# Patient Record
Sex: Female | Born: 1950 | Race: White | Hispanic: No | Marital: Married | Smoking: Former smoker
Health system: Southern US, Community
[De-identification: ages and names within clinical notes are randomized; demographics above are authoritative.]

## PROBLEM LIST (undated history)

## (undated) DIAGNOSIS — I10 Essential (primary) hypertension: Secondary | ICD-10-CM

## (undated) DIAGNOSIS — M5430 Sciatica, unspecified side: Secondary | ICD-10-CM

## (undated) DIAGNOSIS — K219 Gastro-esophageal reflux disease without esophagitis: Secondary | ICD-10-CM

## (undated) DIAGNOSIS — M5136 Other intervertebral disc degeneration, lumbar region: Secondary | ICD-10-CM

## (undated) DIAGNOSIS — Z8489 Family history of other specified conditions: Secondary | ICD-10-CM

## (undated) DIAGNOSIS — M51369 Other intervertebral disc degeneration, lumbar region without mention of lumbar back pain or lower extremity pain: Secondary | ICD-10-CM

## (undated) DIAGNOSIS — N814 Uterovaginal prolapse, unspecified: Secondary | ICD-10-CM

## (undated) HISTORY — PX: ABDOMINAL HYSTERECTOMY: SHX81

## (undated) HISTORY — DX: Sciatica, unspecified side: M54.30

## (undated) HISTORY — PX: COLONOSCOPY: SHX174

## (undated) HISTORY — DX: Other intervertebral disc degeneration, lumbar region without mention of lumbar back pain or lower extremity pain: M51.369

## (undated) HISTORY — PX: OOPHORECTOMY: SHX86

## (undated) HISTORY — DX: Essential (primary) hypertension: I10

## (undated) HISTORY — PX: OVARIAN CYST REMOVAL: SHX89

## (undated) HISTORY — PX: TOTAL ABDOMINAL HYSTERECTOMY W/ BILATERAL SALPINGOOPHORECTOMY: SHX83

## (undated) HISTORY — PX: TUBAL LIGATION: SHX77

## (undated) HISTORY — DX: Other intervertebral disc degeneration, lumbar region: M51.36

---

## 2008-06-17 ENCOUNTER — Ambulatory Visit: Payer: Self-pay | Admitting: Internal Medicine

## 2008-12-22 ENCOUNTER — Ambulatory Visit: Payer: Self-pay | Admitting: Family Medicine

## 2009-01-25 ENCOUNTER — Ambulatory Visit: Payer: Self-pay | Admitting: Family Medicine

## 2011-02-02 ENCOUNTER — Ambulatory Visit: Payer: Self-pay | Admitting: Family Medicine

## 2011-05-16 HISTORY — PX: CYSTOSCOPY: SUR368

## 2012-04-25 ENCOUNTER — Ambulatory Visit: Payer: Self-pay | Admitting: Family Medicine

## 2012-10-10 ENCOUNTER — Ambulatory Visit: Payer: Self-pay

## 2012-11-04 ENCOUNTER — Ambulatory Visit: Payer: Self-pay | Admitting: Emergency Medicine

## 2013-07-09 ENCOUNTER — Ambulatory Visit: Payer: Self-pay | Admitting: Anesthesiology

## 2013-08-20 ENCOUNTER — Ambulatory Visit: Payer: Self-pay | Admitting: Family Medicine

## 2014-01-05 LAB — HM PAP SMEAR: HM PAP: NORMAL

## 2014-01-15 ENCOUNTER — Ambulatory Visit: Payer: Self-pay | Admitting: Family Medicine

## 2014-01-21 ENCOUNTER — Ambulatory Visit: Payer: Self-pay | Admitting: Family Medicine

## 2014-03-03 LAB — HM COLONOSCOPY

## 2014-09-05 NOTE — H&P (Signed)
PATIENT NAME:  Makayla [name], Makayla [name] MR#:  [id number] DATE OF BIRTH:  1950/09/24  DATE OF ADMISSION:  07/09/2013  CHIEF COMPLAINT: Cervical neck pain and bilateral shoulder pain.   PROCEDURES: None.   HISTORY OF PRESENT ILLNESS: [name] is a pleasant 64 year old white female with long-standing history of cervical neck pain for the past 15 years that has gradually gotten worse. She has been referred for evaluation and management and complains of pain that has gradually gotten worse in intensity to the point she sought medical attention. It does not appear to be influenced by time of day and is aggravated by bending, lifting, sitting, and working. Alleviating factors include lying down and medication management, for which she takes ibuprofen occasionally 800 mg t.i.d. p.r.n. She has associated spasms in the bilateral cervical region and trapezius region, and occasionally paroxysmal neck jerking sensations when she is in a prolonged seated position. The pain is described as an aching, annoying, burning, constant, pain that is pressure-like in sensation, but does not appear to radiate into the lower extremities other than some left fifth finger weakness. The pain is tiring and uncomfortable, and she has had a previous MRI that shows multilevel degenerative disk disease with some spur formation noted at multiple levels, most notably at C4-C5 with a disk osteophyte, C5-C6 with a right paracentral disk osteophyte, and C6-C7 with a left lateral disk osteophyte.   PAST MEDICAL HISTORY: Significant for daily aspirin and high blood pressure. Negative for pulmonary, neurologic, psychologic GI. GU with a history of blood in the urine, without significant problems now.   SOCIAL HISTORY: She is married with 4 children. Quit smoking 5 years ago. She works Investment banker, corporate houses.   PAST SURGICAL HISTORY: Includes a tubal ligation and ovarian cystectomy.   CURRENT MEDICATIONS: Include lisinopril, aspirin,  ibuprofen.   PHYSICAL EXAMINATION:  VITAL SIGNS: Temperature 98, blood pressure 121/85, pulse 74, regular respirations of 14, O2 sat 96%.  HEART: Regular rate and rhythm.  LUNGS: Clear to auscultation.  MUSCULOSKELETAL: Inspection of the neck reveals some mild tenderness with atlantooccipital flexion and extension, but worse with lateral adduction to the left and right. She has some bilateral paraspinous muscle tenderness in the cervical region as well as into the trapezius, but no noted trigger points. Strength appears to be well preserved throughout the upper extremities to flexion and extension of the biceps, triceps, wrists, and lumbricals of the hands. Her hand grasp strength appears to be appropriate. Sensation appears to be grossly intact.   ASSESSMENT: 1.  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  2.  Hypertension, on aspirin.   PLAN:  1.  I had a long discussion with Ms. Mankins, and I think she would be a candidate for cervical epidural steroid injections. We have gone over the risks and benefits of the procedure with her in full detail. I have talked to her about the benefits of the injection, that she is likely to receive possibly 50% to 75% with injection, though  some people do not receive any relief at all. She understands this. We will give her some literature on it as well, and I have answered all her questions today. We will have her do this at the next available date. I am going to have her discontinue her aspirin in the meantime. We will also write her a prescription for Celebrex 200 mg to be taken 1 tablet once a day for the next month to see if this can  give her some relief with her pain. She is to discontinue the ibuprofen or Naprosyn in the meantime as well.  2.  We have talked about options regarding physical therapy, and we may consider this as well.    ____________________________ Alvina Filbert. Andree Elk, MD jga:jcm D: 07/09/2013  15:59:35 ET T: 07/09/2013 16:45:05 ET JOB#: [id number]  cc: Alvina Filbert. Andree Elk, MD, <Dictator> [place], MD Alvina Filbert ADAMS MD ELECTRONICALLY SIGNED 07/11/2013 13:12

## 2015-04-13 ENCOUNTER — Other Ambulatory Visit: Payer: Self-pay | Admitting: Family Medicine

## 2015-04-13 DIAGNOSIS — Z1231 Encounter for screening mammogram for malignant neoplasm of breast: Secondary | ICD-10-CM

## 2015-04-21 ENCOUNTER — Ambulatory Visit
Admission: RE | Admit: 2015-04-21 | Discharge: 2015-04-21 | Disposition: A | Discharge status: Home / Self Care | Payer: BC Managed Care – PPO | Source: Ambulatory Visit | Attending: Family Medicine | Admitting: Family Medicine

## 2015-04-21 DIAGNOSIS — Z1231 Encounter for screening mammogram for malignant neoplasm of breast: Secondary | ICD-10-CM | POA: Insufficient documentation

## 2015-06-04 ENCOUNTER — Other Ambulatory Visit: Payer: Self-pay | Admitting: Physical Medicine and Rehabilitation

## 2015-06-04 DIAGNOSIS — M5416 Radiculopathy, lumbar region: Secondary | ICD-10-CM

## 2015-06-22 ENCOUNTER — Ambulatory Visit
Admission: RE | Admit: 2015-06-22 | Discharge: 2015-06-22 | Disposition: A | Discharge status: Home / Self Care | Payer: BC Managed Care – PPO | Source: Ambulatory Visit | Attending: Physical Medicine and Rehabilitation | Admitting: Physical Medicine and Rehabilitation

## 2015-06-22 DIAGNOSIS — M5416 Radiculopathy, lumbar region: Secondary | ICD-10-CM | POA: Diagnosis present

## 2015-06-22 DIAGNOSIS — M5136 Other intervertebral disc degeneration, lumbar region: Secondary | ICD-10-CM | POA: Diagnosis not present

## 2015-06-22 DIAGNOSIS — M4726 Other spondylosis with radiculopathy, lumbar region: Secondary | ICD-10-CM | POA: Diagnosis not present

## 2016-02-28 ENCOUNTER — Other Ambulatory Visit: Payer: Self-pay | Admitting: Family Medicine

## 2016-02-28 DIAGNOSIS — Z1231 Encounter for screening mammogram for malignant neoplasm of breast: Secondary | ICD-10-CM

## 2016-04-24 ENCOUNTER — Ambulatory Visit
Admission: RE | Admit: 2016-04-24 | Discharge: 2016-04-24 | Disposition: A | Discharge status: Home / Self Care | Payer: BC Managed Care – PPO | Source: Ambulatory Visit | Attending: Family Medicine | Admitting: Family Medicine

## 2016-04-24 DIAGNOSIS — Z1231 Encounter for screening mammogram for malignant neoplasm of breast: Secondary | ICD-10-CM | POA: Insufficient documentation

## 2017-03-23 ENCOUNTER — Ambulatory Visit: Payer: BC Managed Care – PPO | Admitting: Family Medicine

## 2017-03-23 ENCOUNTER — Encounter: Payer: Self-pay | Admitting: Family Medicine

## 2017-03-23 VITALS — BP 129/90 | HR 71 | Temp 98.0°F | Ht 66.0 in | Wt 162.2 lb

## 2017-03-23 DIAGNOSIS — M545 Low back pain: Secondary | ICD-10-CM

## 2017-03-23 DIAGNOSIS — Z1231 Encounter for screening mammogram for malignant neoplasm of breast: Secondary | ICD-10-CM | POA: Diagnosis not present

## 2017-03-23 DIAGNOSIS — I10 Essential (primary) hypertension: Secondary | ICD-10-CM

## 2017-03-23 DIAGNOSIS — M5441 Lumbago with sciatica, right side: Secondary | ICD-10-CM

## 2017-03-23 DIAGNOSIS — G8929 Other chronic pain: Secondary | ICD-10-CM

## 2017-03-23 DIAGNOSIS — Z1239 Encounter for other screening for malignant neoplasm of breast: Secondary | ICD-10-CM

## 2017-03-23 DIAGNOSIS — I129 Hypertensive chronic kidney disease with stage 1 through stage 4 chronic kidney disease, or unspecified chronic kidney disease: Secondary | ICD-10-CM | POA: Insufficient documentation

## 2017-03-23 NOTE — Assessment & Plan Note (Signed)
Discussed cymbalta and lidocaine patches. Stretches given. Continue to follow with Dr. Sharlet Salina. Call with any concerns.

## 2017-03-23 NOTE — Patient Instructions (Addendum)
Trochanteric Bursitis Rehab Ask your health care provider which exercises are safe for you. Do exercises exactly as told by your health care provider and adjust them as directed. It is normal to feel mild stretching, pulling, tightness, or discomfort as you do these exercises, but you should stop right away if you feel sudden pain or your pain gets worse.Do not begin these exercises until told by your health care provider. Stretching exercises These exercises warm up your muscles and joints and improve the movement and flexibility of your hip. These exercises also help to relieve pain and stiffness. Exercise A: Iliotibial band stretch  1. Lie on your side with your left / right leg in the top position. 2. Bend your left / right knee and grab your ankle. 3. Slowly bring your knee back so your thigh is behind your body. 4. Slowly lower your knee toward the floor until you feel a gentle stretch on the outside of your left / right thigh. If you do not feel a stretch and your knee will not fall farther, place the heel of your other foot on top of your outer knee and pull your thigh down farther. 5. Hold this position for __________ seconds. 6. Slowly return to the starting position. Repeat __________ times. Complete this exercise __________ times a day. Strengthening exercises These exercises build strength and endurance in your hip and pelvis. Endurance is the ability to use your muscles for a long time, even after they get tired. Exercise B: Bridge ( hip extensors) 1. Lie on your back on a firm surface with your knees bent and your feet flat on the floor. 2. Tighten your buttocks muscles and lift your buttocks off the floor until your trunk is level with your thighs. You should feel the muscles working in your buttocks and the back of your thighs. If this exercise is too easy, try doing it with your arms crossed over your chest. 3. Hold this position for __________ seconds. 4. Slowly return to the  starting position. 5. Let your muscles relax completely between repetitions. Repeat __________ times. Complete this exercise __________ times a day. Exercise C: Squats ( knee extensors and  quadriceps) 1. Stand in front of a table, with your feet and knees pointing straight ahead. You may rest your hands on the table for balance but not for support. 2. Slowly bend your knees and lower your hips like you are going to sit in a chair. ? Keep your weight over your heels, not over your toes. ? Keep your lower legs upright so they are parallel with the table legs. ? Do not let your hips go lower than your knees. ? Do not bend lower than told by your health care provider. ? If your hip pain increases, do not bend as low. 3. Hold this position for __________ seconds. 4. Slowly push with your legs to return to standing. Do not use your hands to pull yourself to standing. Repeat __________ times. Complete this exercise __________ times a day. Exercise D: Hip hike 1. Stand sideways on a bottom step. Stand on your left / right leg with your other foot unsupported next to the step. You can hold onto the railing or wall if needed for balance. 2. Keeping your knees straight and your torso square, lift your left / right hip up toward the ceiling. 3. Hold this position for __________ seconds. 4. Slowly let your left / right hip lower toward the floor, past the starting position. Your foot   should get closer to the floor. Do not lean or bend your knees. Repeat __________ times. Complete this exercise __________ times a day. Exercise E: Single leg stand 1. Stand near a counter or door frame that you can hold onto for balance as needed. It is helpful to stand in front of a mirror for this exercise so you can watch your hip. 2. Squeeze your left / right buttock muscles then lift up your other foot. Do not let your left / right hip push out to the side. 3. Hold this position for __________ seconds. Repeat  __________ times. Complete this exercise __________ times a day. This information is not intended to replace advice given to you by your health care provider. Make sure you discuss any questions you have with your health care provider. Document Released: 06/08/2004 Document Revised: 01/06/2016 Document Reviewed: 04/16/2015 Elsevier Interactive Patient Education  2018 [employer].  Sciatica Rehab Ask your health care provider which exercises are safe for you. Do exercises exactly as told by your health care provider and adjust them as directed. It is normal to feel mild stretching, pulling, tightness, or discomfort as you do these exercises, but you should stop right away if you feel sudden pain or your pain gets worse.Do not begin these exercises until told by your health care provider. Stretching and range of motion exercises These exercises warm up your muscles and joints and improve the movement and flexibility of your hips and your back. These exercises also help to relieve pain, numbness, and tingling. Exercise A: Sciatic nerve glide 7. Sit in a chair with your head facing down toward your chest. Place your hands behind your back. Let your shoulders slump forward. 8. Slowly straighten one of your knees while you tilt your head back as if you are looking toward the ceiling. Only straighten your leg as far as you can without making your symptoms worse. 9. Hold for __________ seconds. 10. Slowly return to the starting position. 11. Repeat with your other leg. Repeat __________ times. Complete this exercise __________ times a day. Exercise B: Knee to chest with hip adduction and internal rotation  1. Lie on your back on a firm surface with both legs straight. 2. Bend one of your knees and move it up toward your chest until you feel a gentle stretch in your lower back and buttock. Then, move your knee toward the shoulder that is on the opposite side from your leg. ? Hold your leg in this  position by holding onto the front of your knee. 3. Hold for __________ seconds. 4. Slowly return to the starting position. 5. Repeat with your other leg. Repeat __________ times. Complete this exercise __________ times a day. Exercise C: Prone extension on elbows  5. Lie on your abdomen on a firm surface. A bed may be too soft for this exercise. 6. Prop yourself up on your elbows. 7. Use your arms to help lift your chest up until you feel a gentle stretch in your abdomen and your lower back. ? This will place some of your body weight on your elbows. If this is uncomfortable, try stacking pillows under your chest. ? Your hips should stay down, against the surface that you are lying on. Keep your hip and back muscles relaxed. 8. Hold for __________ seconds. 9. Slowly relax your upper body and return to the starting position. Repeat __________ times. Complete this exercise __________ times a day. Strengthening exercises These exercises build strength and endurance in your  back. Endurance is the ability to use your muscles for a long time, even after they get tired. Exercise D: Pelvic tilt 1. Lie on your back on a firm surface. Bend your knees and keep your feet flat. 2. Tense your abdominal muscles. Tip your pelvis up toward the ceiling and flatten your lower back into the floor. ? To help with this exercise, you may place a small towel under your lower back and try to push your back into the towel. 3. Hold for __________ seconds. 4. Let your muscles relax completely before you repeat this exercise. Repeat __________ times. Complete this exercise __________ times a day. Exercise E: Alternating arm and leg raises  1. Get on your hands and knees on a firm surface. If you are on a hard floor, you may want to use padding to cushion your knees, such as an exercise mat. 2. Line up your arms and legs. Your hands should be below your shoulders, and your knees should be below your hips. 3. Lift your  left leg behind you. At the same time, raise your right arm and straighten it in front of you. ? Do not lift your leg higher than your hip. ? Do not lift your arm higher than your shoulder. ? Keep your abdominal and back muscles tight. ? Keep your hips facing the ground. ? Do not arch your back. ? Keep your balance carefully, and do not hold your breath. 4. Hold for __________ seconds. 5. Slowly return to the starting position and repeat with your right leg and your left arm. Repeat __________ times. Complete this exercise __________ times a day. Posture and body mechanics  Body mechanics refers to the movements and positions of your body while you do your daily activities. Posture is part of body mechanics. Good posture and healthy body mechanics can help to relieve stress in your body's tissues and joints. Good posture means that your spine is in its natural S-curve position (your spine is neutral), your shoulders are pulled back slightly, and your head is not tipped forward. The following are general guidelines for applying improved posture and body mechanics to your everyday activities. Standing   When standing, keep your spine neutral and your feet about hip-width apart. Keep a slight bend in your knees. Your ears, shoulders, and hips should line up.  When you do a task in which you stand in one place for a long time, place one foot up on a stable object that is 2-4 inches (5-10 cm) high, such as a footstool. This helps keep your spine neutral. Sitting   When sitting, keep your spine neutral and keep your feet flat on the floor. Use a footrest, if necessary, and keep your thighs parallel to the floor. Avoid rounding your shoulders, and avoid tilting your head forward.  When working at a desk or a computer, keep your desk at a height where your hands are slightly lower than your elbows. Slide your chair under your desk so you are close enough to maintain good posture.  When working at a  computer, place your monitor at a height where you are looking straight ahead and you do not have to tilt your head forward or downward to look at the screen. Resting   When lying down and resting, avoid positions that are most painful for you.  If you have pain with activities such as sitting, bending, stooping, or squatting (flexion-based activities), lie in a position in which your body does not bend  very much. For example, avoid curling up on your side with your arms and knees near your chest (fetal position).  If you have pain with activities such as standing for a long time or reaching with your arms (extension-based activities), lie with your spine in a neutral position and bend your knees slightly. Try the following positions: ? Lying on your side with a pillow between your knees. ? Lying on your back with a pillow under your knees. Lifting   When lifting objects, keep your feet at least shoulder-width apart and tighten your abdominal muscles.  Bend your knees and hips and keep your spine neutral. It is important to lift using the strength of your legs, not your back. Do not lock your knees straight out.  Always ask for help to lift heavy or awkward objects. This information is not intended to replace advice given to you by your health care provider. Make sure you discuss any questions you have with your health care provider. Document Released: 05/01/2005 Document Revised: 01/06/2016 Document Reviewed: 01/15/2015 Elsevier Interactive Patient Education  2018 [employer]. Duloxetine delayed-release capsules What is this medicine? DULOXETINE (doo LOX e teen) is used to treat depression, anxiety, and different types of chronic pain. This medicine may be used for other purposes; ask your health care provider or pharmacist if you have questions. COMMON BRAND NAME(S): Cymbalta, Irenka What should I tell my health care provider before I take this medicine? They need to know if you have  any of these conditions: -bipolar disorder or a family history of bipolar disorder -glaucoma -kidney disease -liver disease -suicidal thoughts or a previous suicide attempt -taken medicines called MAOIs like Carbex, Eldepryl, Marplan, Nardil, and Parnate within 14 days -an unusual reaction to duloxetine, other medicines, foods, dyes, or preservatives -pregnant or trying to get pregnant -breast-feeding How should I use this medicine? Take this medicine by mouth with a glass of water. Follow the directions on the prescription label. Do not cut, crush or chew this medicine. You can take this medicine with or without food. Take your medicine at regular intervals. Do not take your medicine more often than directed. Do not stop taking this medicine suddenly except upon the advice of your doctor. Stopping this medicine too quickly may cause serious side effects or your condition may worsen. A special MedGuide will be given to you by the pharmacist with each prescription and refill. Be sure to read this information carefully each time. Talk to your pediatrician regarding the use of this medicine in children. While this drug may be prescribed for children as young as 10 years of age for selected conditions, precautions do apply. Overdosage: If you think you have taken too much of this medicine contact a poison control center or emergency room at once. NOTE: This medicine is only for you. Do not share this medicine with others. What if I miss a dose? If you miss a dose, take it as soon as you can. If it is almost time for your next dose, take only that dose. Do not take double or extra doses. What may interact with this medicine? Do not take this medicine with any of the following medications: -desvenlafaxine -levomilnacipran -linezolid -MAOIs like Carbex, Eldepryl, Marplan, Nardil, and Parnate -methylene blue (injected into a vein) -milnacipran -thioridazine -venlafaxine This medicine may also  interact with the following medications: -alcohol -amphetamines -aspirin and aspirin-like medicines -certain antibiotics like ciprofloxacin and enoxacin -certain medicines for blood pressure, heart disease, irregular heart beat -certain medicines  for depression, anxiety, or psychotic disturbances -certain medicines for migraine headache like almotriptan, eletriptan, frovatriptan, naratriptan, rizatriptan, sumatriptan, zolmitriptan -certain medicines that treat or prevent blood clots like warfarin, enoxaparin, and dalteparin -cimetidine -fentanyl -lithium -NSAIDS, medicines for pain and inflammation, like ibuprofen or naproxen -phentermine -procarbazine -rasagiline -sibutramine -St. John's wort -theophylline -tramadol -tryptophan This list may not describe all possible interactions. Give your health care provider a list of all the medicines, herbs, non-prescription drugs, or dietary supplements you use. Also tell them if you smoke, drink alcohol, or use illegal drugs. Some items may interact with your medicine. What should I watch for while using this medicine? Tell your doctor if your symptoms do not get better or if they get worse. Visit your doctor or health care professional for regular checks on your progress. Because it may take several weeks to see the full effects of this medicine, it is important to continue your treatment as prescribed by your doctor. Patients and their families should watch out for new or worsening thoughts of suicide or depression. Also watch out for sudden changes in feelings such as feeling anxious, agitated, panicky, irritable, hostile, aggressive, impulsive, severely restless, overly excited and hyperactive, or not being able to sleep. If this happens, especially at the beginning of treatment or after a change in dose, call your health care professional. Dennis Bast may get drowsy or dizzy. Do not drive, use machinery, or do anything that needs mental alertness until  you know how this medicine affects you. Do not stand or sit up quickly, especially if you are an older patient. This reduces the risk of dizzy or fainting spells. Alcohol may interfere with the effect of this medicine. Avoid alcoholic drinks. This medicine can cause an increase in blood pressure. This medicine can also cause a sudden drop in your blood pressure, which may make you feel faint and increase the chance of a fall. These effects are most common when you first start the medicine or when the dose is increased, or during use of other medicines that can cause a sudden drop in blood pressure. Check with your doctor for instructions on monitoring your blood pressure while taking this medicine. Your mouth may get dry. Chewing sugarless gum or sucking hard candy, and drinking plenty of water may help. Contact your doctor if the problem does not go away or is severe. What side effects may I notice from receiving this medicine? Side effects that you should report to your doctor or health care professional as soon as possible: -allergic reactions like skin rash, itching or hives, swelling of the face, lips, or tongue -anxious -breathing problems -confusion -changes in vision -chest pain -confusion -elevated mood, decreased need for sleep, racing thoughts, impulsive behavior -eye pain -fast, irregular heartbeat -feeling faint or lightheaded, falls -feeling agitated, angry, or irritable -hallucination, loss of contact with reality -high blood pressure -loss of balance or coordination -palpitations -redness, blistering, peeling or loosening of the skin, including inside the mouth -restlessness, pacing, inability to keep still -seizures -stiff muscles -suicidal thoughts or other mood changes -trouble passing urine or change in the amount of urine -trouble sleeping -unusual bleeding or bruising -unusually weak or tired -vomiting -yellowing of the eyes or skin Side effects that usually do  not require medical attention (report to your doctor or health care professional if they continue or are bothersome): -change in sex drive or performance -change in appetite or weight -constipation -dizziness -dry mouth -headache -increased sweating -nausea -tired This list may not describe  all possible side effects. Call your doctor for medical advice about side effects. You may report side effects to FDA at 1-800-FDA-1088. Where should I keep my medicine? Keep out of the reach of children. Store at room temperature between 20 and 25 degrees C (68 to 77 degrees F). Throw away any unused medicine after the expiration date. NOTE: This sheet is a summary. It may not cover all possible information. If you have questions about this medicine, talk to your doctor, pharmacist, or health care provider.  2018 Elsevier/Gold Standard (2015-09-30 18:16:03)

## 2017-03-23 NOTE — Assessment & Plan Note (Signed)
Under good control. Doesn't need refill today. Continue current regimen. Continue to monitor. Call with any concerns.

## 2017-03-23 NOTE — Progress Notes (Signed)
BP 129/90 (BP Location: Left Arm, Patient Position: Sitting, Cuff Size: Normal)   Pulse 71   Temp 98 F (36.7 C)   Ht 5\' 6"  (1.676 m)   Wt 162 lb 4 oz (73.6 kg)   SpO2 98%   BMI 26.19 kg/m    Subjective:    Patient ID: KALISA GIRTMAN, female    DOB: [birth year], 66 y.o.   MRN: [id number]  HPI: TULEEN MANDELBAUM is a 66 y.o. female who presents today to establish care.   Chief Complaint  Patient presents with  . Establish Care  . Hypertension   HYPERTENSION Hypertension status: controlled  Satisfied with current treatment? yes Duration of hypertension: chronic BP monitoring frequency:  not checking BP medication side effects:  no Medication compliance: excellent compliance Previous BP meds: lisinopril Aspirin: no Recurrent headaches: no Visual changes: no Palpitations: no Dyspnea: no Chest pain: no Lower extremity edema: no Dizzy/lightheaded: no  Follows with Dr. Sharlet Salina for low back pain and trochanteric bursitis. Has had several injections. Has not done PT in a while on her back and never on her hip. Tried gabapentin but it was too sedating. Doing pretty well with her pain today, but it can be pretty severe. No other concerns or complaints at this time.   Active Ambulatory Problems    Diagnosis Date Noted  . Hypertension   . Chronic low back pain 03/23/2017   Resolved Ambulatory Problems    Diagnosis Date Noted  . No Resolved Ambulatory Problems   Past Medical History:  Diagnosis Date  . DDD (degenerative disc disease), lumbar   . Hypertension   . Sciatica    Past Surgical History:  Procedure Laterality Date  . OVARIAN CYST REMOVAL Left   . TUBAL LIGATION     X 2   Outpatient Encounter Medications as of 03/23/2017  Medication Sig  . lisinopril (PRINIVIL,ZESTRIL) 10 MG tablet Take by mouth.  . [DISCONTINUED] aspirin 81 MG tablet Take by mouth.  . [DISCONTINUED] gabapentin (NEURONTIN) 300 MG capsule 1 po tid  . [DISCONTINUED] Multiple  Vitamins-Minerals (MULTIVITAMIN ADULTS PO) Take by mouth.  . [DISCONTINUED] ibuprofen (ADVIL,MOTRIN) 600 MG tablet Take by mouth.   No facility-administered encounter medications on file as of 03/23/2017.    No Known Allergies  Social History   Socioeconomic History  . Marital status: Married    Spouse name: None  . Number of children: None  . Years of education: None  . Highest education level: None  Social Needs  . Financial resource strain: None  . Food insecurity - worry: None  . Food insecurity - inability: None  . Transportation needs - medical: None  . Transportation needs - non-medical: None  Occupational History  . None  Tobacco Use  . Smoking status: Former Smoker    Last attempt to quit: 03/23/2002    Years since quitting: 15.0  . Smokeless tobacco: Never Used  Substance and Sexual Activity  . Alcohol use: No    Frequency: Never  . Drug use: No  . Sexual activity: Not Currently  Other Topics Concern  . None  Social History Narrative  . None   Family History  Problem Relation Age of Onset  . Breast cancer Sister 6  . Stroke Mother   . Cancer Brother        Melanoma- went to liver  . Cancer Brother        Liver    Review of Systems  Constitutional: Negative.   Respiratory:  Negative.   Cardiovascular: Negative.   Psychiatric/Behavioral: Negative.     Per HPI unless specifically indicated above     Objective:    BP 129/90 (BP Location: Left Arm, Patient Position: Sitting, Cuff Size: Normal)   Pulse 71   Temp 98 F (36.7 C)   Ht 5\' 6"  (1.676 m)   Wt 162 lb 4 oz (73.6 kg)   SpO2 98%   BMI 26.19 kg/m   Wt Readings from Last 3 Encounters:  03/23/17 162 lb 4 oz (73.6 kg)    Physical Exam  Constitutional: She is oriented to person, place, and time. She appears well-developed and well-nourished. No distress.  HENT:  Head: Normocephalic and atraumatic.  Right Ear: Hearing normal.  Left Ear: Hearing normal.  Nose: Nose normal.  Eyes:  Conjunctivae and lids are normal. Right eye exhibits no discharge. Left eye exhibits no discharge. No scleral icterus.  Cardiovascular: Normal rate, regular rhythm, normal heart sounds and intact distal pulses. Exam reveals no gallop and no friction rub.  No murmur heard. Pulmonary/Chest: Effort normal and breath sounds normal. No respiratory distress. She has no wheezes. She has no rales. She exhibits no tenderness.  Musculoskeletal: She exhibits tenderness (to piriformis and trochanteric bursa on the R). She exhibits no edema or deformity.  Neurological: She is alert and oriented to person, place, and time.  Skin: Skin is warm, dry and intact. No rash noted. She is not diaphoretic. No erythema. No pallor.  Psychiatric: She has a normal mood and affect. Her speech is normal and behavior is normal. Judgment and thought content normal. Cognition and memory are normal.    Results for orders placed or performed in visit on 03/23/17  HM PAP SMEAR  Result Value Ref Range   HM Pap smear normal   HM COLONOSCOPY  Result Value Ref Range   HM Colonoscopy See Report (in chart) See Report (in chart), Patient Reported      Assessment & Plan:   Problem List Items Addressed This Visit      Cardiovascular and Mediastinum   Hypertension - Primary    Under good control. Doesn't need refill today. Continue current regimen. Continue to monitor. Call with any concerns.       Relevant Medications   lisinopril (PRINIVIL,ZESTRIL) 10 MG tablet     Other   Chronic low back pain    Discussed cymbalta and lidocaine patches. Stretches given. Continue to follow with Dr. Sharlet Salina. Call with any concerns.        Other Visit Diagnoses    Screening for breast cancer       Mammogram due in December. Order placed today.   Relevant Orders   MM DIGITAL SCREENING BILATERAL       Follow up plan: Return for After 05/26/17 for Welcome to Medicare.

## 2017-04-26 ENCOUNTER — Ambulatory Visit
Admission: RE | Admit: 2017-04-26 | Discharge: 2017-04-26 | Disposition: A | Discharge status: Home / Self Care | Payer: BC Managed Care – PPO | Source: Ambulatory Visit | Attending: Family Medicine | Admitting: Family Medicine

## 2017-04-26 DIAGNOSIS — Z1239 Encounter for other screening for malignant neoplasm of breast: Secondary | ICD-10-CM

## 2017-04-26 DIAGNOSIS — Z1231 Encounter for screening mammogram for malignant neoplasm of breast: Secondary | ICD-10-CM | POA: Diagnosis present

## 2017-06-19 ENCOUNTER — Ambulatory Visit (INDEPENDENT_AMBULATORY_CARE_PROVIDER_SITE_OTHER): Payer: PPO | Admitting: Family Medicine

## 2017-06-19 ENCOUNTER — Encounter: Payer: Self-pay | Admitting: Family Medicine

## 2017-06-19 VITALS — BP 119/82 | HR 76 | Temp 97.5°F | Ht 66.0 in | Wt 165.4 lb

## 2017-06-19 DIAGNOSIS — Z0000 Encounter for general adult medical examination without abnormal findings: Secondary | ICD-10-CM

## 2017-06-19 DIAGNOSIS — Z87891 Personal history of nicotine dependence: Secondary | ICD-10-CM

## 2017-06-19 DIAGNOSIS — Z131 Encounter for screening for diabetes mellitus: Secondary | ICD-10-CM | POA: Diagnosis not present

## 2017-06-19 DIAGNOSIS — I10 Essential (primary) hypertension: Secondary | ICD-10-CM

## 2017-06-19 DIAGNOSIS — R5383 Other fatigue: Secondary | ICD-10-CM

## 2017-06-19 DIAGNOSIS — Z136 Encounter for screening for cardiovascular disorders: Secondary | ICD-10-CM

## 2017-06-19 DIAGNOSIS — Z13820 Encounter for screening for osteoporosis: Secondary | ICD-10-CM | POA: Diagnosis not present

## 2017-06-19 DIAGNOSIS — Z23 Encounter for immunization: Secondary | ICD-10-CM | POA: Diagnosis not present

## 2017-06-19 DIAGNOSIS — Z1159 Encounter for screening for other viral diseases: Secondary | ICD-10-CM

## 2017-06-19 DIAGNOSIS — Z124 Encounter for screening for malignant neoplasm of cervix: Secondary | ICD-10-CM | POA: Diagnosis not present

## 2017-06-19 DIAGNOSIS — Z13220 Encounter for screening for lipoid disorders: Secondary | ICD-10-CM

## 2017-06-19 LAB — UA/M W/RFLX CULTURE, ROUTINE
BILIRUBIN UA: NEGATIVE
Glucose, UA: NEGATIVE
KETONES UA: NEGATIVE
Nitrite, UA: NEGATIVE
PROTEIN UA: NEGATIVE
SPEC GRAV UA: 1.025 (ref 1.005–1.030)
Urobilinogen, Ur: 1 mg/dL (ref 0.2–1.0)
pH, UA: 6.5 (ref 5.0–7.5)

## 2017-06-19 LAB — MICROSCOPIC EXAMINATION: BACTERIA UA: NONE SEEN

## 2017-06-19 LAB — MICROALBUMIN, URINE WAIVED
Creatinine, Urine Waived: 200 mg/dL (ref 10–300)
Microalb, Ur Waived: 30 mg/L — ABNORMAL HIGH (ref 0–19)

## 2017-06-19 LAB — BAYER DCA HB A1C WAIVED: HB A1C (BAYER DCA - WAIVED): 5.3 % (ref <7.0)

## 2017-06-19 MED ORDER — LISINOPRIL 10 MG PO TABS: 10.0000 mg | ORAL_TABLET | Freq: Every day | ORAL | 3 refills | Status: DC

## 2017-06-19 NOTE — Assessment & Plan Note (Addendum)
Quit in 2003, 16 years ago. Not a candidate for lung cancer screening. Checking urine today. Call with any concerns.

## 2017-06-19 NOTE — Patient Instructions (Addendum)
Preventative Services:  AAA screening: N/A Health Risk Assessment and Personalized Prevention Plan: Done today Bone Mass Measurements: Ordered today Breast Cancer Screening: Up to date- due next year CVD Screening: Done today Cervical Cancer Screening: Done today Colon Cancer Screening: Up to date, due in 2025 Depression Screening: Done today Diabetes Screening: Done today Glaucoma Screening: See your eye doctor Hepatitis B vaccine: N/A Hepatitis C screening: Checked today HIV Screening: Declined Flu Vaccine: up to date Lung cancer Screening: N/A Obesity Screening: Done today Pneumonia Vaccines (2): Prevnar given today, due for 2nd shot next year STI Screening: N/A Health Maintenance, Female Adopting a healthy lifestyle and getting preventive care can go a long way to promote health and wellness. Talk with your health care provider about what schedule of regular examinations is right for you. This is a good chance for you to check in with your provider about disease prevention and staying healthy. In between checkups, there are plenty of things you can do on your own. Experts have done a lot of research about which lifestyle changes and preventive measures are most likely to keep you healthy. Ask your health care provider for more information. Weight and diet Eat a healthy diet  Be sure to include plenty of vegetables, fruits, low-fat dairy products, and lean protein.  Do not eat a lot of foods high in solid fats, added sugars, or salt.  Get regular exercise. This is one of the most important things you can do for your health. ? Most adults should exercise for at least 150 minutes each week. The exercise should increase your heart rate and make you sweat (moderate-intensity exercise). ? Most adults should also do strengthening exercises at least twice a week. This is in addition to the moderate-intensity exercise.  Maintain a healthy weight  Body mass index (BMI) is a measurement  that can be used to identify possible weight problems. It estimates body fat based on height and weight. Your health care provider can help determine your BMI and help you achieve or maintain a healthy weight.  For females 29 years of age and older: ? A BMI below 18.5 is considered underweight. ? A BMI of 18.5 to 24.9 is normal. ? A BMI of 25 to 29.9 is considered overweight. ? A BMI of 30 and above is considered obese.  Watch levels of cholesterol and blood lipids  You should start having your blood tested for lipids and cholesterol at 65 years of age, then have this test every 5 years.  You may need to have your cholesterol levels checked more often if: ? Your lipid or cholesterol levels are high. ? You are older than 65 years of age. ? You are at high risk for heart disease.  Cancer screening Lung Cancer  Lung cancer screening is recommended for adults 70-62 years old who are at high risk for lung cancer because of a history of smoking.  A yearly low-dose CT scan of the lungs is recommended for people who: ? Currently smoke. ? Have quit within the past 15 years. ? Have at least a 30-pack-year history of smoking. A pack year is smoking an average of one pack of cigarettes a day for 1 year.  Yearly screening should continue until it has been 15 years since you quit.  Yearly screening should stop if you develop a health problem that would prevent you from having lung cancer treatment.  Breast Cancer  Practice breast self-awareness. This means understanding how your breasts normally appear and  feel.  It also means doing regular breast self-exams. Let your health care provider know about any changes, no matter how small.  If you are in your 20s or 30s, you should have a clinical breast exam (CBE) by a health care provider every 1-3 years as part of a regular health exam.  If you are 64 or older, have a CBE every year. Also consider having a breast X-ray (mammogram) every  year.  If you have a family history of breast cancer, talk to your health care provider about genetic screening.  If you are at high risk for breast cancer, talk to your health care provider about having an MRI and a mammogram every year.  Breast cancer gene (BRCA) assessment is recommended for women who have family members with BRCA-related cancers. BRCA-related cancers include: ? Breast. ? Ovarian. ? Tubal. ? Peritoneal cancers.  Results of the assessment will determine the need for genetic counseling and BRCA1 and BRCA2 testing.  Cervical Cancer Your health care provider may recommend that you be screened regularly for cancer of the pelvic organs (ovaries, uterus, and vagina). This screening involves a pelvic examination, including checking for microscopic changes to the surface of your cervix (Pap test). You may be encouraged to have this screening done every 3 years, beginning at age 87.  For women ages 63-65, health care providers may recommend pelvic exams and Pap testing every 3 years, or they may recommend the Pap and pelvic exam, combined with testing for human papilloma virus (HPV), every 5 years. Some types of HPV increase your risk of cervical cancer. Testing for HPV may also be done on women of any age with unclear Pap test results.  Other health care providers may not recommend any screening for nonpregnant women who are considered low risk for pelvic cancer and who do not have symptoms. Ask your health care provider if a screening pelvic exam is right for you.  If you have had past treatment for cervical cancer or a condition that could lead to cancer, you need Pap tests and screening for cancer for at least 20 years after your treatment. If Pap tests have been discontinued, your risk factors (such as having a new sexual partner) need to be reassessed to determine if screening should resume. Some women have medical problems that increase the chance of getting cervical cancer. In  these cases, your health care provider may recommend more frequent screening and Pap tests.  Colorectal Cancer  This type of cancer can be detected and often prevented.  Routine colorectal cancer screening usually begins at 65 years of age and continues through 65 years of age.  Your health care provider may recommend screening at an earlier age if you have risk factors for colon cancer.  Your health care provider may also recommend using home test kits to check for hidden blood in the stool.  A small camera at the end of a tube can be used to examine your colon directly (sigmoidoscopy or colonoscopy). This is done to check for the earliest forms of colorectal cancer.  Routine screening usually begins at age 76.  Direct examination of the colon should be repeated every 5-10 years through 65 years of age. However, you may need to be screened more often if early forms of precancerous polyps or small growths are found.  Skin Cancer  Check your skin from head to toe regularly.  Tell your health care provider about any new moles or changes in moles, especially if there  is a change in a mole's shape or color.  Also tell your health care provider if you have a mole that is larger than the size of a pencil eraser.  Always use sunscreen. Apply sunscreen liberally and repeatedly throughout the day.  Protect yourself by wearing long sleeves, pants, a wide-brimmed hat, and sunglasses whenever you are outside.  Heart disease, diabetes, and high blood pressure  High blood pressure causes heart disease and increases the risk of stroke. High blood pressure is more likely to develop in: ? People who have blood pressure in the high end of the normal range (130-139/85-89 mm Hg). ? People who are overweight or obese. ? People who are African American.  If you are 54-16 years of age, have your blood pressure checked every 3-5 years. If you are 48 years of age or older, have your blood pressure  checked every year. You should have your blood pressure measured twice-once when you are at a hospital or clinic, and once when you are not at a hospital or clinic. Record the average of the two measurements. To check your blood pressure when you are not at a hospital or clinic, you can use: ? An automated blood pressure machine at a pharmacy. ? A home blood pressure monitor.  If you are between 48 years and 60 years old, ask your health care provider if you should take aspirin to prevent strokes.  Have regular diabetes screenings. This involves taking a blood sample to check your fasting blood sugar level. ? If you are at a normal weight and have a low risk for diabetes, have this test once every three years after 65 years of age. ? If you are overweight and have a high risk for diabetes, consider being tested at a younger age or more often. Preventing infection Hepatitis B  If you have a higher risk for hepatitis B, you should be screened for this virus. You are considered at high risk for hepatitis B if: ? You were born in a country where hepatitis B is common. Ask your health care provider which countries are considered high risk. ? Your parents were born in a high-risk country, and you have not been immunized against hepatitis B (hepatitis B vaccine). ? You have HIV or AIDS. ? You use needles to inject street drugs. ? You live with someone who has hepatitis B. ? You have had sex with someone who has hepatitis B. ? You get hemodialysis treatment. ? You take certain medicines for conditions, including cancer, organ transplantation, and autoimmune conditions.  Hepatitis C  Blood testing is recommended for: ? Everyone born from [birth year] through 1963. ? Anyone with known risk factors for hepatitis C.  Sexually transmitted infections (STIs)  You should be screened for sexually transmitted infections (STIs) including gonorrhea and chlamydia if: ? You are sexually active and are younger than  65 years of age. ? You are older than 65 years of age and your health care provider tells you that you are at risk for this type of infection. ? Your sexual activity has changed since you were last screened and you are at an increased risk for chlamydia or gonorrhea. Ask your health care provider if you are at risk.  If you do not have HIV, but are at risk, it may be recommended that you take a prescription medicine daily to prevent HIV infection. This is called pre-exposure prophylaxis (PrEP). You are considered at risk if: ? You are sexually active and do  not regularly use condoms or know the HIV status of your partner(s). ? You take drugs by injection. ? You are sexually active with a partner who has HIV.  Talk with your health care provider about whether you are at high risk of being infected with HIV. If you choose to begin PrEP, you should first be tested for HIV. You should then be tested every 3 months for as long as you are taking PrEP. Pregnancy  If you are premenopausal and you may become pregnant, ask your health care provider about preconception counseling.  If you may become pregnant, take 400 to 800 micrograms (mcg) of folic acid every day.  If you want to prevent pregnancy, talk to your health care provider about birth control (contraception). Osteoporosis and menopause  Osteoporosis is a disease in which the bones lose minerals and strength with aging. This can result in serious bone fractures. Your risk for osteoporosis can be identified using a bone density scan.  If you are 57 years of age or older, or if you are at risk for osteoporosis and fractures, ask your health care provider if you should be screened.  Ask your health care provider whether you should take a calcium or vitamin D supplement to lower your risk for osteoporosis.  Menopause may have certain physical symptoms and risks.  Hormone replacement therapy may reduce some of these symptoms and risks. Talk to  your health care provider about whether hormone replacement therapy is right for you. Follow these instructions at home:  Schedule regular health, dental, and eye exams.  Stay current with your immunizations.  Do not use any tobacco products including cigarettes, chewing tobacco, or electronic cigarettes.  If you are pregnant, do not drink alcohol.  If you are breastfeeding, limit how much and how often you drink alcohol.  Limit alcohol intake to no more than 1 drink per day for nonpregnant women. One drink equals 12 ounces of beer, 5 ounces of wine, or 1 ounces of hard liquor.  Do not use street drugs.  Do not share needles.  Ask your health care provider for help if you need support or information about quitting drugs.  Tell your health care provider if you often feel depressed.  Tell your health care provider if you have ever been abused or do not feel safe at home. This information is not intended to replace advice given to you by your health care provider. Make sure you discuss any questions you have with your health care provider. Document Released: 11/14/2010 Document Revised: 10/07/2015 Document Reviewed: 02/02/2015 Elsevier Interactive Patient Education  2018 [place] Maintenance for Postmenopausal Women Menopause is a normal process in which your reproductive ability comes to an end. This process happens gradually over a span of months to years, usually between the ages of 87 and 73. Menopause is complete when you have missed 12 consecutive menstrual periods. It is important to talk with your health care provider about some of the most common conditions that affect postmenopausal women, such as heart disease, cancer, and bone loss (osteoporosis). Adopting a healthy lifestyle and getting preventive care can help to promote your health and wellness. Those actions can also lower your chances of developing some of these common conditions. What should I know about  menopause? During menopause, you may experience a number of symptoms, such as:  Moderate-to-severe hot flashes.  Night sweats.  Decrease in sex drive.  Mood swings.  Headaches.  Tiredness.  Irritability.  Memory problems.  Insomnia.  Choosing to treat or not to treat menopausal changes is an individual decision that you make with your health care provider. What should I know about hormone replacement therapy and supplements? Hormone therapy products are effective for treating symptoms that are associated with menopause, such as hot flashes and night sweats. Hormone replacement carries certain risks, especially as you become older. If you are thinking about using estrogen or estrogen with progestin treatments, discuss the benefits and risks with your health care provider. What should I know about heart disease and stroke? Heart disease, heart attack, and stroke become more likely as you age. This may be due, in part, to the hormonal changes that your body experiences during menopause. These can affect how your body processes dietary fats, triglycerides, and cholesterol. Heart attack and stroke are both medical emergencies. There are many things that you can do to help prevent heart disease and stroke:  Have your blood pressure checked at least every 1-2 years. High blood pressure causes heart disease and increases the risk of stroke.  If you are 56-35 years old, ask your health care provider if you should take aspirin to prevent a heart attack or a stroke.  Do not use any tobacco products, including cigarettes, chewing tobacco, or electronic cigarettes. If you need help quitting, ask your health care provider.  It is important to eat a healthy diet and maintain a healthy weight. ? Be sure to include plenty of vegetables, fruits, low-fat dairy products, and lean protein. ? Avoid eating foods that are high in solid fats, added sugars, or salt (sodium).  Get regular exercise. This  is one of the most important things that you can do for your health. ? Try to exercise for at least 150 minutes each week. The type of exercise that you do should increase your heart rate and make you sweat. This is known as moderate-intensity exercise. ? Try to do strengthening exercises at least twice each week. Do these in addition to the moderate-intensity exercise.  Know your numbers.Ask your health care provider to check your cholesterol and your blood glucose. Continue to have your blood tested as directed by your health care provider.  What should I know about cancer screening? There are several types of cancer. Take the following steps to reduce your risk and to catch any cancer development as early as possible. Breast Cancer  Practice breast self-awareness. ? This means understanding how your breasts normally appear and feel. ? It also means doing regular breast self-exams. Let your health care provider know about any changes, no matter how small.  If you are 56 or older, have a clinician do a breast exam (clinical breast exam or CBE) every year. Depending on your age, family history, and medical history, it may be recommended that you also have a yearly breast X-ray (mammogram).  If you have a family history of breast cancer, talk with your health care provider about genetic screening.  If you are at high risk for breast cancer, talk with your health care provider about having an MRI and a mammogram every year.  Breast cancer (BRCA) gene test is recommended for women who have family members with BRCA-related cancers. Results of the assessment will determine the need for genetic counseling and BRCA1 and for BRCA2 testing. BRCA-related cancers include these types: ? Breast. This occurs in males or females. ? Ovarian. ? Tubal. This may also be called fallopian tube cancer. ? Cancer of the abdominal or pelvic lining (peritoneal cancer). ?  Prostate. ? Pancreatic.  Cervical,  Uterine, and Ovarian Cancer Your health care provider may recommend that you be screened regularly for cancer of the pelvic organs. These include your ovaries, uterus, and vagina. This screening involves a pelvic exam, which includes checking for microscopic changes to the surface of your cervix (Pap test).  For women ages 21-65, health care providers may recommend a pelvic exam and a Pap test every three years. For women ages 82-65, they may recommend the Pap test and pelvic exam, combined with testing for human papilloma virus (HPV), every five years. Some types of HPV increase your risk of cervical cancer. Testing for HPV may also be done on women of any age who have unclear Pap test results.  Other health care providers may not recommend any screening for nonpregnant women who are considered low risk for pelvic cancer and have no symptoms. Ask your health care provider if a screening pelvic exam is right for you.  If you have had past treatment for cervical cancer or a condition that could lead to cancer, you need Pap tests and screening for cancer for at least 20 years after your treatment. If Pap tests have been discontinued for you, your risk factors (such as having a new sexual partner) need to be reassessed to determine if you should start having screenings again. Some women have medical problems that increase the chance of getting cervical cancer. In these cases, your health care provider may recommend that you have screening and Pap tests more often.  If you have a family history of uterine cancer or ovarian cancer, talk with your health care provider about genetic screening.  If you have vaginal bleeding after reaching menopause, tell your health care provider.  There are currently no reliable tests available to screen for ovarian cancer.  Lung Cancer Lung cancer screening is recommended for adults 76-62 years old who are at high risk for lung cancer because of a history of smoking. A  yearly low-dose CT scan of the lungs is recommended if you:  Currently smoke.  Have a history of at least 30 pack-years of smoking and you currently smoke or have quit within the past 15 years. A pack-year is smoking an average of one pack of cigarettes per day for one year.  Yearly screening should:  Continue until it has been 15 years since you quit.  Stop if you develop a health problem that would prevent you from having lung cancer treatment.  Colorectal Cancer  This type of cancer can be detected and can often be prevented.  Routine colorectal cancer screening usually begins at age 20 and continues through age 41.  If you have risk factors for colon cancer, your health care provider may recommend that you be screened at an earlier age.  If you have a family history of colorectal cancer, talk with your health care provider about genetic screening.  Your health care provider may also recommend using home test kits to check for hidden blood in your stool.  A small camera at the end of a tube can be used to examine your colon directly (sigmoidoscopy or colonoscopy). This is done to check for the earliest forms of colorectal cancer.  Direct examination of the colon should be repeated every 5-10 years until age 27. However, if early forms of precancerous polyps or small growths are found or if you have a family history or genetic risk for colorectal cancer, you may need to be screened more often.  Skin  Cancer  Check your skin from head to toe regularly.  Monitor any moles. Be sure to tell your health care provider: ? About any new moles or changes in moles, especially if there is a change in a mole's shape or color. ? If you have a mole that is larger than the size of a pencil eraser.  If any of your family members has a history of skin cancer, especially at a young age, talk with your health care provider about genetic screening.  Always use sunscreen. Apply sunscreen liberally  and repeatedly throughout the day.  Whenever you are outside, protect yourself by wearing long sleeves, pants, a wide-brimmed hat, and sunglasses.  What should I know about osteoporosis? Osteoporosis is a condition in which bone destruction happens more quickly than new bone creation. After menopause, you may be at an increased risk for osteoporosis. To help prevent osteoporosis or the bone fractures that can happen because of osteoporosis, the following is recommended:  If you are 39-66 years old, get at least 1,000 mg of calcium and at least 600 mg of vitamin D per day.  If you are older than age 48 but younger than age 49, get at least 1,200 mg of calcium and at least 600 mg of vitamin D per day.  If you are older than age 18, get at least 1,200 mg of calcium and at least 800 mg of vitamin D per day.  Smoking and excessive alcohol intake increase the risk of osteoporosis. Eat foods that are rich in calcium and vitamin D, and do weight-bearing exercises several times each week as directed by your health care provider. What should I know about how menopause affects my mental health? Depression may occur at any age, but it is more common as you become older. Common symptoms of depression include:  Low or sad mood.  Changes in sleep patterns.  Changes in appetite or eating patterns.  Feeling an overall lack of motivation or enjoyment of activities that you previously enjoyed.  Frequent crying spells.  Talk with your health care provider if you think that you are experiencing depression. What should I know about immunizations? It is important that you get and maintain your immunizations. These include:  Tetanus, diphtheria, and pertussis (Tdap) booster vaccine.  Influenza every year before the flu season begins.  Pneumonia vaccine.  Shingles vaccine.  Your health care provider may also recommend other immunizations. This information is not intended to replace advice given to you  by your health care provider. Make sure you discuss any questions you have with your health care provider. Document Released: 06/23/2005 Document Revised: 11/19/2015 Document Reviewed: 02/02/2015 Elsevier Interactive Patient Education  2018 [place]. Pneumococcal Conjugate Vaccine suspension for injection What is this medicine? PNEUMOCOCCAL VACCINE (NEU mo KOK al vak SEEN) is a vaccine used to prevent pneumococcus bacterial infections. These bacteria can cause serious infections like pneumonia, meningitis, and blood infections. This vaccine will lower your chance of getting pneumonia. If you do get pneumonia, it can make your symptoms milder and your illness shorter. This vaccine will not treat an infection and will not cause infection. This vaccine is recommended for infants and young children, adults with certain medical conditions, and adults 74 years or older. This medicine may be used for other purposes; ask your health care provider or pharmacist if you have questions. COMMON BRAND NAME(S): Prevnar, Prevnar 13 What should I tell my health care provider before I take this medicine? They need to know if  you have any of these conditions: -bleeding problems -fever -immune system problems -an unusual or allergic reaction to pneumococcal vaccine, diphtheria toxoid, other vaccines, latex, other medicines, foods, dyes, or preservatives -pregnant or trying to get pregnant -breast-feeding How should I use this medicine? This vaccine is for injection into a muscle. It is given by a health care professional. A copy of Vaccine Information Statements will be given before each vaccination. Read this sheet carefully each time. The sheet may change frequently. Talk to your pediatrician regarding the use of this medicine in children. While this drug may be prescribed for children as young as 40 weeks old for selected conditions, precautions do apply. Overdosage: If you think you have taken too much of  this medicine contact a poison control center or emergency room at once. NOTE: This medicine is only for you. Do not share this medicine with others. What if I miss a dose? It is important not to miss your dose. Call your doctor or health care professional if you are unable to keep an appointment. What may interact with this medicine? -medicines for cancer chemotherapy -medicines that suppress your immune function -steroid medicines like prednisone or cortisone This list may not describe all possible interactions. Give your health care provider a list of all the medicines, herbs, non-prescription drugs, or dietary supplements you use. Also tell them if you smoke, drink alcohol, or use illegal drugs. Some items may interact with your medicine. What should I watch for while using this medicine? Mild fever and pain should go away in 3 days or less. Report any unusual symptoms to your doctor or health care professional. What side effects may I notice from receiving this medicine? Side effects that you should report to your doctor or health care professional as soon as possible: -allergic reactions like skin rash, itching or hives, swelling of the face, lips, or tongue -breathing problems -confused -fast or irregular heartbeat -fever over 102 degrees F -seizures -unusual bleeding or bruising -unusual muscle weakness Side effects that usually do not require medical attention (report to your doctor or health care professional if they continue or are bothersome): -aches and pains -diarrhea -fever of 102 degrees F or less -headache -irritable -loss of appetite -pain, tender at site where injected -trouble sleeping This list may not describe all possible side effects. Call your doctor for medical advice about side effects. You may report side effects to FDA at 1-800-FDA-1088. Where should I keep my medicine? This does not apply. This vaccine is given in a clinic, pharmacy, doctor's office, or  other health care setting and will not be stored at home. NOTE: This sheet is a summary. It may not cover all possible information. If you have questions about this medicine, talk to your doctor, pharmacist, or health care provider.  2018 Elsevier/Gold Standard (2014-02-05 10:27:27)

## 2017-06-19 NOTE — Progress Notes (Signed)
BP 119/82 (BP Location: Left Arm, Patient Position: Sitting, Cuff Size: Normal)   Pulse 76   Temp (!) 97.5 F (36.4 C)   Ht 5\' 6"  (1.676 m)   Wt 165 lb 6 oz (75 kg)   SpO2 96%   BMI 26.69 kg/m    Subjective:    Patient ID: Makayla [name], female    DOB: November 13, 1950, 67 y.o.   MRN: [id number]  HPI: Makayla [name] is a 67 y.o. female presenting on 06/19/2017 for comprehensive medical examination and welcome to medicare visit. Current medical complaints include:none  She currently lives with: husband Menopausal Symptoms: yes- vaginal driness, not particularly bothersome  Functional Status Survey: Is the patient deaf or have difficulty hearing?: No Does the patient have difficulty seeing, even when wearing glasses/contacts?: No Does the patient have difficulty concentrating, remembering, or making decisions?: No Does the patient have difficulty walking or climbing stairs?: No Does the patient have difficulty dressing or bathing?: No Does the patient have difficulty doing errands alone such as visiting a doctor's office or shopping?: No  Fall Risk  06/19/2017 03/23/2017  Falls in the past year? No No    Depression Screen Depression screen [facility] 2/9 06/19/2017 03/23/2017  Decreased Interest 0 0  Down, Depressed, Hopeless 0 0  PHQ - 2 Score 0 0  Altered sleeping 0 -  Tired, decreased energy 0 -  Change in appetite 0 -  Feeling bad or failure about yourself  0 -  Trouble concentrating 0 -  Moving slowly or fidgety/restless 0 -  Suicidal thoughts 0 -  PHQ-9 Score 0 -   Advanced Directives Does patient have a HCPOA?    no Does patient have a living will or MOST form?  no  Past Medical History:  Past Medical History:  Diagnosis Date  . DDD (degenerative disc disease), lumbar   . Hypertension   . Sciatica     Surgical History:  Past Surgical History:  Procedure Laterality Date  . OVARIAN CYST REMOVAL Left   . TUBAL LIGATION     X 2    Medications:  Current Outpatient  Medications on File Prior to Visit  Medication Sig  . ibuprofen (ADVIL,MOTRIN) 200 MG tablet Take 200 mg by mouth every 6 (six) hours as needed.   No current facility-administered medications on file prior to visit.     Allergies:  No Known Allergies  Social History:  Social History   Socioeconomic History  . Marital status: Married    Spouse name: Not on file  . Number of children: Not on file  . Years of education: Not on file  . Highest education level: Not on file  Social Needs  . Financial resource strain: Not on file  . Food insecurity - worry: Not on file  . Food insecurity - inability: Not on file  . Transportation needs - medical: Not on file  . Transportation needs - non-medical: Not on file  Occupational History  . Not on file  Tobacco Use  . Smoking status: Former Smoker    Last attempt to quit: 03/23/2002    Years since quitting: 15.2  . Smokeless tobacco: Never Used  Substance and Sexual Activity  . Alcohol use: No    Frequency: Never  . Drug use: No  . Sexual activity: Not Currently  Other Topics Concern  . Not on file  Social History Narrative  . Not on file   Social History   Tobacco Use  Smoking Status  Former Smoker  . Last attempt to quit: 03/23/2002  . Years since quitting: 15.2  Smokeless Tobacco Never Used   Social History   Substance and Sexual Activity  Alcohol Use No  . Frequency: Never    Family History:  Family History  Problem Relation Age of Onset  . Breast cancer Sister 73  . Stroke Mother   . Cancer Brother        Melanoma- went to liver  . Cancer Brother        Liver    Past medical history, surgical history, medications, allergies, family history and social history reviewed with patient today and changes made to appropriate areas of the chart.   Review of Systems  Constitutional: Negative.   HENT: Negative.   Eyes: Negative.   Respiratory: Negative.   Cardiovascular: Negative.   Gastrointestinal: Negative.     Genitourinary: Positive for hematuria (has had it for years, has had cystoscope and was negative). Negative for dysuria, flank pain, frequency and urgency.  Musculoskeletal: Negative.   Skin: Negative.   Neurological: Negative.   Endo/Heme/Allergies: Negative for environmental allergies and polydipsia. Does not bruise/bleed easily.  Psychiatric/Behavioral: Negative.     All other ROS negative except what is listed above and in the HPI.      Objective:    BP 119/82 (BP Location: Left Arm, Patient Position: Sitting, Cuff Size: Normal)   Pulse 76   Temp (!) 97.5 F (36.4 C)   Ht 5\' 6"  (1.676 m)   Wt 165 lb 6 oz (75 kg)   SpO2 96%   BMI 26.69 kg/m   Wt Readings from Last 3 Encounters:  06/19/17 165 lb 6 oz (75 kg)  03/23/17 162 lb 4 oz (73.6 kg)     Hearing Screening   125Hz  250Hz  500Hz  1000Hz  2000Hz  3000Hz  4000Hz  6000Hz  8000Hz   Right ear:   40 Fail 40  40    Left ear:   25 25 25  25       Visual Acuity Screening   Right eye Left eye Both eyes  Without correction:     With correction: 20/25 20/20 20/20    Physical Exam  Constitutional: She is oriented to person, place, and time. She appears well-developed and well-nourished. No distress.  HENT:  Head: Normocephalic and atraumatic.  Right Ear: Hearing, tympanic membrane, external ear and ear canal normal.  Left Ear: Hearing, tympanic membrane, external ear and ear canal normal.  Nose: Nose normal.  Mouth/Throat: Uvula is midline, oropharynx is clear and moist and mucous membranes are normal. No oropharyngeal exudate.  Eyes: Conjunctivae, EOM and lids are normal. Pupils are equal, round, and reactive to light. Right eye exhibits no discharge. Left eye exhibits no discharge. No scleral icterus.  Neck: Normal range of motion. Neck supple. No JVD present. No tracheal deviation present. No thyromegaly present.  Cardiovascular: Normal rate, regular rhythm, normal heart sounds and intact distal pulses. Exam reveals no gallop and no  friction rub.  No murmur heard. Pulmonary/Chest: Effort normal and breath sounds normal. No stridor. No respiratory distress. She has no wheezes. She has no rales. She exhibits no tenderness. Right breast exhibits no inverted nipple, no mass, no nipple discharge, no skin change and no tenderness. Left breast exhibits no inverted nipple, no mass, no nipple discharge, no skin change and no tenderness. Breasts are symmetrical.  Abdominal: Soft. Bowel sounds are normal. She exhibits no distension and no mass. There is no tenderness. There is no rebound and no guarding. Hernia  confirmed negative in the right inguinal area and confirmed negative in the left inguinal area.  Genitourinary: Vagina normal and uterus normal. No labial fusion. There is no rash, tenderness, lesion or injury on the right labia. There is no rash, tenderness, lesion or injury on the left labia. Uterus is not deviated, not enlarged, not fixed and not tender. Cervix exhibits no motion tenderness, no discharge and no friability. Right adnexum displays no mass, no tenderness and no fullness. Left adnexum displays no mass, no tenderness and no fullness. No erythema, tenderness or bleeding in the vagina. No foreign body in the vagina. No signs of injury around the vagina. No vaginal discharge found.  Musculoskeletal: Normal range of motion. She exhibits no edema or tenderness.  Lymphadenopathy:    She has no cervical adenopathy.       Right: No inguinal adenopathy present.       Left: No inguinal adenopathy present.  Neurological: She is alert and oriented to person, place, and time. She has normal reflexes. She displays normal reflexes. No cranial nerve deficit. She exhibits normal muscle tone. Coordination normal.  Skin: Skin is warm, dry and intact. No rash noted. She is not diaphoretic. No erythema. No pallor.  Psychiatric: She has a normal mood and affect. Her speech is normal and behavior is normal. Judgment and thought content normal.  Cognition and memory are normal.  Nursing note and vitals reviewed.   6CIT Screen 06/19/2017  What Year? 0 points  What month? 0 points  What time? 0 points  Count back from 20 0 points  Months in reverse 0 points  Repeat phrase 0 points  Total Score 0    Results for orders placed or performed in visit on 03/23/17  HM PAP SMEAR  Result Value Ref Range   HM Pap smear normal   HM COLONOSCOPY  Result Value Ref Range   HM Colonoscopy See Report (in chart) See Report (in chart), Patient Reported      Assessment & Plan:   Problem List Items Addressed This Visit      Cardiovascular and Mediastinum   Hypertension    Under good control. Continue current regimen. Continue to monitor. Call with any concerns.       Relevant Medications   lisinopril (PRINIVIL,ZESTRIL) 10 MG tablet   Other Relevant Orders   Comprehensive metabolic panel   Microalbumin, Urine Waived     Other   History of tobacco abuse    Quit in 2003, 16 years ago. Not a candidate for lung cancer screening. Checking urine today. Call with any concerns.       Relevant Orders   CBC with Differential/Platelet   UA/M w/rflx Culture, Routine    Other Visit Diagnoses    Welcome to Medicare preventive visit    -  Primary   Preventative care discussed today as below.    Screening for cardiovascular condition       EKG normal today. Checking cholesterol. Call with any concerns.    Relevant Orders   EKG 12-Lead (Completed)   Screening for osteoporosis       DEXA ordered today.   Relevant Orders   DG Bone Density   Screening for diabetes mellitus       Labs drawn today. Await results.    Relevant Orders   Bayer DCA Hb A1c Waived   Comprehensive metabolic panel   Fatigue, unspecified type       Labs drawn today. Await results.    Relevant Orders  CBC with Differential/Platelet   TSH   Screening for cholesterol level       Labs drawn today. Await results.    Relevant Orders   Lipid Panel w/o Chol/HDL Ratio     Screening for cervical cancer       Pap done today.   Relevant Orders   IGP, Aptima HPV, rfx 16/18,45   Need for hepatitis C screening test       Labs drawn today. Await results.    Relevant Orders   Hepatitis C Antibody   Immunization due       Prenar given today.   Relevant Orders   Pneumococcal conjugate vaccine 13-valent       Preventative Services:  AAA screening: N/A Health Risk Assessment and Personalized Prevention Plan: Done today Bone Mass Measurements: Ordered today Breast Cancer Screening: Up to date- due next year CVD Screening: Done today Cervical Cancer Screening: Done today Colon Cancer Screening: Up to date, due in 2025 Depression Screening: Done today Diabetes Screening: Done today Glaucoma Screening: See your eye doctor Hepatitis B vaccine: N/A Hepatitis C screening: Checked today HIV Screening: Declined Flu Vaccine: up to date Lung cancer Screening: N/A Obesity Screening: Done today Pneumonia Vaccines (2): Prevnar given today, due for 2nd shot next year STI Screening: N/A  Follow up plan: Return in about 6 months (around 12/17/2017) for Follow up HTN.   LABORATORY TESTING:  - Pap smear: pap done  IMMUNIZATIONS:   - Tdap: Tetanus vaccination status reviewed: declined. - Influenza: Up to date - Pneumovax: Up to date - Prevnar: Administered today - Zostavax vaccine: Up to date  SCREENING: -Mammogram: Up to date  - Colonoscopy: Up to date  - Bone Density: Ordered today  -Hearing Test: Ordered today  -Spirometry: Not applicable   PATIENT COUNSELING:   Advised to take 1 mg of folate supplement per day if capable of pregnancy.   Sexuality: Discussed sexually transmitted diseases, partner selection, use of condoms, avoidance of unintended pregnancy  and contraceptive alternatives.   Advised to avoid cigarette smoking.  I discussed with the patient that most people either abstain from alcohol or drink within safe limits (<=14/week and <=4  drinks/occasion for males, <=7/weeks and <= 3 drinks/occasion for females) and that the risk for alcohol disorders and other health effects rises proportionally with the number of drinks per week and how often a drinker exceeds daily limits.  Discussed cessation/primary prevention of drug use and availability of treatment for abuse.   Diet: Encouraged to adjust caloric intake to maintain  or achieve ideal body weight, to reduce intake of dietary saturated fat and total fat, to limit sodium intake by avoiding high sodium foods and not adding table salt, and to maintain adequate dietary potassium and calcium preferably from fresh fruits, vegetables, and low-fat dairy products.    stressed the importance of regular exercise  Injury prevention: Discussed safety belts, safety helmets, smoke detector, smoking near bedding or upholstery.   Dental health: Discussed importance of regular tooth brushing, flossing, and dental visits.    NEXT PREVENTATIVE PHYSICAL DUE IN 1 YEAR. Return in about 6 months (around 12/17/2017) for Follow up HTN.

## 2017-06-19 NOTE — Assessment & Plan Note (Signed)
Under good control. Continue current regimen. Continue to monitor. Call with any concerns. 

## 2017-06-20 ENCOUNTER — Encounter: Payer: Self-pay | Admitting: Family Medicine

## 2017-06-20 DIAGNOSIS — E785 Hyperlipidemia, unspecified: Secondary | ICD-10-CM | POA: Insufficient documentation

## 2017-06-20 LAB — COMPREHENSIVE METABOLIC PANEL
A/G RATIO: 2.2 (ref 1.2–2.2)
ALT: 31 IU/L (ref 0–32)
AST: 27 IU/L (ref 0–40)
Albumin: 4.6 g/dL (ref 3.6–4.8)
Alkaline Phosphatase: 80 IU/L (ref 39–117)
BUN/Creatinine Ratio: 32 — ABNORMAL HIGH (ref 12–28)
BUN: 28 mg/dL — AB (ref 8–27)
Bilirubin Total: <0.2 mg/dL (ref 0.0–1.2)
CALCIUM: 9.9 mg/dL (ref 8.7–10.3)
CO2: 27 mmol/L (ref 20–29)
Chloride: 102 mmol/L (ref 96–106)
Creatinine, Ser: 0.88 mg/dL (ref 0.57–1.00)
GFR calc Af Amer: 80 mL/min/{1.73_m2} (ref >59)
GFR, EST NON AFRICAN AMERICAN: 69 mL/min/{1.73_m2} (ref >59)
GLUCOSE: 81 mg/dL (ref 65–99)
Globulin, Total: 2.1 g/dL (ref 1.5–4.5)
POTASSIUM: 4.2 mmol/L (ref 3.5–5.2)
Sodium: 144 mmol/L (ref 134–144)
TOTAL PROTEIN: 6.7 g/dL (ref 6.0–8.5)

## 2017-06-20 LAB — LIPID PANEL W/O CHOL/HDL RATIO
CHOLESTEROL TOTAL: 264 mg/dL — AB (ref 100–199)
HDL: 73 mg/dL (ref >39)
LDL Calculated: 152 mg/dL — ABNORMAL HIGH (ref 0–99)
TRIGLYCERIDES: 196 mg/dL — AB (ref 0–149)
VLDL Cholesterol Cal: 39 mg/dL (ref 5–40)

## 2017-06-20 LAB — CBC WITH DIFFERENTIAL/PLATELET
BASOS ABS: 0.1 10*3/uL (ref 0.0–0.2)
Basos: 1 %
EOS (ABSOLUTE): 0.1 10*3/uL (ref 0.0–0.4)
Eos: 1 %
Hematocrit: 38.3 % (ref 34.0–46.6)
Hemoglobin: 12.4 g/dL (ref 11.1–15.9)
IMMATURE GRANS (ABS): 0 10*3/uL (ref 0.0–0.1)
Immature Granulocytes: 0 %
LYMPHS: 32 %
Lymphocytes Absolute: 3.1 10*3/uL (ref 0.7–3.1)
MCH: 31.2 pg (ref 26.6–33.0)
MCHC: 32.4 g/dL (ref 31.5–35.7)
MCV: 97 fL (ref 79–97)
MONOS ABS: 0.7 10*3/uL (ref 0.1–0.9)
Monocytes: 7 %
NEUTROS PCT: 59 %
Neutrophils Absolute: 5.7 10*3/uL (ref 1.4–7.0)
PLATELETS: 241 10*3/uL (ref 150–379)
RBC: 3.97 x10E6/uL (ref 3.77–5.28)
RDW: 13.4 % (ref 12.3–15.4)
WBC: 9.6 10*3/uL (ref 3.4–10.8)

## 2017-06-20 LAB — HEPATITIS C ANTIBODY: Hep C Virus Ab: <0.1 s/co ratio (ref 0.0–0.9)

## 2017-06-20 LAB — TSH: TSH: 2.57 u[IU]/mL (ref 0.450–4.500)

## 2017-06-21 ENCOUNTER — Encounter: Payer: Self-pay | Admitting: Family Medicine

## 2017-06-21 LAB — IGP, APTIMA HPV, RFX 16/18,45
HPV APTIMA: NEGATIVE
PAP Smear Comment: 0

## 2017-06-21 MED ORDER — ROSUVASTATIN CALCIUM 10 MG PO TABS: 10.0000 mg | ORAL_TABLET | Freq: Every day | ORAL | 3 refills | Status: DC

## 2017-07-23 ENCOUNTER — Encounter: Payer: Self-pay | Admitting: Family Medicine

## 2017-10-03 DIAGNOSIS — M25561 Pain in right knee: Secondary | ICD-10-CM | POA: Diagnosis not present

## 2017-10-03 DIAGNOSIS — M545 Low back pain: Secondary | ICD-10-CM | POA: Diagnosis not present

## 2017-10-03 DIAGNOSIS — M222X1 Patellofemoral disorders, right knee: Secondary | ICD-10-CM | POA: Diagnosis not present

## 2017-10-03 DIAGNOSIS — M7061 Trochanteric bursitis, right hip: Secondary | ICD-10-CM | POA: Diagnosis not present

## 2017-10-15 DIAGNOSIS — M7061 Trochanteric bursitis, right hip: Secondary | ICD-10-CM | POA: Diagnosis not present

## 2017-10-15 DIAGNOSIS — M545 Low back pain: Secondary | ICD-10-CM | POA: Diagnosis not present

## 2017-10-22 DIAGNOSIS — M545 Low back pain: Secondary | ICD-10-CM | POA: Diagnosis not present

## 2017-10-22 DIAGNOSIS — M7061 Trochanteric bursitis, right hip: Secondary | ICD-10-CM | POA: Diagnosis not present

## 2017-11-06 DIAGNOSIS — M545 Low back pain: Secondary | ICD-10-CM | POA: Diagnosis not present

## 2017-11-06 DIAGNOSIS — M7061 Trochanteric bursitis, right hip: Secondary | ICD-10-CM | POA: Diagnosis not present

## 2017-11-20 DIAGNOSIS — M545 Low back pain: Secondary | ICD-10-CM | POA: Diagnosis not present

## 2018-03-13 ENCOUNTER — Encounter: Payer: Self-pay | Admitting: Family Medicine

## 2018-04-15 ENCOUNTER — Other Ambulatory Visit: Payer: Self-pay | Admitting: Family Medicine

## 2018-04-15 DIAGNOSIS — Z1231 Encounter for screening mammogram for malignant neoplasm of breast: Secondary | ICD-10-CM

## 2018-04-29 ENCOUNTER — Ambulatory Visit
Admission: RE | Admit: 2018-04-29 | Discharge: 2018-04-29 | Disposition: A | Discharge status: Home / Self Care | Payer: PPO | Source: Ambulatory Visit | Attending: Family Medicine | Admitting: Family Medicine

## 2018-04-29 DIAGNOSIS — Z1231 Encounter for screening mammogram for malignant neoplasm of breast: Secondary | ICD-10-CM | POA: Diagnosis not present

## 2018-05-13 ENCOUNTER — Ambulatory Visit: Payer: Self-pay

## 2018-05-13 NOTE — Telephone Encounter (Signed)
Patient called in with c/o "dizziness." She says "this started around Christmas. I feel my head spinning, woozy head and have to hold on to walk at times. It comes and goes. I drove today to [facility] and as I got out to start walking into the house, everything went black and I had to hold on to walk. Then it passed and everything was alright." I asked about the severity, she says "mild-moderate." I asked about other symptoms, she says "a little headache and nausea." According to protocol, see PCP within 3 days, appointment already scheduled for Thursday, 05/16/18 at 0830 with Dr. Wynetta Emery, care advice given, patient verbalized understanding.  Reason for Disposition . [1] MODERATE dizziness (e.g., vertigo; feels very unsteady, interferes with normal activities) AND [2] has been evaluated by physician for this  Answer Assessment - Initial Assessment Questions 1. DESCRIPTION: "Describe your dizziness."     Spinning, woozy head, off balance 2. VERTIGO: "Do you feel like either you or the room is spinning or tilting?"      Yes 3. LIGHTHEADED: "Do you feel lightheaded?" (e.g., somewhat faint, woozy, weak upon standing)     Yes 4. SEVERITY: "How bad is it?"  "Can you walk?"   - MILD - Feels unsteady but walking normally.   - MODERATE - Feels very unsteady when walking, but not falling; interferes with normal activities (e.g., school, work) .   - SEVERE - Unable to walk without falling (requires assistance).     Mild-Moderate 5. ONSET:  "When did the dizziness begin?"     05/08/18 off and on; a little dizzy at present time 6. AGGRAVATING FACTORS: "Does anything make it worse?" (e.g., standing, change in head position)     Changing position of head, standing 7. CAUSE: "What do you think is causing the dizziness?"     I don't know 8. RECURRENT SYMPTOM: "Have you had dizziness before?" If so, ask: "When was the last time?" "What happened that time?"     No 9. OTHER SYMPTOMS: "Do you have any other  symptoms?" (e.g., headache, weakness, numbness, vomiting, earache)     A little headache, nausea 10. PREGNANCY: "Is there any chance you are pregnant?" "When was your last menstrual period?"       No  Protocols used: DIZZINESS - VERTIGO-A-AH

## 2018-05-16 ENCOUNTER — Ambulatory Visit: Payer: BC Managed Care – PPO | Admitting: Family Medicine

## 2018-07-03 ENCOUNTER — Ambulatory Visit (INDEPENDENT_AMBULATORY_CARE_PROVIDER_SITE_OTHER): Payer: PPO

## 2018-07-03 VITALS — BP 132/82 | HR 68 | Temp 97.6°F | Resp 16 | Ht 66.0 in | Wt 166.4 lb

## 2018-07-03 DIAGNOSIS — Z0000 Encounter for general adult medical examination without abnormal findings: Secondary | ICD-10-CM

## 2018-07-03 DIAGNOSIS — Z23 Encounter for immunization: Secondary | ICD-10-CM | POA: Diagnosis not present

## 2018-07-03 NOTE — Patient Instructions (Signed)
Makayla [name] , Thank you for taking time to come for your Medicare Wellness Visit. I appreciate your ongoing commitment to your health goals. Please review the following plan we discussed and let me know if I can assist you in the future.   Screening recommendations/referrals: Colonoscopy: completed 03/03/2014 Mammogram: completed 04/29/2018 Bone Density: delined Recommended yearly ophthalmology/optometry visit for glaucoma screening and checkup Recommended yearly dental visit for hygiene and checkup  Vaccinations: Influenza vaccine: up to date Pneumococcal vaccine: completed series Tdap vaccine: due, check with your insurance company for coverage  Shingles vaccine: due, check with your insurance company for coverage   Advanced directives: Advance directive discussed with you today. Even though you declined this today please call our office should you change your mind and we can give you the proper paperwork for you to fill out.  Conditions/risks identified: increase exercise and decrease sodium intake.   Next appointment: Follow up with Dr.Johnson for your physical. Follow up in one year for your annual wellness exam.    Preventive Care 65 Years and Older, Female Preventive care refers to lifestyle choices and visits with your health care provider that can promote health and wellness. What does preventive care include?  A yearly physical exam. This is also called an annual well check.  Dental exams once or twice a year.  Routine eye exams. Ask your health care provider how often you should have your eyes checked.  Personal lifestyle choices, including:  Daily care of your teeth and gums.  Regular physical activity.  Eating a healthy diet.  Avoiding tobacco and drug use.  Limiting alcohol use.  Practicing safe sex.  Taking low-dose aspirin every day.  Taking vitamin and mineral supplements as recommended by your health care provider. What happens during an annual well  check? The services and screenings done by your health care provider during your annual well check will depend on your age, overall health, lifestyle risk factors, and family history of disease. Counseling  Your health care provider may ask you questions about your:  Alcohol use.  Tobacco use.  Drug use.  Emotional well-being.  Home and relationship well-being.  Sexual activity.  Eating habits.  History of falls.  Memory and ability to understand (cognition).  Work and work Statistician.  Reproductive health. Screening  You may have the following tests or measurements:  Height, weight, and BMI.  Blood pressure.  Lipid and cholesterol levels. These may be checked every 5 years, or more frequently if you are over 68 years old.  Skin check.  Lung cancer screening. You may have this screening every year starting at age 41 if you have a 30-pack-year history of smoking and currently smoke or have quit within the past 15 years.  Fecal occult blood test (FOBT) of the stool. You may have this test every year starting at age 33.  Flexible sigmoidoscopy or colonoscopy. You may have a sigmoidoscopy every 5 years or a colonoscopy every 10 years starting at age 17.  Hepatitis C blood test.  Hepatitis B blood test.  Sexually transmitted disease (STD) testing.  Diabetes screening. This is done by checking your blood sugar (glucose) after you have not eaten for a while (fasting). You may have this done every 1-3 years.  Bone density scan. This is done to screen for osteoporosis. You may have this done starting at age 62.  Mammogram. This may be done every 1-2 years. Talk to your health care provider about how often you should have regular mammograms.  Talk with your health care provider about your test results, treatment options, and if necessary, the need for more tests. Vaccines  Your health care provider may recommend certain vaccines, such as:  Influenza vaccine. This is  recommended every year.  Tetanus, diphtheria, and acellular pertussis (Tdap, Td) vaccine. You may need a Td booster every 10 years.  Zoster vaccine. You may need this after age 44.  Pneumococcal 13-valent conjugate (PCV13) vaccine. One dose is recommended after age 60.  Pneumococcal polysaccharide (PPSV23) vaccine. One dose is recommended after age 4. Talk to your health care provider about which screenings and vaccines you need and how often you need them. This information is not intended to replace advice given to you by your health care provider. Make sure you discuss any questions you have with your health care provider. Document Released: 05/28/2015 Document Revised: 01/19/2016 Document Reviewed: 03/02/2015 Elsevier Interactive Patient Education  2017 [employer] Prevention in the Home Falls can cause injuries. They can happen to people of all ages. There are many things you can do to make your home safe and to help prevent falls. What can I do on the outside of my home?  Regularly fix the edges of walkways and driveways and fix any cracks.  Remove anything that might make you trip as you walk through a door, such as a raised step or threshold.  Trim any bushes or trees on the path to your home.  Use bright outdoor lighting.  Clear any walking paths of anything that might make someone trip, such as rocks or tools.  Regularly check to see if handrails are loose or broken. Make sure that both sides of any steps have handrails.  Any raised decks and porches should have guardrails on the edges.  Have any leaves, snow, or ice cleared regularly.  Use sand or salt on walking paths during winter.  Clean up any spills in your garage right away. This includes oil or grease spills. What can I do in the bathroom?  Use night lights.  Install grab bars by the toilet and in the tub and shower. Do not use towel bars as grab bars.  Use non-skid mats or decals in the tub or  shower.  If you need to sit down in the shower, use a plastic, non-slip stool.  Keep the floor dry. Clean up any water that spills on the floor as soon as it happens.  Remove soap buildup in the tub or shower regularly.  Attach bath mats securely with double-sided non-slip rug tape.  Do not have throw rugs and other things on the floor that can make you trip. What can I do in the bedroom?  Use night lights.  Make sure that you have a light by your bed that is easy to reach.  Do not use any sheets or blankets that are too big for your bed. They should not hang down onto the floor.  Have a firm chair that has side arms. You can use this for support while you get dressed.  Do not have throw rugs and other things on the floor that can make you trip. What can I do in the kitchen?  Clean up any spills right away.  Avoid walking on wet floors.  Keep items that you use a lot in easy-to-reach places.  If you need to reach something above you, use a strong step stool that has a grab bar.  Keep electrical cords out of the way.  Do not use floor polish or wax that makes floors slippery. If you must use wax, use non-skid floor wax.  Do not have throw rugs and other things on the floor that can make you trip. What can I do with my stairs?  Do not leave any items on the stairs.  Make sure that there are handrails on both sides of the stairs and use them. Fix handrails that are broken or loose. Make sure that handrails are as long as the stairways.  Check any carpeting to make sure that it is firmly attached to the stairs. Fix any carpet that is loose or worn.  Avoid having throw rugs at the top or bottom of the stairs. If you do have throw rugs, attach them to the floor with carpet tape.  Make sure that you have a light switch at the top of the stairs and the bottom of the stairs. If you do not have them, ask someone to add them for you. What else can I do to help prevent  falls?  Wear shoes that:  Do not have high heels.  Have rubber bottoms.  Are comfortable and fit you well.  Are closed at the toe. Do not wear sandals.  If you use a stepladder:  Make sure that it is fully opened. Do not climb a closed stepladder.  Make sure that both sides of the stepladder are locked into place.  Ask someone to hold it for you, if possible.  Clearly mark and make sure that you can see:  Any grab bars or handrails.  First and last steps.  Where the edge of each step is.  Use tools that help you move around (mobility aids) if they are needed. These include:  Canes.  Walkers.  Scooters.  Crutches.  Turn on the lights when you go into a dark area. Replace any light bulbs as soon as they burn out.  Set up your furniture so you have a clear path. Avoid moving your furniture around.  If any of your floors are uneven, fix them.  If there are any pets around you, be aware of where they are.  Review your medicines with your doctor. Some medicines can make you feel dizzy. This can increase your chance of falling. Ask your doctor what other things that you can do to help prevent falls. This information is not intended to replace advice given to you by your health care provider. Make sure you discuss any questions you have with your health care provider. Document Released: 02/25/2009 Document Revised: 10/07/2015 Document Reviewed: 06/05/2014 Elsevier Interactive Patient Education  2017 [employer].

## 2018-07-03 NOTE — Progress Notes (Signed)
Subjective:   Makayla [name] is a 68 y.o. female who presents for Medicare Annual (Subsequent) preventive examination.  Review of Systems:   Cardiac Risk Factors include: advanced age (>48men, >60 women);hypertension;dyslipidemia     Objective:     Vitals: BP 132/82 (BP Location: Left Arm, Patient Position: Sitting, Cuff Size: Normal)   Pulse 68   Temp 97.6 F (36.4 C) (Temporal)   Resp 16   Ht 5\' 6"  (1.676 m)   Wt 166 lb 6.4 oz (75.5 kg)   BMI 26.86 kg/m   Body mass index is 26.86 kg/m.  Advanced Directives 07/03/2018  Does Patient Have a Medical Advance Directive? No  Would patient like information on creating a medical advance directive? No - Patient declined    Tobacco Social History   Tobacco Use  Smoking Status Former Smoker  . Last attempt to quit: 03/23/2002  . Years since quitting: 16.2  Smokeless Tobacco Never Used     Counseling given: Not Answered   Clinical Intake:  Pre-visit preparation completed: Yes  Pain : 0-10 Pain Score: 4  Pain Type: Chronic pain Pain Location: Hip Pain Orientation: Right Pain Descriptors / Indicators: Aching Pain Onset: More than a month ago Pain Frequency: Constant Pain Relieving Factors: injections at [facility] - last a few months  Pain Relieving Factors: injections at [facility] - last a few months  Nutritional Status: BMI 25 -29 Overweight Nutritional Risks: None Diabetes: No  How often do you need to have someone help you when you read instructions, pamphlets, or other written materials from your doctor or pharmacy?: 1 - Never What is the last grade level you completed in school?: 12th grade  Interpreter Needed?: No  Information entered by ::  ,LPN   Past Medical History:  Diagnosis Date  . DDD (degenerative disc disease), lumbar   . Hypertension   . Sciatica    Past Surgical History:  Procedure Laterality Date  . OVARIAN CYST REMOVAL Left   . TUBAL LIGATION     X 2   Family  History  Problem Relation Age of Onset  . Breast cancer Sister 69  . Stroke Mother   . Cancer Brother        Melanoma- went to liver  . Melanoma Brother   . Liver cancer Brother   . Liver cancer Brother    Social History   Socioeconomic History  . Marital status: Married    Spouse name: Not on file  . Number of children: Not on file  . Years of education: Not on file  . Highest education level: High school graduate  Occupational History  . Occupation: retired  Scientific laboratory technician  . Financial resource strain: Not hard at all  . Food insecurity:    Worry: Never true    Inability: Never true  . Transportation needs:    Medical: No    Non-medical: No  Tobacco Use  . Smoking status: Former Smoker    Last attempt to quit: 03/23/2002    Years since quitting: 16.2  . Smokeless tobacco: Never Used  Substance and Sexual Activity  . Alcohol use: No    Frequency: Never  . Drug use: No  . Sexual activity: Not Currently  Lifestyle  . Physical activity:    Days per week: 0 days    Minutes per session: 0 min  . Stress: Not at all  Relationships  . Social connections:    Talks on phone: More than three times a  week    Gets together: More than three times a week    Attends religious service: More than 4 times per year    Active member of club or organization: No    Attends meetings of clubs or organizations: Never    Relationship status: Married  Other Topics Concern  . Not on file  Social History Narrative   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     Outpatient Encounter Medications as of 07/03/2018  Medication Sig  . ibuprofen (ADVIL,MOTRIN) 200 MG tablet Take 200 mg by mouth every 6 (six) hours as needed.  Marland Kitchen lisinopril (PRINIVIL,ZESTRIL) 10 MG tablet Take 1 tablet (10 mg total) by mouth daily.  Marland Kitchen omeprazole (PRILOSEC) 20 MG capsule Take 20 mg by mouth daily.  . rosuvastatin (CRESTOR) 10 MG tablet Take 1 tablet (10 mg total) by mouth daily. (Patient not taking: Reported on 07/03/2018)   No  facility-administered encounter medications on file as of 07/03/2018.     Activities of Daily Living In your present state of health, do you have any difficulty performing the following activities: 07/03/2018  Hearing? N  Vision? N  Difficulty concentrating or making decisions? N  Walking or climbing stairs? Y  Dressing or bathing? N  Doing errands, shopping? N  Preparing Food and eating ? N  Using the Toilet? N  In the past six months, have you accidently leaked urine? N  Do you have problems with loss of bowel control? N  Managing your Medications? N  Managing your Finances? N  Housekeeping or managing your Housekeeping? N  Some recent data might be hidden    Patient Care Team: Valerie Roys, DO as PCP - General (Family Medicine)    Assessment:   This is a routine wellness examination for Makayla [name].  Exercise Activities and Dietary recommendations Current Exercise Habits: The patient does not participate in regular exercise at present, Exercise limited by: None identified  Goals    . DIET - REDUCE SODIUM INTAKE (pt-stated)    . Exercise 3x per week (30 min per time)       Fall Risk Fall Risk  07/03/2018 06/19/2017 03/23/2017  Falls in the past year? 0 No No  Number falls in past yr: 0 - -  Injury with Fall? 0 - -   FALL RISK PREVENTION PERTAINING TO THE HOME:  Any stairs in or around the home? No  If so, are there any without handrails? n/a  Home free of loose throw rugs in walkways, pet beds, electrical cords, etc? Yes  Adequate lighting in your home to reduce risk of falls? Yes   ASSISTIVE DEVICES UTILIZED TO PREVENT FALLS:  Life alert? No  Use of a cane, walker or w/c? No  Grab bars in the bathroom? yes Shower chair or bench in shower? No  Elevated toilet seat or a handicapped toilet? No   DME ORDERS:  DME order needed?  No   TIMED UP AND GO:  Was the test performed? Yes .  Length of time to ambulate 10 feet: 11 sec.   GAIT:  Appearance of gait:  Gait stead-fast without the use of an assistive device.  Education: Fall risk prevention has been discussed.  Intervention(s) required? No    Depression Screen PHQ 2/9 Scores 07/03/2018 06/19/2017 03/23/2017  PHQ - 2 Score 0 0 0  PHQ- 9 Score - 0 -     Cognitive Function     6CIT Screen 07/03/2018 06/19/2017  What Year? 0 points 0 points  What month? 0 points 0 points  What time? 0 points 0 points  Count back from 20 0 points 0 points  Months in reverse 0 points 0 points  Repeat phrase 0 points 0 points  Total Score 0 0    Immunization History  Administered Date(s) Administered  . Influenza, High Dose Seasonal PF 03/15/2018  . Influenza-Unspecified 03/05/2017  . Pneumococcal Conjugate-13 06/19/2017  . Pneumococcal Polysaccharide-23 05/15/2012, 07/03/2018  . Tetanus 10/29/2005  . Zoster 01/25/2015    Qualifies for Shingles Vaccine?Yes  Zostavax completed 01/25/2015. Due for Shingrix. Education has been provided regarding the importance of this vaccine. Pt has been advised to call insurance company to determine out of pocket expense. Advised may also receive vaccine at local pharmacy or Health Dept. Verbalized acceptance and understanding.  Tdap: Although this vaccine is not a covered service during a Wellness Exam, does the patient still wish to receive this vaccine today?  No .  Education has been provided regarding the importance of this vaccine. Advised may receive this vaccine at local pharmacy or Health Dept. Aware to provide a copy of the vaccination record if obtained from local pharmacy or Health Dept. Verbalized acceptance and understanding.  Flu Vaccine: up to date   Pneumococcal Vaccine: Due for Pneumococcal vaccine. Does the patient want to receive this vaccine today?  Yes .   Screening Tests Health Maintenance  Topic Date Due  . INFLUENZA VACCINE  12/13/2017  . PNA vac Low Risk Adult (2 of 2 - PPSV23) 06/19/2018  . DEXA SCAN  07/04/2019 (Originally 05/26/2017)  .  TETANUS/TDAP  07/04/2019 (Originally 10/30/2015)  . MAMMOGRAM  04/29/2020  . COLONOSCOPY  03/03/2024  . Hepatitis C Screening  Completed    Cancer Screenings:  Colorectal Screening: Completed 03/03/2014. Repeat every 10 years  Mammogram: Completed 04/19/2018 Repeat every year  Bone Density:  declined  Lung Cancer Screening: (Low Dose CT Chest recommended if Age 36-80 years, 30 pack-year currently smoking OR have quit w/in 15years.) does not qualify.     Additional Screening:  Hepatitis C Screening: does qualify; Completed 06/19/2017  Vision Screening: Recommended annual ophthalmology exams for early detection of glaucoma and other disorders of the eye. Is the patient up to date with their annual eye exam?  Yes  Who is the provider or what is the name of the office in which the pt attends annual eye exams? [facility]    Dental Screening: Recommended annual dental exams for proper oral hygiene  Community Resource Referral:  CRR required this visit?  No      Plan:    I have personally reviewed and addressed the Medicare Annual Wellness questionnaire and have noted the following in the patient's chart:  A. Medical and social history B. Use of alcohol, tobacco or illicit drugs  C. Current medications and supplements D. Functional ability and status E.  Nutritional status F.  Physical activity G. Advance directives H. List of other physicians I.  Hospitalizations, surgeries, and ER visits in previous 12 months J.  [place] such as hearing and vision if needed, cognitive and depression L. Referrals and appointments   In addition, I have reviewed and discussed with patient certain preventive protocols, quality metrics, and best practice recommendations. A written personalized care plan for preventive services as well as general preventive health recommendations were provided to patient.   Signed,  Tyler Aas, LPN Nurse Health Advisor   Nurse Notes: Patient  states she stopped rosuvastatin in march of 2019, states  the headaches got better when she stopped taking it but forgot to ask if she should start taking something else or a lower dose. Advised to schedule an appt with Dr.Johnson for annual physical.

## 2018-07-12 ENCOUNTER — Ambulatory Visit (INDEPENDENT_AMBULATORY_CARE_PROVIDER_SITE_OTHER): Payer: PPO | Admitting: Family Medicine

## 2018-07-12 ENCOUNTER — Other Ambulatory Visit: Payer: Self-pay

## 2018-07-12 ENCOUNTER — Encounter: Payer: Self-pay | Admitting: Family Medicine

## 2018-07-12 VITALS — BP 118/79 | HR 62 | Temp 97.6°F | Ht 66.0 in | Wt 165.0 lb

## 2018-07-12 DIAGNOSIS — M7061 Trochanteric bursitis, right hip: Secondary | ICD-10-CM | POA: Diagnosis not present

## 2018-07-12 DIAGNOSIS — E782 Mixed hyperlipidemia: Secondary | ICD-10-CM | POA: Diagnosis not present

## 2018-07-12 MED ORDER — ATORVASTATIN CALCIUM 40 MG PO TABS: 40.0000 mg | ORAL_TABLET | Freq: Every day | ORAL | 3 refills | Status: DC

## 2018-07-12 NOTE — Assessment & Plan Note (Signed)
Did not tolerate crestor. Will change to atorvastatin. If tolerates will recheck 1 month. If not, will change to zetia.

## 2018-07-12 NOTE — Progress Notes (Signed)
BP 118/79   Pulse 62   Temp 97.6 F (36.4 C) (Oral)   Ht 5\' 6"  (1.676 m)   Wt 165 lb (74.8 kg)   SpO2 99%   BMI 26.63 kg/m    Subjective:    Patient ID: Makayla [name], female    DOB: [birth year], 68 y.o.   MRN: [id number]  HPI: SALEM MASTROGIOVANNI is a 68 y.o. female  Chief Complaint  Patient presents with  . Hyperlipidemia    cholesterol medication concerns, crestor.   HYPERLIPIDEMIA- started giving her headaches on the crestor. Stopped it and the headache went away. She would like to change to something else.  Hyperlipidemia status: newly diagnosed Satisfied with current treatment?  no Side effects:  yes Medication compliance: good compliance Past cholesterol meds: crestor Supplements: none Aspirin:  no The 10-year ASCVD risk score Mikey Bussing DC Jr., et al., 2013) is: 7.3%   Values used to calculate the score:     Age: 5 years     Sex: Female     Is Non-Hispanic African American: No     Diabetic: No     Tobacco smoker: No     Systolic Blood Pressure: 488 mmHg     Is BP treated: Yes     HDL Cholesterol: 73 mg/dL     Total Cholesterol: 264 mg/dL Chest pain:  no Coronary artery disease:  no   Relevant past medical, surgical, family and social history reviewed and updated as indicated. Interim medical history since our last visit reviewed. Allergies and medications reviewed and updated.  Review of Systems  Constitutional: Negative.   Respiratory: Negative.   Cardiovascular: Negative.   Neurological: Negative.   Psychiatric/Behavioral: Negative.     Per HPI unless specifically indicated above     Objective:    BP 118/79   Pulse 62   Temp 97.6 F (36.4 C) (Oral)   Ht 5\' 6"  (1.676 m)   Wt 165 lb (74.8 kg)   SpO2 99%   BMI 26.63 kg/m   Wt Readings from Last 3 Encounters:  07/12/18 165 lb (74.8 kg)  07/03/18 166 lb 6.4 oz (75.5 kg)  06/19/17 165 lb 6 oz (75 kg)    Physical Exam Vitals signs and nursing note reviewed.  Constitutional:      General:  She is not in acute distress.    Appearance: Normal appearance. She is not ill-appearing, toxic-appearing or diaphoretic.  HENT:     Head: Normocephalic and atraumatic.     Right Ear: External ear normal.     Left Ear: External ear normal.     Nose: Nose normal.     Mouth/Throat:     Mouth: Mucous membranes are moist.     Pharynx: Oropharynx is clear.  Eyes:     General: No scleral icterus.       Right eye: No discharge.        Left eye: No discharge.     Extraocular Movements: Extraocular movements intact.     Conjunctiva/sclera: Conjunctivae normal.     Pupils: Pupils are equal, round, and reactive to light.  Neck:     Musculoskeletal: Normal range of motion and neck supple.  Cardiovascular:     Rate and Rhythm: Normal rate and regular rhythm.     Pulses: Normal pulses.     Heart sounds: Normal heart sounds. No murmur. No friction rub. No gallop.   Pulmonary:     Effort: Pulmonary effort is normal. No respiratory distress.  Breath sounds: Normal breath sounds. No stridor. No wheezing, rhonchi or rales.  Chest:     Chest wall: No tenderness.  Musculoskeletal: Normal range of motion.  Skin:    General: Skin is warm and dry.     Capillary Refill: Capillary refill takes less than 2 seconds.     Coloration: Skin is not jaundiced or pale.     Findings: No bruising, erythema, lesion or rash.  Neurological:     General: No focal deficit present.     Mental Status: She is alert and oriented to person, place, and time. Mental status is at baseline.  Psychiatric:        Mood and Affect: Mood normal.        Behavior: Behavior normal.        Thought Content: Thought content normal.        Judgment: Judgment normal.     Results for orders placed or performed in visit on 06/19/17  Microscopic Examination  Result Value Ref Range   WBC, UA 0-5 0 - 5 /hpf   RBC, UA 0-2 0 - 2 /hpf   Epithelial Cells (non renal) 0-10 0 - 10 /hpf   Bacteria, UA None seen None seen/Few  CBC with  Differential/Platelet  Result Value Ref Range   WBC 9.6 3.4 - 10.8 x10E3/uL   RBC 3.97 3.77 - 5.28 x10E6/uL   Hemoglobin 12.4 11.1 - 15.9 g/dL   Hematocrit 38.3 34.0 - 46.6 %   MCV 97 79 - 97 fL   MCH 31.2 26.6 - 33.0 pg   MCHC 32.4 31.5 - 35.7 g/dL   RDW 13.4 12.3 - 15.4 %   Platelets 241 150 - 379 x10E3/uL   Neutrophils 59 Not Estab. %   Lymphs 32 Not Estab. %   Monocytes 7 Not Estab. %   Eos 1 Not Estab. %   Basos 1 Not Estab. %   Neutrophils Absolute 5.7 1.4 - 7.0 x10E3/uL   Lymphocytes Absolute 3.1 0.7 - 3.1 x10E3/uL   Monocytes Absolute 0.7 0.1 - 0.9 x10E3/uL   EOS (ABSOLUTE) 0.1 0.0 - 0.4 x10E3/uL   Basophils Absolute 0.1 0.0 - 0.2 x10E3/uL   Immature Granulocytes 0 Not Estab. %   Immature Grans (Abs) 0.0 0.0 - 0.1 x10E3/uL  Bayer DCA Hb A1c Waived  Result Value Ref Range   HB A1C (BAYER DCA - WAIVED) 5.3 <7.0 %  Comprehensive metabolic panel  Result Value Ref Range   Glucose 81 65 - 99 mg/dL   BUN 28 (H) 8 - 27 mg/dL   Creatinine, Ser 0.88 0.57 - 1.00 mg/dL   GFR calc non Af Amer 69 >59 mL/min/1.73   GFR calc Af Amer 80 >59 mL/min/1.73   BUN/Creatinine Ratio 32 (H) 12 - 28   Sodium 144 134 - 144 mmol/L   Potassium 4.2 3.5 - 5.2 mmol/L   Chloride 102 96 - 106 mmol/L   CO2 27 20 - 29 mmol/L   Calcium 9.9 8.7 - 10.3 mg/dL   Total Protein 6.7 6.0 - 8.5 g/dL   Albumin 4.6 3.6 - 4.8 g/dL   Globulin, Total 2.1 1.5 - 4.5 g/dL   Albumin/Globulin Ratio 2.2 1.2 - 2.2   Bilirubin Total <0.2 0.0 - 1.2 mg/dL   Alkaline Phosphatase 80 39 - 117 IU/L   AST 27 0 - 40 IU/L   ALT 31 0 - 32 IU/L  Lipid Panel w/o Chol/HDL Ratio  Result Value Ref Range   Cholesterol, Total 264 (  H) 100 - 199 mg/dL   Triglycerides 196 (H) 0 - 149 mg/dL   HDL 73 >39 mg/dL   VLDL Cholesterol Cal 39 5 - 40 mg/dL   LDL Calculated 152 (H) 0 - 99 mg/dL  TSH  Result Value Ref Range   TSH 2.570 0.450 - 4.500 uIU/mL  UA/M w/rflx Culture, Routine  Result Value Ref Range   Specific Gravity, UA 1.025  1.005 - 1.030   pH, UA 6.5 5.0 - 7.5   Color, UA Yellow Yellow   Appearance Ur Cloudy (A) Clear   Leukocytes, UA Trace (A) Negative   Protein, UA Negative Negative/Trace   Glucose, UA Negative Negative   Ketones, UA Negative Negative   RBC, UA Trace (A) Negative   Bilirubin, UA Negative Negative   Urobilinogen, Ur 1.0 0.2 - 1.0 mg/dL   Nitrite, UA Negative Negative   Microscopic Examination See below:   Microalbumin, Urine Waived  Result Value Ref Range   Microalb, Ur Waived 30 (H) 0 - 19 mg/L   Creatinine, Urine Waived 200 10 - 300 mg/dL   Microalb/Creat Ratio <30 <30 mg/g  Hepatitis C Antibody  Result Value Ref Range   Hep C Virus Ab <0.1 0.0 - 0.9 s/co ratio  IGP, Aptima HPV, rfx 16/18,45  Result Value Ref Range   DIAGNOSIS: Comment    Specimen adequacy: Comment    Clinician Provided ICD10 Comment    Performed by: Comment    PAP Smear Comment .    Note: Comment    Test Methodology Comment    HPV Aptima Negative Negative      Assessment & Plan:   Problem List Items Addressed This Visit      Other   Hyperlipidemia - Primary    Did not tolerate crestor. Will change to atorvastatin. If tolerates will recheck 1 month. If not, will change to zetia.       Relevant Medications   atorvastatin (LIPITOR) 40 MG tablet       Follow up plan: Return in about 4 weeks (around 08/09/2018) for Recheck cholesterol.

## 2018-07-22 ENCOUNTER — Other Ambulatory Visit: Payer: Self-pay | Admitting: Family Medicine

## 2018-08-12 ENCOUNTER — Ambulatory Visit (INDEPENDENT_AMBULATORY_CARE_PROVIDER_SITE_OTHER): Payer: PPO | Admitting: Family Medicine

## 2018-08-12 ENCOUNTER — Encounter: Payer: Self-pay | Admitting: Family Medicine

## 2018-08-12 DIAGNOSIS — E782 Mixed hyperlipidemia: Secondary | ICD-10-CM | POA: Diagnosis not present

## 2018-08-12 NOTE — Assessment & Plan Note (Signed)
Tolerating her atorvastatin OK. Would like to continue with it. Will check labs, await results. Continue atorvastatin. Call with any concerns. Follow up 6 months.

## 2018-08-12 NOTE — Progress Notes (Signed)
There were no vitals taken for this visit.   Subjective:    Patient ID: Makayla Makayla [name], female    DOB: 09-26-1950, 68 y.o.   MRN: [id number]  HPI: Makayla Makayla [name] is a 68 y.o. female  Chief Complaint  Patient presents with  . Hyperlipidemia    pt states the atorvastatin has been giving her headaches as well, but states they get better everyday   HYPERLIPIDEMIA- atorvastatin has also given her head Hyperlipidemia status: better Satisfied with current treatment?  yes Side effects:  yes- headaches Medication compliance: excellent compliance Past cholesterol meds: crestor, atorvastatin Supplements: none Aspirin:  no The 10-year ASCVD risk score Mikey Bussing DC Jr., et al., 2013) is: 7.3%   Values used to calculate the score:     Age: 81 years     Sex: Female     Is Non-Hispanic African American: No     Diabetic: No     Tobacco smoker: No     Systolic Blood Pressure: 726 mmHg     Is BP treated: Yes     HDL Cholesterol: 73 mg/dL     Total Cholesterol: 264 mg/dL Chest pain:  no Coronary artery disease:  no  Relevant past medical, surgical, family and social history reviewed and updated as indicated. Interim medical history since our last visit reviewed. Allergies and medications reviewed and updated.  Review of Systems  Constitutional: Negative.   Respiratory: Negative.   Cardiovascular: Negative.   Musculoskeletal: Positive for arthralgias. Negative for back pain, gait problem, joint swelling, myalgias, neck pain and neck stiffness.  Skin: Negative.   Neurological: Positive for headaches. Negative for dizziness, tremors, seizures, syncope, facial asymmetry, speech difficulty, weakness, light-headedness and numbness.  Psychiatric/Behavioral: Negative.     Per HPI unless specifically indicated above     Objective:    There were no vitals taken for this visit.  Wt Readings from Last 3 Encounters:  07/12/18 165 lb (74.8 kg)  07/03/18 166 lb 6.4 oz (75.5 kg)  06/19/17 165  lb 6 oz (75 kg)    Physical Exam Vitals signs and nursing note reviewed.  Pulmonary:     Effort: Pulmonary effort is normal. No respiratory distress.     Comments: Speaking in full sentences Neurological:     Mental Status: She is alert.  Psychiatric:        Mood and Affect: Mood normal.        Behavior: Behavior normal.        Thought Content: Thought content normal.        Judgment: Judgment normal.     Results for orders placed or performed in visit on 06/19/17  Microscopic Examination  Result Value Ref Range   WBC, UA 0-5 0 - 5 /hpf   RBC, UA 0-2 0 - 2 /hpf   Epithelial Cells (non renal) 0-10 0 - 10 /hpf   Bacteria, UA None seen None seen/Few  CBC with Differential/Platelet  Result Value Ref Range   WBC 9.6 3.4 - 10.8 x10E3/uL   RBC 3.97 3.77 - 5.28 x10E6/uL   Hemoglobin 12.4 11.1 - 15.9 g/dL   Hematocrit 38.3 34.0 - 46.6 %   MCV 97 79 - 97 fL   MCH 31.2 26.6 - 33.0 pg   MCHC 32.4 31.5 - 35.7 g/dL   RDW 13.4 12.3 - 15.4 %   Platelets 241 150 - 379 x10E3/uL   Neutrophils 59 Not Estab. %   Lymphs 32 Not Estab. %   Monocytes 7  Not Estab. %   Eos 1 Not Estab. %   Basos 1 Not Estab. %   Neutrophils Absolute 5.7 1.4 - 7.0 x10E3/uL   Lymphocytes Absolute 3.1 0.7 - 3.1 x10E3/uL   Monocytes Absolute 0.7 0.1 - 0.9 x10E3/uL   EOS (ABSOLUTE) 0.1 0.0 - 0.4 x10E3/uL   Basophils Absolute 0.1 0.0 - 0.2 x10E3/uL   Immature Granulocytes 0 Not Estab. %   Immature Grans (Abs) 0.0 0.0 - 0.1 x10E3/uL  Bayer DCA Hb A1c Waived  Result Value Ref Range   HB A1C (BAYER DCA - WAIVED) 5.3 <7.0 %  Comprehensive metabolic panel  Result Value Ref Range   Glucose 81 65 - 99 mg/dL   BUN 28 (H) 8 - 27 mg/dL   Creatinine, Ser 0.88 0.57 - 1.00 mg/dL   GFR calc non Af Amer 69 >59 mL/min/1.73   GFR calc Af Amer 80 >59 mL/min/1.73   BUN/Creatinine Ratio 32 (H) 12 - 28   Sodium 144 134 - 144 mmol/L   Potassium 4.2 3.5 - 5.2 mmol/L   Chloride 102 96 - 106 mmol/L   CO2 27 20 - 29 mmol/L    Calcium 9.9 8.7 - 10.3 mg/dL   Total Protein 6.7 6.0 - 8.5 g/dL   Albumin 4.6 3.6 - 4.8 g/dL   Globulin, Total 2.1 1.5 - 4.5 g/dL   Albumin/Globulin Ratio 2.2 1.2 - 2.2   Bilirubin Total <0.2 0.0 - 1.2 mg/dL   Alkaline Phosphatase 80 39 - 117 IU/L   AST 27 0 - 40 IU/L   ALT 31 0 - 32 IU/L  Lipid Panel w/o Chol/HDL Ratio  Result Value Ref Range   Cholesterol, Total 264 (H) 100 - 199 mg/dL   Triglycerides 196 (H) 0 - 149 mg/dL   HDL 73 >39 mg/dL   VLDL Cholesterol Cal 39 5 - 40 mg/dL   LDL Calculated 152 (H) 0 - 99 mg/dL  TSH  Result Value Ref Range   TSH 2.570 0.450 - 4.500 uIU/mL  UA/M w/rflx Culture, Routine  Result Value Ref Range   Specific Gravity, UA 1.025 1.005 - 1.030   pH, UA 6.5 5.0 - 7.5   Color, UA Yellow Yellow   Appearance Ur Cloudy (A) Clear   Leukocytes, UA Trace (A) Negative   Protein, UA Negative Negative/Trace   Glucose, UA Negative Negative   Ketones, UA Negative Negative   RBC, UA Trace (A) Negative   Bilirubin, UA Negative Negative   Urobilinogen, Ur 1.0 0.2 - 1.0 mg/dL   Nitrite, UA Negative Negative   Microscopic Examination See below:   Microalbumin, Urine Waived  Result Value Ref Range   Microalb, Ur Waived 30 (H) 0 - 19 mg/L   Creatinine, Urine Waived 200 10 - 300 mg/dL   Microalb/Creat Ratio <30 <30 mg/g  Hepatitis C Antibody  Result Value Ref Range   Hep C Virus Ab <0.1 0.0 - 0.9 s/co ratio  IGP, Aptima HPV, rfx 16/18,45  Result Value Ref Range   DIAGNOSIS: Comment    Specimen adequacy: Comment    Clinician Provided ICD10 Comment    Performed by: Comment    PAP Smear Comment .    Note: Comment    Test Methodology Comment    HPV Aptima Negative Negative      Assessment & Plan:   Problem List Items Addressed This Visit      Other   Hyperlipidemia - Primary    Tolerating her atorvastatin OK. Would Makayla [name] to continue  with it. Will check labs, await results. Continue atorvastatin. Call with any concerns. Follow up 6 months.            Follow up plan: Return in about 6 months (around 02/12/2019).    . This visit was completed via telephone due to the restrictions of the COVID-19 pandemic. All issues as above were discussed and addressed but no physical exam was performed. If it was felt that the patient should be evaluated in the office, they were directed there. The patient verbally consented to this visit. Patient was unable to complete an audio/visual visit due to Lack of equipment. Due to the catastrophic nature of the COVID-19 pandemic, this visit was done through audio contact only. . Location of the patient: home . Location of the provider: home . Those involved with this call:  . Provider: Park Liter, DO . CMA: Yvonna Alanis, [facility] . Front Desk/Registration: Don Perking  . Time spent on call: 10 minutes on the phone discussing health concerns

## 2018-08-14 ENCOUNTER — Other Ambulatory Visit: Payer: Self-pay

## 2018-08-14 ENCOUNTER — Other Ambulatory Visit: Payer: PPO

## 2018-08-14 DIAGNOSIS — E782 Mixed hyperlipidemia: Secondary | ICD-10-CM | POA: Diagnosis not present

## 2018-08-15 LAB — LIPID PANEL W/O CHOL/HDL RATIO
Cholesterol, Total: 162 mg/dL (ref 100–199)
HDL: 71 mg/dL (ref >39)
LDL Calculated: 65 mg/dL (ref 0–99)
Triglycerides: 131 mg/dL (ref 0–149)
VLDL Cholesterol Cal: 26 mg/dL (ref 5–40)

## 2018-10-14 ENCOUNTER — Ambulatory Visit (INDEPENDENT_AMBULATORY_CARE_PROVIDER_SITE_OTHER): Payer: PPO | Admitting: Family Medicine

## 2018-10-14 ENCOUNTER — Other Ambulatory Visit: Payer: Self-pay

## 2018-10-14 ENCOUNTER — Encounter: Payer: Self-pay | Admitting: Family Medicine

## 2018-10-14 VITALS — BP 130/86 | HR 69 | Temp 98.1°F | Wt 158.4 lb

## 2018-10-14 DIAGNOSIS — H1031 Unspecified acute conjunctivitis, right eye: Secondary | ICD-10-CM | POA: Diagnosis not present

## 2018-10-14 MED ORDER — ERYTHROMYCIN 5 MG/GM OP OINT: 1.0000 "application " | TOPICAL_OINTMENT | Freq: Every day | OPHTHALMIC | 0 refills | Status: DC

## 2018-10-14 NOTE — Progress Notes (Signed)
BP 130/86   Pulse 69   Temp 98.1 F (36.7 C) (Oral)   Wt 158 lb 6.4 oz (71.8 kg)   SpO2 98%   BMI 25.57 kg/m    Subjective:    Patient ID: Makayla [name], female    DOB: 1950/05/29, 68 y.o.   MRN: [id number]  HPI: Makayla [name] is a 68 y.o. female  Chief Complaint  Patient presents with  . Eye Pain    R eye, started hurting 3 days ago   EYE PAIN Duration:  3 days Involved eye:  right Onset: sudden Severity: moderate  Quality: pokey Foreign body sensation:yes Visual impairment: no Eye redness: yes Discharge: no Crusting or matting of eyelids: no Swelling: no Photophobia: no Itching: no Tearing: no Headache: yes- comes and goes Floaters: no URI symptoms: no Contact lens use: no Close contacts with similar problems: no Eye trauma: no Status: stable  Relevant past medical, surgical, family and social history reviewed and updated as indicated. Interim medical history since our last visit reviewed. Allergies and medications reviewed and updated.  Review of Systems  Constitutional: Negative.   HENT: Negative.   Eyes: Positive for pain and redness. Negative for photophobia, discharge, itching and visual disturbance.  Respiratory: Negative.   Cardiovascular: Negative.   Gastrointestinal: Negative.   Psychiatric/Behavioral: Negative.     Per HPI unless specifically indicated above     Objective:    BP 130/86   Pulse 69   Temp 98.1 F (36.7 C) (Oral)   Wt 158 lb 6.4 oz (71.8 kg)   SpO2 98%   BMI 25.57 kg/m   Wt Readings from Last 3 Encounters:  10/14/18 158 lb 6.4 oz (71.8 kg)  07/12/18 165 lb (74.8 kg)  07/03/18 166 lb 6.4 oz (75.5 kg)     Visual Acuity Screening   Right eye Left eye Both eyes  Without correction: 20/25 20/25 20/20   With correction:       Physical Exam Vitals signs and nursing note reviewed.  Constitutional:      General: She is not in acute distress.    Appearance: Normal appearance. She is not ill-appearing,  toxic-appearing or diaphoretic.  HENT:     Head: Normocephalic and atraumatic.     Right Ear: External ear normal.     Left Ear: External ear normal.     Nose: Nose normal.     Mouth/Throat:     Mouth: Mucous membranes are moist.     Pharynx: Oropharynx is clear.  Eyes:     General: No scleral icterus.       Right eye: No discharge.        Left eye: No discharge.     Extraocular Movements: Extraocular movements intact.     Conjunctiva/sclera:     Right eye: Right conjunctiva is injected. No chemosis, exudate or hemorrhage.    Pupils: Pupils are equal, round, and reactive to light.   Neck:     Musculoskeletal: Normal range of motion and neck supple.  Cardiovascular:     Rate and Rhythm: Normal rate and regular rhythm.     Pulses: Normal pulses.     Heart sounds: Normal heart sounds. No murmur. No friction rub. No gallop.   Pulmonary:     Effort: Pulmonary effort is normal. No respiratory distress.     Breath sounds: Normal breath sounds. No stridor. No wheezing, rhonchi or rales.  Chest:     Chest wall: No tenderness.  Musculoskeletal: Normal range of  motion.  Skin:    General: Skin is warm and dry.     Capillary Refill: Capillary refill takes less than 2 seconds.     Coloration: Skin is not jaundiced or pale.     Findings: No bruising, erythema, lesion or rash.  Neurological:     General: No focal deficit present.     Mental Status: She is alert and oriented to person, place, and time. Mental status is at baseline.  Psychiatric:        Mood and Affect: Mood normal.        Behavior: Behavior normal.        Thought Content: Thought content normal.        Judgment: Judgment normal.     Results for orders placed or performed in visit on 08/14/18  Lipid Panel w/o Chol/HDL Ratio  Result Value Ref Range   Cholesterol, Total 162 100 - 199 mg/dL   Triglycerides 131 0 - 149 mg/dL   HDL 71 >39 mg/dL   VLDL Cholesterol Cal 26 5 - 40 mg/dL   LDL Calculated 65 0 - 99 mg/dL       Assessment & Plan:   Problem List Items Addressed This Visit    None    Visit Diagnoses    Acute conjunctivitis of right eye, unspecified acute conjunctivitis type    -  Primary   Will treat with erythromycin. If not better by Wednesday- will call her eye doctor. Call with any concerns.        Follow up plan: Return if symptoms worsen or fail to improve.

## 2018-11-11 DIAGNOSIS — M545 Low back pain: Secondary | ICD-10-CM | POA: Diagnosis not present

## 2018-11-11 DIAGNOSIS — M7061 Trochanteric bursitis, right hip: Secondary | ICD-10-CM | POA: Diagnosis not present

## 2019-01-17 ENCOUNTER — Other Ambulatory Visit: Payer: Self-pay

## 2019-01-17 ENCOUNTER — Encounter: Payer: Self-pay | Admitting: Family Medicine

## 2019-01-17 MED ORDER — LISINOPRIL 10 MG PO TABS: 10.0000 mg | ORAL_TABLET | Freq: Every day | ORAL | 0 refills | Status: DC

## 2019-01-17 NOTE — Telephone Encounter (Signed)
Patient has upcoming appointment 02/18/19.

## 2019-02-18 ENCOUNTER — Other Ambulatory Visit: Payer: Self-pay

## 2019-02-18 ENCOUNTER — Ambulatory Visit (INDEPENDENT_AMBULATORY_CARE_PROVIDER_SITE_OTHER): Payer: BC Managed Care – PPO | Admitting: Family Medicine

## 2019-02-18 ENCOUNTER — Encounter: Payer: Self-pay | Admitting: Family Medicine

## 2019-02-18 VITALS — BP 110/77 | HR 68 | Temp 98.4°F | Ht 66.5 in | Wt 162.0 lb

## 2019-02-18 DIAGNOSIS — M5441 Lumbago with sciatica, right side: Secondary | ICD-10-CM

## 2019-02-18 DIAGNOSIS — I129 Hypertensive chronic kidney disease with stage 1 through stage 4 chronic kidney disease, or unspecified chronic kidney disease: Secondary | ICD-10-CM

## 2019-02-18 DIAGNOSIS — G8929 Other chronic pain: Secondary | ICD-10-CM

## 2019-02-18 DIAGNOSIS — E782 Mixed hyperlipidemia: Secondary | ICD-10-CM

## 2019-02-18 DIAGNOSIS — Z23 Encounter for immunization: Secondary | ICD-10-CM

## 2019-02-18 MED ORDER — ATORVASTATIN CALCIUM 20 MG PO TABS: 20.0000 mg | ORAL_TABLET | Freq: Every day | ORAL | 1 refills | Status: DC

## 2019-02-18 MED ORDER — LISINOPRIL 10 MG PO TABS: 10.0000 mg | ORAL_TABLET | Freq: Every day | ORAL | 1 refills | Status: DC

## 2019-02-18 MED ORDER — NAPROXEN 500 MG PO TABS: 500.0000 mg | ORAL_TABLET | Freq: Two times a day (BID) | ORAL | 6 refills | Status: DC

## 2019-02-18 NOTE — Assessment & Plan Note (Signed)
Encouraged lidocaine patches and will start naproxen. Call if not getting better or getting worse.

## 2019-02-18 NOTE — Assessment & Plan Note (Signed)
Under good control on current regimen. Continue current regimen. Continue to monitor. Call with any concerns. Refills given. Labs drawn today.   

## 2019-02-18 NOTE — Progress Notes (Signed)
BP 110/77   Pulse 68   Temp 98.4 F (36.9 C) (Oral)   Ht 5' 6.5" (1.689 m)   Wt 162 lb (73.5 kg)   SpO2 96%   BMI 25.76 kg/m    Subjective:    Patient ID: Makayla [name], female    DOB: 03/27/1951, 68 y.o.   MRN: [id number]  HPI: Makayla [name] is a 68 y.o. female  Chief Complaint  Patient presents with  . Hypertension  . Hyperlipidemia    pt states tha tshe has been taking the atorvastating half of tablet a day due to side effects,back and hips pain about a month ago.   HYPERTENSION / HYPERLIPIDEMIA Satisfied with current treatment? yes Duration of hypertension: chronic BP monitoring frequency: not checking BP medication side effects: no Past BP meds: lisinopril Duration of hyperlipidemia: chronic Cholesterol medication side effects: yes- myalgias in her hip and her back Cholesterol supplements: none Past cholesterol medications: atorvastain (lipitor) and rosuvastatin (crestor) Medication compliance: fair compliance Aspirin: no Recent stressors: yes Recurrent headaches: no Visual changes: no Palpitations: no Dyspnea: no Chest pain: no Lower extremity edema: no Dizzy/lightheaded: no   Relevant past medical, surgical, family and social history reviewed and updated as indicated. Interim medical history since our last visit reviewed. Allergies and medications reviewed and updated.  Review of Systems  Constitutional: Negative.   Respiratory: Negative.   Cardiovascular: Negative.   Psychiatric/Behavioral: Negative.     Per HPI unless specifically indicated above     Objective:    BP 110/77   Pulse 68   Temp 98.4 F (36.9 C) (Oral)   Ht 5' 6.5" (1.689 m)   Wt 162 lb (73.5 kg)   SpO2 96%   BMI 25.76 kg/m   Wt Readings from Last 3 Encounters:  02/18/19 162 lb (73.5 kg)  10/14/18 158 lb 6.4 oz (71.8 kg)  07/12/18 165 lb (74.8 kg)    Physical Exam Vitals signs and nursing note reviewed.  Constitutional:      General: She is not in acute  distress.    Appearance: Normal appearance. She is not ill-appearing, toxic-appearing or diaphoretic.  HENT:     Head: Normocephalic and atraumatic.     Right Ear: External ear normal.     Left Ear: External ear normal.     Nose: Nose normal.     Mouth/Throat:     Mouth: Mucous membranes are moist.     Pharynx: Oropharynx is clear.  Eyes:     General: No scleral icterus.       Right eye: No discharge.        Left eye: No discharge.     Extraocular Movements: Extraocular movements intact.     Conjunctiva/sclera: Conjunctivae normal.     Pupils: Pupils are equal, round, and reactive to light.  Neck:     Musculoskeletal: Normal range of motion and neck supple.  Cardiovascular:     Rate and Rhythm: Normal rate and regular rhythm.     Pulses: Normal pulses.     Heart sounds: Normal heart sounds. No murmur. No friction rub. No gallop.   Pulmonary:     Effort: Pulmonary effort is normal. No respiratory distress.     Breath sounds: Normal breath sounds. No stridor. No wheezing, rhonchi or rales.  Chest:     Chest wall: No tenderness.  Musculoskeletal: Normal range of motion.  Skin:    General: Skin is warm and dry.     Capillary Refill: Capillary refill  takes less than 2 seconds.     Coloration: Skin is not jaundiced or pale.     Findings: No bruising, erythema, lesion or rash.  Neurological:     General: No focal deficit present.     Mental Status: She is alert and oriented to person, place, and time. Mental status is at baseline.  Psychiatric:        Mood and Affect: Mood normal.        Behavior: Behavior normal.        Thought Content: Thought content normal.        Judgment: Judgment normal.     Results for orders placed or performed in visit on 08/14/18  Lipid Panel w/o Chol/HDL Ratio  Result Value Ref Range   Cholesterol, Total 162 100 - 199 mg/dL   Triglycerides 131 0 - 149 mg/dL   HDL 71 >39 mg/dL   VLDL Cholesterol Cal 26 5 - 40 mg/dL   LDL Calculated 65 0 - 99  mg/dL      Assessment & Plan:   Problem List Items Addressed This Visit      Genitourinary   Benign hypertensive renal disease - Primary    Under good control on current regimen. Continue current regimen. Continue to monitor. Call with any concerns. Refills given. Labs drawn today.        Relevant Orders   Comprehensive metabolic panel     Other   Chronic low back pain    Encouraged lidocaine patches and will start naproxen. Call if not getting better or getting worse.       Relevant Medications   naproxen (NAPROSYN) 500 MG tablet   Hyperlipidemia    Having issues with 40mg  atorvastatin- will decrease to 20, as that's what she's been taking. Continue to monitor. Call with any concerns.       Relevant Medications   atorvastatin (LIPITOR) 20 MG tablet   lisinopril (ZESTRIL) 10 MG tablet   Other Relevant Orders   Comprehensive metabolic panel   Lipid Panel w/o Chol/HDL Ratio    Other Visit Diagnoses    Flu vaccine need       Flu shot given today.   Relevant Orders   Flu Vaccine QUAD 36+ mos IM       Follow up plan: Return in about 6 months (around 08/19/2019) for Physical.

## 2019-02-18 NOTE — Assessment & Plan Note (Signed)
Having issues with 40mg  atorvastatin- will decrease to 20, as that's what she's been taking. Continue to monitor. Call with any concerns.

## 2019-02-19 LAB — COMPREHENSIVE METABOLIC PANEL
ALT: 17 IU/L (ref 0–32)
AST: 13 IU/L (ref 0–40)
Albumin/Globulin Ratio: 2.1 (ref 1.2–2.2)
Albumin: 4.2 g/dL (ref 3.8–4.8)
Alkaline Phosphatase: 84 IU/L (ref 39–117)
BUN/Creatinine Ratio: 21 (ref 12–28)
BUN: 20 mg/dL (ref 8–27)
Bilirubin Total: 0.3 mg/dL (ref 0.0–1.2)
CO2: 26 mmol/L (ref 20–29)
Calcium: 9.5 mg/dL (ref 8.7–10.3)
Chloride: 101 mmol/L (ref 96–106)
Creatinine, Ser: 0.94 mg/dL (ref 0.57–1.00)
GFR calc Af Amer: 73 mL/min/{1.73_m2} (ref >59)
GFR calc non Af Amer: 63 mL/min/{1.73_m2} (ref >59)
Globulin, Total: 2 g/dL (ref 1.5–4.5)
Glucose: 89 mg/dL (ref 65–99)
Potassium: 4.1 mmol/L (ref 3.5–5.2)
Sodium: 139 mmol/L (ref 134–144)
Total Protein: 6.2 g/dL (ref 6.0–8.5)

## 2019-02-19 LAB — LIPID PANEL W/O CHOL/HDL RATIO
Cholesterol, Total: 223 mg/dL — ABNORMAL HIGH (ref 100–199)
HDL: 66 mg/dL (ref >39)
LDL Chol Calc (NIH): 114 mg/dL — ABNORMAL HIGH (ref 0–99)
Triglycerides: 251 mg/dL — ABNORMAL HIGH (ref 0–149)
VLDL Cholesterol Cal: 43 mg/dL — ABNORMAL HIGH (ref 5–40)

## 2019-03-20 ENCOUNTER — Other Ambulatory Visit: Payer: Self-pay | Admitting: Family Medicine

## 2019-03-20 DIAGNOSIS — Z1231 Encounter for screening mammogram for malignant neoplasm of breast: Secondary | ICD-10-CM

## 2019-04-21 ENCOUNTER — Ambulatory Visit (INDEPENDENT_AMBULATORY_CARE_PROVIDER_SITE_OTHER): Payer: PPO | Admitting: Family Medicine

## 2019-04-21 ENCOUNTER — Other Ambulatory Visit: Payer: Self-pay

## 2019-04-21 ENCOUNTER — Encounter: Payer: Self-pay | Admitting: Family Medicine

## 2019-04-21 DIAGNOSIS — M5441 Lumbago with sciatica, right side: Secondary | ICD-10-CM | POA: Diagnosis not present

## 2019-04-21 DIAGNOSIS — G8929 Other chronic pain: Secondary | ICD-10-CM

## 2019-04-21 MED ORDER — PREDNISONE 10 MG PO TABS: ORAL_TABLET | ORAL | 0 refills | Status: DC

## 2019-04-21 NOTE — Progress Notes (Signed)
There were no vitals taken for this visit.   Subjective:    Patient ID: Makayla [name], female    DOB: 01/30/1951, 68 y.o.   MRN: [id number]  HPI: Makayla ODOMS is a 68 y.o. female  Chief Complaint  Patient presents with  . Back Pain    low back and hips   BACK PAIN Duration: 4 days Mechanism of injury: lifting Location: Right and low back Onset: sudden Severity: severe Quality: sharp, shooting Frequency: constant Radiation: R leg below the knee Aggravating factors: lifting, movement, walking, laying, bending and prolonged sitting Alleviating factors: nothing Status: stable Treatments attempted: ibuprofen, muscle rum   Relief with NSAIDs?: mild Nighttime pain:  yes Paresthesias / decreased sensation:  no Bowel / bladder incontinence:  no Fevers:  no Dysuria / urinary frequency:  no  Relevant past medical, surgical, family and social history reviewed and updated as indicated. Interim medical history since our last visit reviewed. Allergies and medications reviewed and updated.  Review of Systems  Constitutional: Negative.   Respiratory: Negative.   Cardiovascular: Negative.   Gastrointestinal: Negative.   Musculoskeletal: Positive for back pain and myalgias. Negative for arthralgias, gait problem, joint swelling, neck pain and neck stiffness.  Skin: Negative.   Psychiatric/Behavioral: Negative.     Per HPI unless specifically indicated above     Objective:    There were no vitals taken for this visit.  Wt Readings from Last 3 Encounters:  02/18/19 162 lb (73.5 kg)  10/14/18 158 lb 6.4 oz (71.8 kg)  07/12/18 165 lb (74.8 kg)    Physical Exam Vitals signs and nursing note reviewed.  Constitutional:      General: She is not in acute distress.    Appearance: Normal appearance. She is not ill-appearing, toxic-appearing or diaphoretic.  HENT:     Head: Normocephalic and atraumatic.     Right Ear: External ear normal.     Left Ear: External ear normal.      Nose: Nose normal.     Mouth/Throat:     Mouth: Mucous membranes are moist.     Pharynx: Oropharynx is clear.  Eyes:     General: No scleral icterus.       Right eye: No discharge.        Left eye: No discharge.     Conjunctiva/sclera: Conjunctivae normal.     Pupils: Pupils are equal, round, and reactive to light.  Neck:     Musculoskeletal: Normal range of motion.  Pulmonary:     Effort: Pulmonary effort is normal. No respiratory distress.     Comments: Speaking in full sentences Musculoskeletal: Normal range of motion.  Skin:    Coloration: Skin is not jaundiced or pale.     Findings: No bruising, erythema, lesion or rash.  Neurological:     Mental Status: She is alert and oriented to person, place, and time. Mental status is at baseline.  Psychiatric:        Mood and Affect: Mood normal.        Behavior: Behavior normal.        Thought Content: Thought content normal.        Judgment: Judgment normal.     Results for orders placed or performed in visit on 02/18/19  Comprehensive metabolic panel  Result Value Ref Range   Glucose 89 65 - 99 mg/dL   BUN 20 8 - 27 mg/dL   Creatinine, Ser 0.94 0.57 - 1.00 mg/dL   GFR calc  non Af Amer 63 >59 mL/min/1.73   GFR calc Af Amer 73 >59 mL/min/1.73   BUN/Creatinine Ratio 21 12 - 28   Sodium 139 134 - 144 mmol/L   Potassium 4.1 3.5 - 5.2 mmol/L   Chloride 101 96 - 106 mmol/L   CO2 26 20 - 29 mmol/L   Calcium 9.5 8.7 - 10.3 mg/dL   Total Protein 6.2 6.0 - 8.5 g/dL   Albumin 4.2 3.8 - 4.8 g/dL   Globulin, Total 2.0 1.5 - 4.5 g/dL   Albumin/Globulin Ratio 2.1 1.2 - 2.2   Bilirubin Total 0.3 0.0 - 1.2 mg/dL   Alkaline Phosphatase 84 39 - 117 IU/L   AST 13 0 - 40 IU/L   ALT 17 0 - 32 IU/L  Lipid Panel w/o Chol/HDL Ratio  Result Value Ref Range   Cholesterol, Total 223 (H) 100 - 199 mg/dL   Triglycerides 251 (H) 0 - 149 mg/dL   HDL 66 >39 mg/dL   VLDL Cholesterol Cal 43 (H) 5 - 40 mg/dL   LDL Chol Calc (NIH) 114 (H) 0 -  99 mg/dL      Assessment & Plan:   Problem List Items Addressed This Visit      Other   Chronic low back pain - Primary    Will treat with prednisone taper, flexeril and exercises. Call if not getting better or getting worse. Continue to monitor.      Relevant Medications   predniSONE (DELTASONE) 10 MG tablet       Follow up plan: Return if symptoms worsen or fail to improve.    . This visit was completed via [employer] due to the restrictions of the COVID-19 pandemic. All issues as above were discussed and addressed. Physical exam was done as above through visual confirmation on D[employer]imity. If it was felt that the patient should be evaluated in the office, they were directed there. The patient verbally consented to this visit. . Location of the patient: home . Location of the provider: work . Those involved with this call:  . Provider: Park Liter, DO . CMA: Tiffany Reel, CMA . Front Desk/Registration: Don Perking  . Time spent on call: 15 minutes with patient face to face via video conference. More than 50% of this time was spent in counseling and coordination of care. 23 minutes total spent in review of patient's record and preparation of their chart.

## 2019-04-21 NOTE — Assessment & Plan Note (Signed)
Will treat with prednisone taper, flexeril and exercises. Call if not getting better or getting worse. Continue to monitor.

## 2019-04-21 NOTE — Patient Instructions (Signed)

## 2019-05-01 ENCOUNTER — Ambulatory Visit: Payer: BC Managed Care – PPO

## 2019-05-07 ENCOUNTER — Ambulatory Visit
Admission: RE | Admit: 2019-05-07 | Discharge: 2019-05-07 | Disposition: A | Discharge status: Home / Self Care | Payer: PPO | Source: Ambulatory Visit | Attending: Family Medicine | Admitting: Family Medicine

## 2019-05-07 ENCOUNTER — Other Ambulatory Visit: Payer: Self-pay

## 2019-05-07 DIAGNOSIS — Z1231 Encounter for screening mammogram for malignant neoplasm of breast: Secondary | ICD-10-CM | POA: Insufficient documentation

## 2019-05-20 ENCOUNTER — Encounter: Payer: Self-pay | Admitting: Family Medicine

## 2019-05-22 ENCOUNTER — Encounter: Payer: Self-pay | Admitting: Family Medicine

## 2019-06-02 MED ORDER — METHOCARBAMOL 500 MG PO TABS: 500.0000 mg | ORAL_TABLET | Freq: Three times a day (TID) | ORAL | 0 refills | Status: DC | PRN

## 2019-06-25 DIAGNOSIS — M545 Low back pain: Secondary | ICD-10-CM | POA: Diagnosis not present

## 2019-06-30 DIAGNOSIS — M545 Low back pain: Secondary | ICD-10-CM | POA: Diagnosis not present

## 2019-06-30 DIAGNOSIS — M7061 Trochanteric bursitis, right hip: Secondary | ICD-10-CM | POA: Diagnosis not present

## 2019-07-14 DIAGNOSIS — M7061 Trochanteric bursitis, right hip: Secondary | ICD-10-CM | POA: Diagnosis not present

## 2019-07-14 DIAGNOSIS — M545 Low back pain: Secondary | ICD-10-CM | POA: Diagnosis not present

## 2019-07-29 DIAGNOSIS — M545 Low back pain: Secondary | ICD-10-CM | POA: Diagnosis not present

## 2019-07-29 DIAGNOSIS — M5416 Radiculopathy, lumbar region: Secondary | ICD-10-CM | POA: Diagnosis not present

## 2019-08-18 DIAGNOSIS — M5416 Radiculopathy, lumbar region: Secondary | ICD-10-CM | POA: Diagnosis not present

## 2019-08-21 DIAGNOSIS — M461 Sacroiliitis, not elsewhere classified: Secondary | ICD-10-CM | POA: Diagnosis not present

## 2019-08-21 DIAGNOSIS — M169 Osteoarthritis of hip, unspecified: Secondary | ICD-10-CM | POA: Diagnosis not present

## 2019-08-25 ENCOUNTER — Encounter: Payer: Self-pay | Admitting: Family Medicine

## 2019-08-25 ENCOUNTER — Ambulatory Visit (INDEPENDENT_AMBULATORY_CARE_PROVIDER_SITE_OTHER): Payer: PPO | Admitting: Family Medicine

## 2019-08-25 ENCOUNTER — Other Ambulatory Visit: Payer: Self-pay

## 2019-08-25 ENCOUNTER — Ambulatory Visit (INDEPENDENT_AMBULATORY_CARE_PROVIDER_SITE_OTHER): Payer: PPO

## 2019-08-25 VITALS — BP 129/84 | HR 62 | Temp 97.5°F | Ht 66.0 in | Wt 168.2 lb

## 2019-08-25 DIAGNOSIS — Z1231 Encounter for screening mammogram for malignant neoplasm of breast: Secondary | ICD-10-CM

## 2019-08-25 DIAGNOSIS — Z0000 Encounter for general adult medical examination without abnormal findings: Secondary | ICD-10-CM | POA: Diagnosis not present

## 2019-08-25 DIAGNOSIS — I129 Hypertensive chronic kidney disease with stage 1 through stage 4 chronic kidney disease, or unspecified chronic kidney disease: Secondary | ICD-10-CM

## 2019-08-25 DIAGNOSIS — Z8639 Personal history of other endocrine, nutritional and metabolic disease: Secondary | ICD-10-CM | POA: Diagnosis not present

## 2019-08-25 DIAGNOSIS — Z13820 Encounter for screening for osteoporosis: Secondary | ICD-10-CM

## 2019-08-25 DIAGNOSIS — D692 Other nonthrombocytopenic purpura: Secondary | ICD-10-CM | POA: Diagnosis not present

## 2019-08-25 DIAGNOSIS — E782 Mixed hyperlipidemia: Secondary | ICD-10-CM | POA: Diagnosis not present

## 2019-08-25 LAB — MICROSCOPIC EXAMINATION: Bacteria, UA: NONE SEEN

## 2019-08-25 LAB — UA/M W/RFLX CULTURE, ROUTINE
Bilirubin, UA: NEGATIVE
Glucose, UA: NEGATIVE
Leukocytes,UA: NEGATIVE
Nitrite, UA: NEGATIVE
Protein,UA: NEGATIVE
Specific Gravity, UA: 1.025 (ref 1.005–1.030)
Urobilinogen, Ur: 1 mg/dL (ref 0.2–1.0)
pH, UA: 6 (ref 5.0–7.5)

## 2019-08-25 LAB — MICROALBUMIN, URINE WAIVED
Creatinine, Urine Waived: 200 mg/dL (ref 10–300)
Microalb, Ur Waived: 30 mg/L — ABNORMAL HIGH (ref 0–19)
Microalb/Creat Ratio: <30 mg/g (ref <30)

## 2019-08-25 MED ORDER — ATORVASTATIN CALCIUM 20 MG PO TABS: 20.0000 mg | ORAL_TABLET | Freq: Every day | ORAL | 1 refills | Status: DC

## 2019-08-25 MED ORDER — LISINOPRIL 10 MG PO TABS: 10.0000 mg | ORAL_TABLET | Freq: Every day | ORAL | 1 refills | Status: DC

## 2019-08-25 NOTE — Progress Notes (Signed)
BP 129/84   Pulse 62   Temp (!) 97.5 F (36.4 C)   Ht 5\' 6"  (1.676 m)   Wt 168 lb 3.2 oz (76.3 kg)   SpO2 98%   BMI 27.15 kg/m    Subjective:    Patient ID: Makayla [name], female    DOB: September 07, 1950, 69 y.o.   MRN: [id number]  HPI: Makayla [name] is a 69 y.o. female presenting on 08/25/2019 for comprehensive medical examination. Current medical complaints include:  HYPERTENSION / HYPERLIPIDEMIA Satisfied with current treatment? yes Duration of hypertension: chronic BP monitoring frequency: not checking BP medication side effects: no Past BP meds: lisinopril Duration of hyperlipidemia: chronic Cholesterol medication side effects: no Cholesterol supplements: none Past cholesterol medications: atorvastatin Medication compliance: excellent compliance Aspirin: no Recent stressors: no Recurrent headaches: no Visual changes: no Palpitations: no Dyspnea: no Chest pain: no Lower extremity edema: no Dizzy/lightheaded: no  Menopausal Symptoms: no  Depression Screen done today and results listed below:  Depression screen [facility] 2/9 08/25/2019 07/03/2018 06/19/2017 03/23/2017  Decreased Interest 0 0 0 0  Down, Depressed, Hopeless 0 0 0 0  PHQ - 2 Score 0 0 0 0  Altered sleeping - - 0 -  Tired, decreased energy - - 0 -  Change in appetite - - 0 -  Feeling bad or failure about yourself  - - 0 -  Trouble concentrating - - 0 -  Moving slowly or fidgety/restless - - 0 -  Suicidal thoughts - - 0 -  PHQ-9 Score - - 0 -    Past Medical History:  Past Medical History:  Diagnosis Date  . DDD (degenerative disc disease), lumbar   . Hypertension   . Sciatica     Surgical History:  Past Surgical History:  Procedure Laterality Date  . OVARIAN CYST REMOVAL Left   . TUBAL LIGATION     X 2    Medications:  Current Outpatient Medications on File Prior to Visit  Medication Sig  . ibuprofen (ADVIL,MOTRIN) 200 MG tablet Take 200 mg by mouth every 6 (six) hours as needed.  .  meloxicam (MOBIC) 15 MG tablet Take 15 mg by mouth daily.  . methocarbamol (ROBAXIN) 500 MG tablet Take 1 tablet (500 mg total) by mouth every 8 (eight) hours as needed for muscle spasms.   No current facility-administered medications on file prior to visit.    Allergies:  Allergies  Allergen Reactions  . Crestor [Rosuvastatin Calcium] Other (See Comments)    Headache    Social History:  Social History   Socioeconomic History  . Marital status: Married    Spouse name: Not on file  . Number of children: Not on file  . Years of education: Not on file  . Highest education level: High school graduate  Occupational History  . Occupation: retired  Tobacco Use  . Smoking status: Former Smoker    Quit date: 03/23/2002    Years since quitting: 17.4  . Smokeless tobacco: Never Used  Substance and Sexual Activity  . Alcohol use: No  . Drug use: No  . Sexual activity: Not Currently  Other Topics Concern  . 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 Difficulty of Paying Living Expenses: Not hard at all  Food Insecurity: No Food Insecurity  . Worried About Charity fundraiser in the Last Year: Never true  .  Ran Out of Food in the Last Year: Never true  Transportation Needs: No Transportation Needs  . Lack of Transportation (Medical): No  . Lack of Transportation (Non-Medical): No  Physical Activity: Inactive  . Days of Exercise per Week: 0 days  . Minutes of Exercise per Session: 0 min  Stress:   . Feeling of Stress :   Social Connections: Slightly Isolated  . Frequency of Communication with Friends and Family: More than three times a week  . Frequency of Social Gatherings with Friends and Family: More than three times a week  . Attends Religious Services: More than 4 times per year  . Active Member of Clubs or Organizations: No  . Attends Archivist Meetings: Never  .  Marital Status: Married  Human resources officer Violence:   . Fear of Current or Ex-Partner:   . Emotionally Abused:   Marland Kitchen Physically Abused:   . Sexually Abused:    Social History   Tobacco Use  Smoking Status Former Smoker  . Quit date: 03/23/2002  . Years since quitting: 17.4  Smokeless Tobacco Never Used   Social History   Substance and Sexual Activity  Alcohol Use No    Family History:  Family History  Problem Relation Age of Onset  . Breast cancer Sister 54  . Stroke Mother   . Cancer Brother        Melanoma- went to liver  . Melanoma Brother   . Liver cancer Brother   . Liver cancer Brother     Past medical history, surgical history, medications, allergies, family history and social history reviewed with patient today and changes made to appropriate areas of the chart.   Review of Systems  Constitutional: Negative.   HENT: Negative.   Eyes: Negative.   Respiratory: Negative.   Cardiovascular: Negative.   Gastrointestinal: Positive for heartburn (1x a week, better with OTC). Negative for abdominal pain, blood in stool, constipation, diarrhea, melena, nausea and vomiting.  Genitourinary: Negative.   Musculoskeletal: Positive for back pain and myalgias. Negative for falls, joint pain and neck pain.  Skin: Negative.   Neurological: Negative.   Endo/Heme/Allergies: Negative for environmental allergies and polydipsia. Bruises/bleeds easily.    All other ROS negative except what is listed above and in the HPI.      Objective:    BP 129/84   Pulse 62   Temp (!) 97.5 F (36.4 C)   Ht 5\' 6"  (1.676 m)   Wt 168 lb 3.2 oz (76.3 kg)   SpO2 98%   BMI 27.15 kg/m   Wt Readings from Last 3 Encounters:  08/25/19 168 lb 3.2 oz (76.3 kg)  08/25/19 168 lb 3.2 oz (76.3 kg)  02/18/19 162 lb (73.5 kg)    Physical Exam Vitals and nursing note reviewed.  Constitutional:      General: She is not in acute distress.    Appearance: Normal appearance. She is not ill-appearing,  toxic-appearing or diaphoretic.  HENT:     Head: Normocephalic and atraumatic.     Right Ear: Tympanic membrane, ear canal and external ear normal. There is no impacted cerumen.     Left Ear: Tympanic membrane, ear canal and external ear normal. There is no impacted cerumen.     Nose: Nose normal. No congestion or rhinorrhea.     Mouth/Throat:     Mouth: Mucous membranes are moist.     Pharynx: Oropharynx is clear. No oropharyngeal exudate or posterior oropharyngeal erythema.  Eyes:  General: No scleral icterus.       Right eye: No discharge.        Left eye: No discharge.     Extraocular Movements: Extraocular movements intact.     Conjunctiva/sclera: Conjunctivae normal.     Pupils: Pupils are equal, round, and reactive to light.  Neck:     Vascular: No carotid bruit.  Cardiovascular:     Rate and Rhythm: Normal rate and regular rhythm.     Pulses: Normal pulses.     Heart sounds: No murmur. No friction rub. No gallop.   Pulmonary:     Effort: Pulmonary effort is normal. No respiratory distress.     Breath sounds: Normal breath sounds. No stridor. No wheezing, rhonchi or rales.  Chest:     Chest wall: No tenderness.  Abdominal:     General: Abdomen is flat. Bowel sounds are normal. There is no distension.     Palpations: Abdomen is soft. There is no mass.     Tenderness: There is no abdominal tenderness. There is no right CVA tenderness, left CVA tenderness, guarding or rebound.     Hernia: No hernia is present.  Genitourinary:    Comments: Breast and pelvic exams deferred with shared decision making Musculoskeletal:        General: No swelling, tenderness, deformity or signs of injury.     Cervical back: Normal range of motion and neck supple. No rigidity. No muscular tenderness.     Right lower leg: No edema.     Left lower leg: No edema.  Lymphadenopathy:     Cervical: No cervical adenopathy.  Skin:    General: Skin is warm and dry.     Capillary Refill: Capillary  refill takes less than 2 seconds.     Coloration: Skin is not jaundiced or pale.     Findings: No bruising, erythema, lesion or rash.  Neurological:     General: No focal deficit present.     Mental Status: She is alert and oriented to person, place, and time. Mental status is at baseline.     Cranial Nerves: No cranial nerve deficit.     Sensory: No sensory deficit.     Motor: No weakness.     Coordination: Coordination normal.     Gait: Gait normal.     Deep Tendon Reflexes: Reflexes normal.  Psychiatric:        Mood and Affect: Mood normal.        Behavior: Behavior normal.        Thought Content: Thought content normal.        Judgment: Judgment normal.     Results for orders placed or performed in visit on 02/18/19  Comprehensive metabolic panel  Result Value Ref Range   Glucose 89 65 - 99 mg/dL   BUN 20 8 - 27 mg/dL   Creatinine, Ser 0.94 0.57 - 1.00 mg/dL   GFR calc non Af Amer 63 >59 mL/min/1.73   GFR calc Af Amer 73 >59 mL/min/1.73   BUN/Creatinine Ratio 21 12 - 28   Sodium 139 134 - 144 mmol/L   Potassium 4.1 3.5 - 5.2 mmol/L   Chloride 101 96 - 106 mmol/L   CO2 26 20 - 29 mmol/L   Calcium 9.5 8.7 - 10.3 mg/dL   Total Protein 6.2 6.0 - 8.5 g/dL   Albumin 4.2 3.8 - 4.8 g/dL   Globulin, Total 2.0 1.5 - 4.5 g/dL   Albumin/Globulin Ratio 2.1 1.2 - 2.2  Bilirubin Total 0.3 0.0 - 1.2 mg/dL   Alkaline Phosphatase 84 39 - 117 IU/L   AST 13 0 - 40 IU/L   ALT 17 0 - 32 IU/L  Lipid Panel w/o Chol/HDL Ratio  Result Value Ref Range   Cholesterol, Total 223 (H) 100 - 199 mg/dL   Triglycerides 251 (H) 0 - 149 mg/dL   HDL 66 >39 mg/dL   VLDL Cholesterol Cal 43 (H) 5 - 40 mg/dL   LDL Chol Calc (NIH) 114 (H) 0 - 99 mg/dL      Assessment & Plan:   Problem List Items Addressed This Visit      Genitourinary   Benign hypertensive renal disease    Under good control on current regimen. Continue current regimen. Continue to monitor. Call with any concerns. Refills given.  Labs drawn today.       Relevant Orders   CBC with Differential/Platelet   Comprehensive metabolic panel   Microalbumin, Urine Waived   TSH   UA/M w/rflx Culture, Routine     Other   Hyperlipidemia    Under good control on current regimen. Continue current regimen. Continue to monitor. Call with any concerns. Refills given. Labs drawn today.       Relevant Medications   lisinopril (ZESTRIL) 10 MG tablet   atorvastatin (LIPITOR) 20 MG tablet   Other Relevant Orders   CBC with Differential/Platelet   Comprehensive metabolic panel   Lipid Panel w/o Chol/HDL Ratio    Other Visit Diagnoses    Routine general medical examination at a health care facility    -  Primary   Vaccines up to date. Screening labs checked today. Mammogram up to date. Postpone colonscopy until things calm down. DEXA ordered. Call with any concerns.    History of vitamin D deficiency       Labs drawn today. Await results.    Relevant Orders   VITAMIN D 25 Hydroxy (Vit-D Deficiency, Fractures)   Senile purpura ([place])       Reassured patient. Call with any concerns.    Relevant Medications   lisinopril (ZESTRIL) 10 MG tablet   atorvastatin (LIPITOR) 20 MG tablet   Encounter for screening mammogram for malignant neoplasm of breast       Mammogram ordered. Due in December.    Relevant Orders   MM 3D SCREEN BREAST BILATERAL   Screening for osteoporosis       DEXA ordered today.   Relevant Orders   DG Bone Density       Follow up plan: Return in about 6 months (around 02/24/2020).   LABORATORY TESTING:  - Pap smear: not applicable  IMMUNIZATIONS:   - Tdap: Tetanus vaccination status reviewed: Post-poned due to COVID vaccine. - Influenza: Up to date - Pneumovax: Up to date - Prevnar: Up to date  SCREENING: -Mammogram: Up to date  - Colonoscopy: Declined  - Bone Density: Ordered today   PATIENT COUNSELING:   Advised to take 1 mg of folate supplement per day if capable of pregnancy.    Sexuality: Discussed sexually transmitted diseases, partner selection, use of condoms, avoidance of unintended pregnancy  and contraceptive alternatives.   Advised to avoid cigarette smoking.  I discussed with the patient that most people either abstain from alcohol or drink within safe limits (<=14/week and <=4 drinks/occasion for males, <=7/weeks and <= 3 drinks/occasion for females) and that the risk for alcohol disorders and other health effects rises proportionally with the number of drinks per week and  how often a drinker exceeds daily limits.  Discussed cessation/primary prevention of drug use and availability of treatment for abuse.   Diet: Encouraged to adjust caloric intake to maintain  or achieve ideal body weight, to reduce intake of dietary saturated fat and total fat, to limit sodium intake by avoiding high sodium foods and not adding table salt, and to maintain adequate dietary potassium and calcium preferably from fresh fruits, vegetables, and low-fat dairy products.    stressed the importance of regular exercise  Injury prevention: Discussed safety belts, safety helmets, smoke detector, smoking near bedding or upholstery.   Dental health: Discussed importance of regular tooth brushing, flossing, and dental visits.    NEXT PREVENTATIVE PHYSICAL DUE IN 1 YEAR. Return in about 6 months (around 02/24/2020).

## 2019-08-25 NOTE — Assessment & Plan Note (Signed)
Under good control on current regimen. Continue current regimen. Continue to monitor. Call with any concerns. Refills given. Labs drawn today.   

## 2019-08-25 NOTE — Progress Notes (Signed)
Subjective:   Makayla [name] is a 69 y.o. female who presents for Medicare Annual (Subsequent) preventive examination.  Review of Systems:   Cardiac Risk Factors include: advanced age (>12men, >60 women);hypertension;dyslipidemia     Objective:     Vitals: BP 129/84 (BP Location: Left Arm, Patient Position: Sitting, Cuff Size: Normal)   Pulse 62   Temp (!) 97.5 F (36.4 C) (Temporal)   Ht 5\' 6"  (1.676 m)   Wt 168 lb 3.2 oz (76.3 kg)   SpO2 98%   BMI 27.15 kg/m   Body mass index is 27.15 kg/m.  Advanced Directives 08/25/2019 07/03/2018  Does Patient Have a Medical Advance Directive? No No  Does patient want to make changes to medical advance directive? Yes (MAU/Ambulatory/Procedural Areas - Information given) -  Would patient like information on creating a medical advance directive? - No - Patient declined    Tobacco Social History   Tobacco Use  Smoking Status Former Smoker  . Quit date: 03/23/2002  . Years since quitting: 17.4  Smokeless Tobacco Never Used     Counseling given: Not Answered   Clinical Intake:  Pre-visit preparation completed: Yes  Pain : No/denies pain     Nutritional Status: BMI 25 -29 Overweight Nutritional Risks: None Diabetes: No  How often do you need to have someone help you when you read instructions, pamphlets, or other written materials from your doctor or pharmacy?: 1 - Never  Interpreter Needed?: No  Information entered by :: Tiffany Hill,LPN  Past Medical History:  Diagnosis Date  . DDD (degenerative disc disease), lumbar   . Hypertension   . Sciatica    Past Surgical History:  Procedure Laterality Date  . OVARIAN CYST REMOVAL Left   . TUBAL LIGATION     X 2   Family History  Problem Relation Age of Onset  . Breast cancer Sister 45  . Stroke Mother   . Cancer Brother        Melanoma- went to liver  . Melanoma Brother   . Liver cancer Brother   . Liver cancer Brother    Social History   Socioeconomic  History  . Marital status: Married    Spouse name: Not on file  . Number of children: Not on file  . Years of education: Not on file  . Highest education level: High school graduate  Occupational History  . Occupation: retired  Tobacco Use  . Smoking status: Former Smoker    Quit date: 03/23/2002    Years since quitting: 17.4  . Smokeless tobacco: Never Used  Substance and Sexual Activity  . Alcohol use: No  . Drug use: No  . Sexual activity: Not Currently  Other Topics Concern  . 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 Difficulty of Paying Living Expenses: Not hard at all  Food Insecurity: No Food Insecurity  . Worried About Charity fundraiser in the Last Year: Never true  . Ran Out of Food in the Last Year: Never true  Transportation Needs: No Transportation Needs  . Lack of Transportation (Medical): No  . Lack of Transportation (Non-Medical): No  Physical Activity: Inactive  . Days of Exercise per Week: 0 days  . Minutes of Exercise per Session: 0 min  Stress:   . Feeling of Stress :   Social Connections: Slightly Isolated  . Frequency  of Communication with Friends and Family: More than three times a week  . Frequency of Social Gatherings with Friends and Family: More than three times a week  . Attends Religious Services: More than 4 times per year  . Active Member of Clubs or Organizations: No  . Attends Archivist Meetings: Never  . Marital Status: Married    Outpatient Encounter Medications as of 08/25/2019  Medication Sig  . atorvastatin (LIPITOR) 20 MG tablet Take 1 tablet (20 mg total) by mouth daily.  Marland Kitchen ibuprofen (ADVIL,MOTRIN) 200 MG tablet Take 200 mg by mouth every 6 (six) hours as needed.  Marland Kitchen lisinopril (ZESTRIL) 10 MG tablet Take 1 tablet (10 mg total) by mouth daily.  . methocarbamol (ROBAXIN) 500 MG tablet Take 1 tablet (500 mg total) by mouth  every 8 (eight) hours as needed for muscle spasms.  . meloxicam (MOBIC) 15 MG tablet Take 15 mg by mouth daily.  . [DISCONTINUED] predniSONE (DELTASONE) 10 MG tablet 6 tabs today, 5 tabs tomorrow, decrease by 1 every day until gone. (Patient not taking: Reported on 08/25/2019)   No facility-administered encounter medications on file as of 08/25/2019.    Activities of Daily Living In your present state of health, do you have any difficulty performing the following activities: 08/25/2019  Hearing? N  Comment no hearing aids  Vision? N  Comment reading glasses, [facility]  Difficulty concentrating or making decisions? N  Walking or climbing stairs? Y  Dressing or bathing? N  Doing errands, shopping? N  Preparing Food and eating ? N  Using the Toilet? N  In the past six months, have you accidently leaked urine? N  Do you have problems with loss of bowel control? N  Managing your Medications? N  Managing your Finances? N  Housekeeping or managing your Housekeeping? N  Some recent data might be hidden    Patient Care Team: Valerie Roys, DO as PCP - General (Family Medicine) Dimmig, Marcello Moores, MD as Referring Physician (Orthopedic Surgery)    Assessment:   This is a routine wellness examination for Makayla [name].  Exercise Activities and Dietary recommendations Current Exercise Habits: Home exercise routine, Type of exercise: stretching, Time (Minutes): 15, Frequency (Times/Week): 7, Weekly Exercise (Minutes/Week): 105, Intensity: Mild, Exercise limited by: None identified  Goals Addressed   None     Fall Risk: Fall Risk  08/25/2019 04/21/2019 07/03/2018 06/19/2017 03/23/2017  Falls in the past year? 0 0 0 No No  Number falls in past yr: 0 0 0 - -  Injury with Fall? 0 0 0 - -    FALL RISK PREVENTION PERTAINING TO THE HOME:  Any stairs in or around the home? No  If so, are there any without handrails? Yes   Home free of loose throw rugs in walkways, pet beds, electrical cords, etc?  Yes  Adequate lighting in your home to reduce risk of falls? Yes   ASSISTIVE DEVICES UTILIZED TO PREVENT FALLS:  Life alert? No  Use of a cane, walker or w/c? No  Grab bars in the bathroom? Yes  Shower chair or bench in shower? No  Elevated toilet seat or a handicapped toilet? No   DME ORDERS:  DME order needed?  No   TIMED UP AND GO:  Was the test performed? Yes .  Length of time to ambulate 10 feet: 11 sec.   GAIT:  Appearance of gait: Gait steady and fast without the use of an assistive device.  Education: Fall risk  prevention has been discussed.  Intervention(s) required? No   DME/home health order needed?  No    Depression Screen PHQ 2/9 Scores 08/25/2019 07/03/2018 06/19/2017 03/23/2017  PHQ - 2 Score 0 0 0 0  PHQ- 9 Score - - 0 -     Cognitive Function     6CIT Screen 08/25/2019 07/03/2018 06/19/2017  What Year? 0 points 0 points 0 points  What month? 0 points 0 points 0 points  What time? 0 points 0 points 0 points  Count back from 20 0 points 0 points 0 points  Months in reverse 0 points 0 points 0 points  Repeat phrase 0 points 0 points 0 points  Total Score 0 0 0    Immunization History  Administered Date(s) Administered  . Influenza, High Dose Seasonal PF 03/15/2018  . Influenza,inj,Quad PF,6+ Mos 02/18/2019  . Influenza-Unspecified 03/05/2017  . Janssen (J&J) SARS-COV-2 Vaccination 07/25/2019  . Pneumococcal Conjugate-13 06/19/2017  . Pneumococcal Polysaccharide-23 05/15/2012, 07/03/2018  . Tetanus 10/29/2005  . Zoster 01/25/2015    Qualifies for Shingles Vaccine? Yes  Zostavax completed 2006. Due for Shingrix. Education has been provided regarding the importance of this vaccine. Pt has been advised to call insurance company to determine out of pocket expense. Advised may also receive vaccine at local pharmacy or Health Dept. Verbalized acceptance and understanding.  Tdap: Although this vaccine is not a covered service during a Wellness Exam, does the  patient still wish to receive this vaccine today?  No .  Education has been provided regarding the importance of this vaccine. Advised may receive this vaccine at local pharmacy or Health Dept. Aware to provide a copy of the vaccination record if obtained from local pharmacy or Health Dept. Verbalized acceptance and understanding.  Flu Vaccine: up to date   Pneumococcal Vaccine: up to date   Covid-19 Vaccine:  Completed vaccine  Screening Tests Health Maintenance  Topic Date Due  . DEXA SCAN  04/14/2020 (Originally 05/26/2017)  . COLONOSCOPY  08/24/2020 (Originally 03/04/2019)  . TETANUS/TDAP  08/24/2020 (Originally 10/30/2015)  . INFLUENZA VACCINE  12/14/2019  . MAMMOGRAM  05/06/2021  . Hepatitis C Screening  Completed  . PNA vac Low Risk Adult  Completed    Cancer Screenings:  Colorectal Screening: Completed 03/03/2014. Repeat every 10 years  Mammogram: Completed 04/2019. Repeat every year  Bone Density: due now, will be done with next mammogram  Lung Cancer Screening: (Low Dose CT Chest recommended if Age 12-80 years, 30 pack-year currently smoking OR have quit w/in 15years.) does not qualify.     Additional Screening:  Hepatitis C Screening: does qualify; Completed 2019  Vision Screening: Recommended annual ophthalmology exams for early detection of glaucoma and other disorders of the eye. Is the patient up to date with their annual eye exam?  Yes  Who is the provider or what is the name of the office in which the pt attends annual eye exams? [facility]    Dental Screening: Recommended annual dental exams for proper oral hygiene  Community Resource Referral:  CRR required this visit?  No       Plan:  I have personally reviewed and addressed the Medicare Annual Wellness questionnaire and have noted the following in the patient's chart:  A. Medical and social history B. Use of alcohol, tobacco or illicit drugs  C. Current medications and supplements D.  Functional ability and status E.  Nutritional status F.  Physical activity G. Advance directives H. List of other physicians I.  Hospitalizations, surgeries, and ER visits in previous 12 months J.  [place] such as hearing and vision if needed, cognitive and depression L. Referrals and appointments   In addition, I have reviewed and discussed with patient certain preventive protocols, quality metrics, and best practice recommendations. A written personalized care plan for preventive services as well as general preventive health recommendations were provided to patient.  Signed,    Bevelyn Ngo, LPN  [id number] Nurse Health Advisor   Nurse Notes: Patient states she has some ridges in her finger nails, present for about a year. Requesting if she can take anything to help improve them.

## 2019-08-25 NOTE — Patient Instructions (Signed)
Makayla [name] , Thank you for taking time to come for your Medicare Wellness Visit. I appreciate your ongoing commitment to your health goals. Please review the following plan we discussed and let me know if I can assist you in the future.   Screening recommendations/referrals: Colonoscopy: completed 03/03/2014, due now - declined.  Mammogram: completed 05/07/2019 Bone Density: due now, can be done with next mammogram.  Recommended yearly ophthalmology/optometry visit for glaucoma screening and checkup Recommended yearly dental visit for hygiene and checkup  Vaccinations: Influenza vaccine: up to date Pneumococcal vaccine: up to date  Tdap vaccine: due now  Shingles vaccine: shingrix eligible    Covid-19: completed   Advanced directives: Advance directive discussed with you today. I have provided a copy for you to complete at home and have notarized. Once this is complete please bring a copy in to our office so we can scan it into your chart.  Conditions/risks identified: none   Next appointment: Follow up in one year for your annual wellness visit    Preventive Care 65 Years and Older, Female Preventive care refers to lifestyle choices and visits with your health care provider that can promote health and wellness. What does preventive care include?  A yearly physical exam. This is also called an annual well check.  Dental exams once or twice a year.  Routine eye exams. Ask your health care provider how often you should have your eyes checked.  Personal lifestyle choices, including:  Daily care of your teeth and gums.  Regular physical activity.  Eating a healthy diet.  Avoiding tobacco and drug use.  Limiting alcohol use.  Practicing safe sex.  Taking low-dose aspirin every day.  Taking vitamin and mineral supplements as recommended by your health care provider. What happens during an annual well check? The services and screenings done by your health care  provider during your annual well check will depend on your age, overall health, lifestyle risk factors, and family history of disease. Counseling  Your health care provider may ask you questions about your:  Alcohol use.  Tobacco use.  Drug use.  Emotional well-being.  Home and relationship well-being.  Sexual activity.  Eating habits.  History of falls.  Memory and ability to understand (cognition).  Work and work Statistician.  Reproductive health. Screening  You may have the following tests or measurements:  Height, weight, and BMI.  Blood pressure.  Lipid and cholesterol levels. These may be checked every 5 years, or more frequently if you are over 63 years old.  Skin check.  Lung cancer screening. You may have this screening every year starting at age 36 if you have a 30-pack-year history of smoking and currently smoke or have quit within the past 15 years.  Fecal occult blood test (FOBT) of the stool. You may have this test every year starting at age 55.  Flexible sigmoidoscopy or colonoscopy. You may have a sigmoidoscopy every 5 years or a colonoscopy every 10 years starting at age 84.  Hepatitis C blood test.  Hepatitis B blood test.  Sexually transmitted disease (STD) testing.  Diabetes screening. This is done by checking your blood sugar (glucose) after you have not eaten for a while (fasting). You may have this done every 1-3 years.  Bone density scan. This is done to screen for osteoporosis. You may have this done starting at age 26.  Mammogram. This may be done every 1-2 years. Talk to your health care provider about how often you should  have regular mammograms. Talk with your health care provider about your test results, treatment options, and if necessary, the need for more tests. Vaccines  Your health care provider may recommend certain vaccines, such as:  Influenza vaccine. This is recommended every year.  Tetanus, diphtheria, and acellular  pertussis (Tdap, Td) vaccine. You may need a Td booster every 10 years.  Zoster vaccine. You may need this after age 53.  Pneumococcal 13-valent conjugate (PCV13) vaccine. One dose is recommended after age 53.  Pneumococcal polysaccharide (PPSV23) vaccine. One dose is recommended after age 89. Talk to your health care provider about which screenings and vaccines you need and how often you need them. This information is not intended to replace advice given to you by your health care provider. Make sure you discuss any questions you have with your health care provider. Document Released: 05/28/2015 Document Revised: 01/19/2016 Document Reviewed: 03/02/2015 Elsevier Interactive Patient Education  2017 [place] Prevention in the Home Falls can cause injuries. They can happen to people of all ages. There are many things you can do to make your home safe and to help prevent falls. What can I do on the outside of my home?  Regularly fix the edges of walkways and driveways and fix any cracks.  Remove anything that might make you trip as you walk through a door, such as a raised step or threshold.  Trim any bushes or trees on the path to your home.  Use bright outdoor lighting.  Clear any walking paths of anything that might make someone trip, such as rocks or tools.  Regularly check to see if handrails are loose or broken. Make sure that both sides of any steps have handrails.  Any raised decks and porches should have guardrails on the edges.  Have any leaves, snow, or ice cleared regularly.  Use sand or salt on walking paths during winter.  Clean up any spills in your garage right away. This includes oil or grease spills. What can I do in the bathroom?  Use night lights.  Install grab bars by the toilet and in the tub and shower. Do not use towel bars as grab bars.  Use non-skid mats or decals in the tub or shower.  If you need to sit down in the shower, use a plastic,  non-slip stool.  Keep the floor dry. Clean up any water that spills on the floor as soon as it happens.  Remove soap buildup in the tub or shower regularly.  Attach bath mats securely with double-sided non-slip rug tape.  Do not have throw rugs and other things on the floor that can make you trip. What can I do in the bedroom?  Use night lights.  Make sure that you have a light by your bed that is easy to reach.  Do not use any sheets or blankets that are too big for your bed. They should not hang down onto the floor.  Have a firm chair that has side arms. You can use this for support while you get dressed.  Do not have throw rugs and other things on the floor that can make you trip. What can I do in the kitchen?  Clean up any spills right away.  Avoid walking on wet floors.  Keep items that you use a lot in easy-to-reach places.  If you need to reach something above you, use a strong step stool that has a grab bar.  Keep electrical cords out of  the way.  Do not use floor polish or wax that makes floors slippery. If you must use wax, use non-skid floor wax.  Do not have throw rugs and other things on the floor that can make you trip. What can I do with my stairs?  Do not leave any items on the stairs.  Make sure that there are handrails on both sides of the stairs and use them. Fix handrails that are broken or loose. Make sure that handrails are as long as the stairways.  Check any carpeting to make sure that it is firmly attached to the stairs. Fix any carpet that is loose or worn.  Avoid having throw rugs at the top or bottom of the stairs. If you do have throw rugs, attach them to the floor with carpet tape.  Make sure that you have a light switch at the top of the stairs and the bottom of the stairs. If you do not have them, ask someone to add them for you. What else can I do to help prevent falls?  Wear shoes that:  Do not have high heels.  Have rubber  bottoms.  Are comfortable and fit you well.  Are closed at the toe. Do not wear sandals.  If you use a stepladder:  Make sure that it is fully opened. Do not climb a closed stepladder.  Make sure that both sides of the stepladder are locked into place.  Ask someone to hold it for you, if possible.  Clearly mark and make sure that you can see:  Any grab bars or handrails.  First and last steps.  Where the edge of each step is.  Use tools that help you move around (mobility aids) if they are needed. These include:  Canes.  Walkers.  Scooters.  Crutches.  Turn on the lights when you go into a dark area. Replace any light bulbs as soon as they burn out.  Set up your furniture so you have a clear path. Avoid moving your furniture around.  If any of your floors are uneven, fix them.  If there are any pets around you, be aware of where they are.  Review your medicines with your doctor. Some medicines can make you feel dizzy. This can increase your chance of falling. Ask your doctor what other things that you can do to help prevent falls. This information is not intended to replace advice given to you by your health care provider. Make sure you discuss any questions you have with your health care provider. Document Released: 02/25/2009 Document Revised: 10/07/2015 Document Reviewed: 06/05/2014 Elsevier Interactive Patient Education  2017 [employer].

## 2019-08-25 NOTE — Patient Instructions (Addendum)
You can do both your mammogram and your bone density in December. Call to schedule:  [facility]  Address: [address], [place], [place] [address]  Phone: [phone]   Health Maintenance After Age 67 After age 59, you are at a higher risk for certain long-term diseases and infections as well as injuries from falls. Falls are a major cause of broken bones and head injuries in people who are older than age 51. Getting regular preventive care can help to keep you healthy and well. Preventive care includes getting regular testing and making lifestyle changes as recommended by your health care provider. Talk with your health care provider about:  Which screenings and tests you should have. A screening is a test that checks for a disease when you have no symptoms.  A diet and exercise plan that is right for you. What should I know about screenings and tests to prevent falls? Screening and testing are the best ways to find a health problem early. Early diagnosis and treatment give you the best chance of managing medical conditions that are common after age 26. Certain conditions and lifestyle choices may make you more likely to have a fall. Your health care provider may recommend:  Regular vision checks. Poor vision and conditions such as cataracts can make you more likely to have a fall. If you wear glasses, make sure to get your prescription updated if your vision changes.  Medicine review. Work with your health care provider to regularly review all of the medicines you are taking, including over-the-counter medicines. Ask your health care provider about any side effects that may make you more likely to have a fall. Tell your health care provider if any medicines that you take make you feel dizzy or sleepy.  Osteoporosis screening. Osteoporosis is a condition that causes the bones to get weaker. This can make the bones weak and cause them to break more  easily.  Blood pressure screening. Blood pressure changes and medicines to control blood pressure can make you feel dizzy.  Strength and balance checks. Your health care provider may recommend certain tests to check your strength and balance while standing, walking, or changing positions.  Foot health exam. Foot pain and numbness, as well as not wearing proper footwear, can make you more likely to have a fall.  Depression screening. You may be more likely to have a fall if you have a fear of falling, feel emotionally low, or feel unable to do activities that you used to do.  Alcohol use screening. Using too much alcohol can affect your balance and may make you more likely to have a fall. What actions can I take to lower my risk of falls? General instructions  Talk with your health care provider about your risks for falling. Tell your health care provider if: ? You fall. Be sure to tell your health care provider about all falls, even ones that seem minor. ? You feel dizzy, sleepy, or off-balance.  Take over-the-counter and prescription medicines only as told by your health care provider. These include any supplements.  Eat a healthy diet and maintain a healthy weight. A healthy diet includes low-fat dairy products, low-fat (lean) meats, and fiber from whole grains, beans, and lots of fruits and vegetables. Home safety  Remove any tripping hazards, such as rugs, cords, and clutter.  Install safety equipment such as grab bars in bathrooms and safety rails on stairs.  Keep rooms and walkways well-lit.  Activity   Follow a regular exercise program to stay fit. This will help you maintain your balance. Ask your health care provider what types of exercise are appropriate for you.  If you need a cane or walker, use it as recommended by your health care provider.  Wear supportive shoes that have nonskid soles. Lifestyle  Do not drink alcohol if your health care provider tells you not to  drink.  If you drink alcohol, limit how much you have: ? 0-1 drink a day for women. ? 0-2 drinks a day for men.  Be aware of how much alcohol is in your drink. In the [place] one drink equals one typical bottle of beer (12 oz), one-half glass of wine (5 oz), or one shot of hard liquor (1 oz).  Do not use any products that contain nicotine or tobacco, such as cigarettes and e-cigarettes. If you need help quitting, ask your health care provider. Summary  Having a healthy lifestyle and getting preventive care can help to protect your health and wellness after age 58.  Screening and testing are the best way to find a health problem early and help you avoid having a fall. Early diagnosis and treatment give you the best chance for managing medical conditions that are more common for people who are older than age 40.  Falls are a major cause of broken bones and head injuries in people who are older than age 39. Take precautions to prevent a fall at home.  Work with your health care provider to learn what changes you can make to improve your health and wellness and to prevent falls. This information is not intended to replace advice given to you by your health care provider. Make sure you discuss any questions you have with your health care provider. Document Revised: 08/22/2018 Document Reviewed: 03/14/2017 Elsevier Patient Education  2020 [employer].

## 2019-08-26 LAB — COMPREHENSIVE METABOLIC PANEL
ALT: 16 IU/L (ref 0–32)
AST: 17 IU/L (ref 0–40)
Albumin/Globulin Ratio: 2.4 — ABNORMAL HIGH (ref 1.2–2.2)
Albumin: 4.3 g/dL (ref 3.8–4.8)
Alkaline Phosphatase: 83 IU/L (ref 39–117)
BUN/Creatinine Ratio: 28 (ref 12–28)
BUN: 24 mg/dL (ref 8–27)
Bilirubin Total: 0.3 mg/dL (ref 0.0–1.2)
CO2: 23 mmol/L (ref 20–29)
Calcium: 10.1 mg/dL (ref 8.7–10.3)
Chloride: 104 mmol/L (ref 96–106)
Creatinine, Ser: 0.87 mg/dL (ref 0.57–1.00)
GFR calc Af Amer: 80 mL/min/{1.73_m2} (ref >59)
GFR calc non Af Amer: 69 mL/min/{1.73_m2} (ref >59)
Globulin, Total: 1.8 g/dL (ref 1.5–4.5)
Glucose: 89 mg/dL (ref 65–99)
Potassium: 4.2 mmol/L (ref 3.5–5.2)
Sodium: 141 mmol/L (ref 134–144)
Total Protein: 6.1 g/dL (ref 6.0–8.5)

## 2019-08-26 LAB — CBC WITH DIFFERENTIAL/PLATELET
Basophils Absolute: 0.1 10*3/uL (ref 0.0–0.2)
Basos: 1 %
EOS (ABSOLUTE): 0.1 10*3/uL (ref 0.0–0.4)
Eos: 1 %
Hematocrit: 37.5 % (ref 34.0–46.6)
Hemoglobin: 12.3 g/dL (ref 11.1–15.9)
Immature Grans (Abs): 0 10*3/uL (ref 0.0–0.1)
Immature Granulocytes: 0 %
Lymphocytes Absolute: 2.4 10*3/uL (ref 0.7–3.1)
Lymphs: 34 %
MCH: 32.3 pg (ref 26.6–33.0)
MCHC: 32.8 g/dL (ref 31.5–35.7)
MCV: 98 fL — ABNORMAL HIGH (ref 79–97)
Monocytes Absolute: 0.6 10*3/uL (ref 0.1–0.9)
Monocytes: 8 %
Neutrophils Absolute: 4 10*3/uL (ref 1.4–7.0)
Neutrophils: 56 %
Platelets: 221 10*3/uL (ref 150–450)
RBC: 3.81 x10E6/uL (ref 3.77–5.28)
RDW: 12.4 % (ref 11.7–15.4)
WBC: 7.1 10*3/uL (ref 3.4–10.8)

## 2019-08-26 LAB — LIPID PANEL W/O CHOL/HDL RATIO
Cholesterol, Total: 183 mg/dL (ref 100–199)
HDL: 68 mg/dL (ref >39)
LDL Chol Calc (NIH): 88 mg/dL (ref 0–99)
Triglycerides: 162 mg/dL — ABNORMAL HIGH (ref 0–149)
VLDL Cholesterol Cal: 27 mg/dL (ref 5–40)

## 2019-08-26 LAB — VITAMIN D 25 HYDROXY (VIT D DEFICIENCY, FRACTURES): Vit D, 25-Hydroxy: 25.9 ng/mL — ABNORMAL LOW (ref 30.0–100.0)

## 2019-08-26 LAB — TSH: TSH: 2.41 u[IU]/mL (ref 0.450–4.500)

## 2019-09-03 DIAGNOSIS — M7061 Trochanteric bursitis, right hip: Secondary | ICD-10-CM | POA: Diagnosis not present

## 2019-11-03 DIAGNOSIS — M533 Sacrococcygeal disorders, not elsewhere classified: Secondary | ICD-10-CM | POA: Diagnosis not present

## 2020-02-01 DIAGNOSIS — L03114 Cellulitis of left upper limb: Secondary | ICD-10-CM | POA: Diagnosis not present

## 2020-03-11 ENCOUNTER — Other Ambulatory Visit: Payer: Self-pay | Admitting: Family Medicine

## 2020-03-17 DIAGNOSIS — H43811 Vitreous degeneration, right eye: Secondary | ICD-10-CM | POA: Diagnosis not present

## 2020-04-15 ENCOUNTER — Other Ambulatory Visit: Payer: Self-pay | Admitting: Family Medicine

## 2020-04-15 DIAGNOSIS — H43811 Vitreous degeneration, right eye: Secondary | ICD-10-CM | POA: Diagnosis not present

## 2020-05-10 ENCOUNTER — Other Ambulatory Visit: Payer: Self-pay

## 2020-05-10 ENCOUNTER — Ambulatory Visit
Admission: RE | Admit: 2020-05-10 | Discharge: 2020-05-10 | Disposition: A | Discharge status: Home / Self Care | Payer: PPO | Source: Ambulatory Visit | Attending: Family Medicine | Admitting: Family Medicine

## 2020-05-10 DIAGNOSIS — Z780 Asymptomatic menopausal state: Secondary | ICD-10-CM | POA: Insufficient documentation

## 2020-05-10 DIAGNOSIS — Z13820 Encounter for screening for osteoporosis: Secondary | ICD-10-CM | POA: Insufficient documentation

## 2020-05-10 DIAGNOSIS — Z1231 Encounter for screening mammogram for malignant neoplasm of breast: Secondary | ICD-10-CM | POA: Insufficient documentation

## 2020-05-10 DIAGNOSIS — M85851 Other specified disorders of bone density and structure, right thigh: Secondary | ICD-10-CM | POA: Insufficient documentation

## 2020-05-10 DIAGNOSIS — Z87828 Personal history of other (healed) physical injury and trauma: Secondary | ICD-10-CM | POA: Diagnosis not present

## 2020-05-26 ENCOUNTER — Encounter: Payer: Self-pay | Admitting: Family Medicine

## 2020-05-28 MED ORDER — OMEPRAZOLE 20 MG PO CPDR: 20.0000 mg | DELAYED_RELEASE_CAPSULE | Freq: Every day | ORAL | 2 refills | Status: DC

## 2020-07-20 DIAGNOSIS — M1712 Unilateral primary osteoarthritis, left knee: Secondary | ICD-10-CM | POA: Diagnosis not present

## 2020-08-12 ENCOUNTER — Other Ambulatory Visit: Payer: Self-pay | Admitting: Family Medicine

## 2020-08-12 NOTE — Telephone Encounter (Signed)
Requested Prescriptions  Pending Prescriptions Disp Refills  . omeprazole (PRILOSEC) 20 MG capsule [Pharmacy Med Name: OMEPRAZOLE DR 20 MG CAPSULE] 30 capsule 2    Sig: TAKE 1 CAPSULE BY MOUTH EVERY DAY     Gastroenterology: Proton Pump Inhibitors Passed - 08/12/2020  9:07 AM      Passed - Valid encounter within last 12 months    Recent Outpatient Visits          11 months ago Routine general medical examination at a health care facility   [facility], [place], [place]   1 year ago Chronic right-sided low back pain with right-sided sciatica   [place], Megan P, DO   1 year ago Benign hypertensive renal disease   [facility] [place], Megan P, DO   1 year ago Acute conjunctivitis of right eye, unspecified acute conjunctivitis type   [facility] [place], [place], DO   2 years ago Mixed hyperlipidemia   [facility] [place], [place], DO      Future Appointments            In 2 weeks Wynetta Emery, Barb Merino, DO [employer]

## 2020-08-23 ENCOUNTER — Ambulatory Visit: Payer: PPO | Admitting: Family Medicine

## 2020-08-27 ENCOUNTER — Encounter: Payer: PPO | Admitting: Family Medicine

## 2020-08-31 DIAGNOSIS — M1712 Unilateral primary osteoarthritis, left knee: Secondary | ICD-10-CM | POA: Diagnosis not present

## 2020-08-31 DIAGNOSIS — M25562 Pain in left knee: Secondary | ICD-10-CM | POA: Diagnosis not present

## 2020-09-01 ENCOUNTER — Ambulatory Visit (INDEPENDENT_AMBULATORY_CARE_PROVIDER_SITE_OTHER): Payer: PPO | Admitting: Family Medicine

## 2020-09-01 ENCOUNTER — Other Ambulatory Visit: Payer: Self-pay

## 2020-09-01 ENCOUNTER — Encounter: Payer: Self-pay | Admitting: Family Medicine

## 2020-09-01 VITALS — BP 130/84 | HR 63 | Temp 98.2°F | Ht 65.0 in | Wt 163.0 lb

## 2020-09-01 DIAGNOSIS — E782 Mixed hyperlipidemia: Secondary | ICD-10-CM

## 2020-09-01 DIAGNOSIS — S51802A Unspecified open wound of left forearm, initial encounter: Secondary | ICD-10-CM | POA: Diagnosis not present

## 2020-09-01 DIAGNOSIS — I129 Hypertensive chronic kidney disease with stage 1 through stage 4 chronic kidney disease, or unspecified chronic kidney disease: Secondary | ICD-10-CM

## 2020-09-01 DIAGNOSIS — Z0000 Encounter for general adult medical examination without abnormal findings: Secondary | ICD-10-CM | POA: Diagnosis not present

## 2020-09-01 DIAGNOSIS — Z8639 Personal history of other endocrine, nutritional and metabolic disease: Secondary | ICD-10-CM | POA: Diagnosis not present

## 2020-09-01 DIAGNOSIS — D692 Other nonthrombocytopenic purpura: Secondary | ICD-10-CM

## 2020-09-01 DIAGNOSIS — Z23 Encounter for immunization: Secondary | ICD-10-CM | POA: Diagnosis not present

## 2020-09-01 LAB — URINALYSIS, ROUTINE W REFLEX MICROSCOPIC
Bilirubin, UA: NEGATIVE
Glucose, UA: NEGATIVE
Ketones, UA: NEGATIVE
Leukocytes,UA: NEGATIVE
Nitrite, UA: NEGATIVE
Protein,UA: NEGATIVE
Specific Gravity, UA: <1.005 — ABNORMAL LOW (ref 1.005–1.030)
Urobilinogen, Ur: 0.2 mg/dL (ref 0.2–1.0)
pH, UA: 5.5 (ref 5.0–7.5)

## 2020-09-01 LAB — MICROSCOPIC EXAMINATION
Bacteria, UA: NONE SEEN
WBC, UA: NONE SEEN /hpf (ref 0–5)

## 2020-09-01 LAB — MICROALBUMIN, URINE WAIVED
Creatinine, Urine Waived: 50 mg/dL (ref 10–300)
Microalb, Ur Waived: 10 mg/L (ref 0–19)
Microalb/Creat Ratio: <30 mg/g (ref <30)

## 2020-09-01 MED ORDER — LISINOPRIL 10 MG PO TABS: 1.0000 | ORAL_TABLET | Freq: Every day | ORAL | 1 refills | Status: DC

## 2020-09-01 MED ORDER — ATORVASTATIN CALCIUM 20 MG PO TABS: 1.0000 | ORAL_TABLET | Freq: Every day | ORAL | 1 refills | Status: DC

## 2020-09-01 MED ORDER — OMEPRAZOLE 20 MG PO CPDR: DELAYED_RELEASE_CAPSULE | ORAL | 1 refills | Status: DC

## 2020-09-01 NOTE — Assessment & Plan Note (Signed)
Under good control on current regimen. Continue current regimen. Continue to monitor. Call with any concerns. Refills given. Labs drawn today.   

## 2020-09-01 NOTE — Progress Notes (Signed)
BP 130/84   Pulse 63   Temp 98.2 F (36.8 C)   Ht 5\' 5"  (1.651 m)   Wt 163 lb (73.9 kg)   SpO2 98%   BMI 27.12 kg/m    Subjective:    Patient ID: Makayla [name], female    DOB: 07-28-1950, 70 y.o.   MRN: [id number]  HPI: Makayla [name] is a 70 y.o. female presenting on 09/01/2020 for comprehensive medical examination. Current medical complaints include:  HYPERTENSION / HYPERLIPIDEMIA Satisfied with current treatment? yes Duration of hypertension: chronic BP monitoring frequency: not checking BP medication side effects: no Past BP meds: lisinopril Duration of hyperlipidemia: chronic Cholesterol medication side effects: no Cholesterol supplements: none Past cholesterol medications: atorvastatin Medication compliance: excellent compliance Aspirin: no Recent stressors: no Recurrent headaches: no Visual changes: no Palpitations: no Dyspnea: no Chest pain: no Lower extremity edema: no Dizzy/lightheaded: no  Menopausal Symptoms: no  Depression Screen done today and results listed below:  Depression screen [facility] 2/9 09/01/2020 08/25/2019 07/03/2018 06/19/2017 03/23/2017  Decreased Interest 0 0 0 0 0  Down, Depressed, Hopeless 0 0 0 0 0  PHQ - 2 Score 0 0 0 0 0  Altered sleeping - - - 0 -  Tired, decreased energy - - - 0 -  Change in appetite - - - 0 -  Feeling bad or failure about yourself  - - - 0 -  Trouble concentrating - - - 0 -  Moving slowly or fidgety/restless - - - 0 -  Suicidal thoughts - - - 0 -  PHQ-9 Score - - - 0 -    Past Medical History:  Past Medical History:  Diagnosis Date  . DDD (degenerative disc disease), lumbar   . Hypertension   . Sciatica     Surgical History:  Past Surgical History:  Procedure Laterality Date  . OVARIAN CYST REMOVAL Left   . TUBAL LIGATION     X 2    Medications:  Current Outpatient Medications on File Prior to Visit  Medication Sig  . ibuprofen (ADVIL,MOTRIN) 200 MG tablet Take 200 mg by mouth every 6 (six)  hours as needed.  . meloxicam (MOBIC) 15 MG tablet Take 15 mg by mouth daily. (Patient not taking: Reported on 09/01/2020)  . methocarbamol (ROBAXIN) 500 MG tablet Take 1 tablet (500 mg total) by mouth every 8 (eight) hours as needed for muscle spasms. (Patient not taking: Reported on 09/01/2020)   No current facility-administered medications on file prior to visit.    Allergies:  Allergies  Allergen Reactions  . Crestor [Rosuvastatin Calcium] Other (See Comments)    Headache    Social History:  Social History   Socioeconomic History  . Marital status: Married    Spouse name: Not on file  . Number of children: Not on file  . Years of education: Not on file  . Highest education level: High school graduate  Occupational History  . Occupation: retired  Tobacco Use  . Smoking status: Former Smoker    Quit date: 03/23/2002    Years since quitting: 18.4  . Smokeless tobacco: Never Used  Vaping Use  . Vaping Use: Never used  Substance and Sexual Activity  . Alcohol use: No  . Drug use: No  . Sexual activity: Not Currently  Other Topics Concern  . 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 Quit date: 03/23/2002  . Years since quitting: 18.4  Smokeless Tobacco Never Used   Social History   Substance and Sexual Activity  Alcohol Use No    Family History:  Family History  Problem Relation Age of Onset  . Breast cancer Sister 50  . Stroke Mother   . Cancer Brother        Melanoma- went to liver  . Melanoma Brother   . Liver cancer Brother   . Liver cancer Brother     Past medical history, surgical history,  medications, allergies, family history and social history reviewed with patient today and changes made to appropriate areas of the chart.   Review of Systems  Constitutional: Negative.   HENT: Negative.   Eyes: Negative.   Respiratory: Negative.   Cardiovascular: Negative.   Gastrointestinal: Positive for heartburn (with food choices). Negative for abdominal pain, blood in stool, constipation, diarrhea, melena, nausea and vomiting.  Genitourinary: Negative.   Musculoskeletal: Negative.   Skin: Negative.   Neurological: Negative.   Endo/Heme/Allergies: Negative for environmental allergies and polydipsia. Bruises/bleeds easily.  Psychiatric/Behavioral: Negative.    All other ROS negative except what is listed above and in the HPI.      Objective:    BP 130/84   Pulse 63   Temp 98.2 F (36.8 C)   Ht 5\' 5"  (1.651 m)   Wt 163 lb (73.9 kg)   SpO2 98%   BMI 27.12 kg/m   Wt Readings from Last 3 Encounters:  09/01/20 163 lb (73.9 kg)  08/25/19 168 lb 3.2 oz (76.3 kg)  08/25/19 168 lb 3.2 oz (76.3 kg)    Physical Exam Vitals and nursing note reviewed.  Constitutional:      General: She is not in acute distress.    Appearance: Normal appearance. She is not ill-appearing, toxic-appearing or diaphoretic.  HENT:     Head: Normocephalic and atraumatic.     Right Ear: Tympanic membrane, ear canal and external ear normal. There is no impacted cerumen.     Left Ear: Tympanic membrane, ear canal and external ear normal. There is no impacted cerumen.     Nose: Nose normal. No congestion or rhinorrhea.     Mouth/Throat:     Mouth: Mucous membranes are moist.     Pharynx: Oropharynx is clear. No oropharyngeal exudate or posterior oropharyngeal erythema.  Eyes:     General: No scleral icterus.       Right eye: No discharge.        Left eye: No discharge.     Extraocular Movements: Extraocular movements intact.     Conjunctiva/sclera: Conjunctivae normal.     Pupils: Pupils are equal,  round, and reactive to light.  Neck:     Vascular: No carotid bruit.  Cardiovascular:     Rate and Rhythm: Normal rate and regular rhythm.     Pulses: Normal pulses.     Heart sounds: No murmur heard. No friction rub. No gallop.   Pulmonary:     Effort: Pulmonary effort is normal. No respiratory distress.     Breath sounds: Normal breath sounds. No stridor. No wheezing, rhonchi or rales.  Chest:     Chest wall:  No tenderness.  Abdominal:     General: Abdomen is flat. Bowel sounds are normal. There is no distension.     Palpations: Abdomen is soft. There is no mass.     Tenderness: There is no abdominal tenderness. There is no right CVA tenderness, left CVA tenderness, guarding or rebound.     Hernia: No hernia is present.  Genitourinary:    Comments: Breast and pelvic exams deferred with shared decision making Musculoskeletal:        General: No swelling, tenderness, deformity or signs of injury. Normal range of motion.     Cervical back: Normal range of motion and neck supple. No rigidity. No muscular tenderness.     Right lower leg: No edema.     Left lower leg: No edema.  Lymphadenopathy:     Cervical: No cervical adenopathy.  Skin:    General: Skin is warm and dry.     Capillary Refill: Capillary refill takes less than 2 seconds.     Coloration: Skin is not jaundiced or pale.     Findings: No bruising, erythema, lesion or rash.     Comments: Well healing small wound on L arm  Neurological:     General: No focal deficit present.     Mental Status: She is alert and oriented to person, place, and time. Mental status is at baseline.     Cranial Nerves: No cranial nerve deficit.     Sensory: No sensory deficit.     Motor: No weakness.     Coordination: Coordination normal.     Gait: Gait normal.     Deep Tendon Reflexes: Reflexes normal.  Psychiatric:        Mood and Affect: Mood normal.        Behavior: Behavior normal.        Thought Content: Thought content normal.         Judgment: Judgment normal.     Results for orders placed or performed in visit on 08/25/19  Microscopic Examination   URINE  Result Value Ref Range   WBC, UA 0-5 0 - 5 /hpf   RBC 3-10 (A) 0 - 2 /hpf   Epithelial Cells (non renal) 0-10 0 - 10 /hpf   Casts Present None seen /lpf   Cast Type Hyaline casts N/A   Mucus, UA Present Not Estab.   Bacteria, UA None seen None seen/Few  CBC with Differential/Platelet  Result Value Ref Range   WBC 7.1 3.4 - 10.8 x10E3/uL   RBC 3.81 3.77 - 5.28 x10E6/uL   Hemoglobin 12.3 11.1 - 15.9 g/dL   Hematocrit 37.5 34.0 - 46.6 %   MCV 98 (H) 79 - 97 fL   MCH 32.3 26.6 - 33.0 pg   MCHC 32.8 31.5 - 35.7 g/dL   RDW 12.4 11.7 - 15.4 %   Platelets 221 150 - 450 x10E3/uL   Neutrophils 56 Not Estab. %   Lymphs 34 Not Estab. %   Monocytes 8 Not Estab. %   Eos 1 Not Estab. %   Basos 1 Not Estab. %   Neutrophils Absolute 4.0 1.4 - 7.0 x10E3/uL   Lymphocytes Absolute 2.4 0.7 - 3.1 x10E3/uL   Monocytes Absolute 0.6 0.1 - 0.9 x10E3/uL   EOS (ABSOLUTE) 0.1 0.0 - 0.4 x10E3/uL   Basophils Absolute 0.1 0.0 - 0.2 x10E3/uL   Immature Granulocytes 0 Not Estab. %   Immature Grans (Abs) 0.0 0.0 - 0.1 x10E3/uL  Comprehensive metabolic panel  Result Value  Ref Range   Glucose 89 65 - 99 mg/dL   BUN 24 8 - 27 mg/dL   Creatinine, Ser 0.87 0.57 - 1.00 mg/dL   GFR calc non Af Amer 69 >59 mL/min/1.73   GFR calc Af Amer 80 >59 mL/min/1.73   BUN/Creatinine Ratio 28 12 - 28   Sodium 141 134 - 144 mmol/L   Potassium 4.2 3.5 - 5.2 mmol/L   Chloride 104 96 - 106 mmol/L   CO2 23 20 - 29 mmol/L   Calcium 10.1 8.7 - 10.3 mg/dL   Total Protein 6.1 6.0 - 8.5 g/dL   Albumin 4.3 3.8 - 4.8 g/dL   Globulin, Total 1.8 1.5 - 4.5 g/dL   Albumin/Globulin Ratio 2.4 (H) 1.2 - 2.2   Bilirubin Total 0.3 0.0 - 1.2 mg/dL   Alkaline Phosphatase 83 39 - 117 IU/L   AST 17 0 - 40 IU/L   ALT 16 0 - 32 IU/L  Lipid Panel w/o Chol/HDL Ratio  Result Value Ref Range   Cholesterol, Total  183 100 - 199 mg/dL   Triglycerides 162 (H) 0 - 149 mg/dL   HDL 68 >39 mg/dL   VLDL Cholesterol Cal 27 5 - 40 mg/dL   LDL Chol Calc (NIH) 88 0 - 99 mg/dL  Microalbumin, Urine Waived  Result Value Ref Range   Microalb, Ur Waived 30 (H) 0 - 19 mg/L   Creatinine, Urine Waived 200 10 - 300 mg/dL   Microalb/Creat Ratio <30 <30 mg/g  TSH  Result Value Ref Range   TSH 2.410 0.450 - 4.500 uIU/mL  UA/M w/rflx Culture, Routine   Specimen: Urine   URINE  Result Value Ref Range   Specific Gravity, UA 1.025 1.005 - 1.030   pH, UA 6.0 5.0 - 7.5   Color, UA Yellow Yellow   Appearance Ur Clear Clear   Leukocytes,UA Negative Negative   Protein,UA Negative Negative/Trace   Glucose, UA Negative Negative   Ketones, UA Trace (A) Negative   RBC, UA Trace (A) Negative   Bilirubin, UA Negative Negative   Urobilinogen, Ur 1.0 0.2 - 1.0 mg/dL   Nitrite, UA Negative Negative   Microscopic Examination See below:   VITAMIN D 25 Hydroxy (Vit-D Deficiency, Fractures)  Result Value Ref Range   Vit D, 25-Hydroxy 25.9 (L) 30.0 - 100.0 ng/mL      Assessment & Plan:   Problem List Items Addressed This Visit      Genitourinary   Benign hypertensive renal disease    Under good control on current regimen. Continue current regimen. Continue to monitor. Call with any concerns. Refills given. Labs drawn today.       Relevant Orders   CBC with Differential/Platelet   Comprehensive metabolic panel   Urinalysis, Routine w reflex microscopic   TSH   Microalbumin, Urine Waived     Other   Hyperlipidemia    Under good control on current regimen. Continue current regimen. Continue to monitor. Call with any concerns. Refills given. Labs drawn today.      Relevant Medications   atorvastatin (LIPITOR) 20 MG tablet   lisinopril (ZESTRIL) 10 MG tablet   Other Relevant Orders   CBC with Differential/Platelet   Comprehensive metabolic panel   Lipid Panel w/o Chol/HDL Ratio    Other Visit Diagnoses     Routine general medical examination at a health care facility    -  Primary   Vaccines up to date. Screening labs checked today. Considering cologuard. Mammogram and DEXA  up to date. Continue diet and exercise. Call with any concerns.    History of vitamin D deficiency       Rechecking labs today. Await results.    Relevant Orders   VITAMIN D 25 Hydroxy (Vit-D Deficiency, Fractures)   Senile purpura ([place])       Reassued patient. Call with any concerns.    Relevant Medications   atorvastatin (LIPITOR) 20 MG tablet   lisinopril (ZESTRIL) 10 MG tablet   Open wound of left forearm, initial encounter       Healing well. Due for Td. Given today.   Relevant Orders   Td : Tetanus/diphtheria >7yo Preservative  free (Completed)       Follow up plan: Return in about 6 months (around 03/03/2021).   LABORATORY TESTING:  - Pap smear: not applicable  IMMUNIZATIONS:   - Tdap: Tetanus vaccination status reviewed: Td vaccination indicated and given today. - Influenza: Postponed to flu season - Pneumovax: Up to date - Prevnar: Up to date - COVID: Up to date  SCREENING: -Mammogram: Up to date  - Colonoscopy: Considering- they have been in contact  - Bone Density: Up to date    PATIENT COUNSELING:   Advised to take 1 mg of folate supplement per day if capable of pregnancy.   Sexuality: Discussed sexually transmitted diseases, partner selection, use of condoms, avoidance of unintended pregnancy  and contraceptive alternatives.   Advised to avoid cigarette smoking.  I discussed with the patient that most people either abstain from alcohol or drink within safe limits (<=14/week and <=4 drinks/occasion for males, <=7/weeks and <= 3 drinks/occasion for females) and that the risk for alcohol disorders and other health effects rises proportionally with the number of drinks per week and how often a drinker exceeds daily limits.  Discussed cessation/primary prevention of drug use and availability of  treatment for abuse.   Diet: Encouraged to adjust caloric intake to maintain  or achieve ideal body weight, to reduce intake of dietary saturated fat and total fat, to limit sodium intake by avoiding high sodium foods and not adding table salt, and to maintain adequate dietary potassium and calcium preferably from fresh fruits, vegetables, and low-fat dairy products.    stressed the importance of regular exercise  Injury prevention: Discussed safety belts, safety helmets, smoke detector, smoking near bedding or upholstery.   Dental health: Discussed importance of regular tooth brushing, flossing, and dental visits.    NEXT PREVENTATIVE PHYSICAL DUE IN 1 YEAR. Return in about 6 months (around 03/03/2021).

## 2020-09-01 NOTE — Patient Instructions (Signed)
Health Maintenance After Age 68 After age 68, you are at a higher risk for certain long-term diseases and infections as well as injuries from falls. Falls are a major cause of broken bones and head injuries in people who are older than age 68. Getting regular preventive care can help to keep you healthy and well. Preventive care includes getting regular testing and making lifestyle changes as recommended by your health care provider. Talk with your health care provider about:  Which screenings and tests you should have. A screening is a test that checks for a disease when you have no symptoms.  A diet and exercise plan that is right for you. What should I know about screenings and tests to prevent falls? Screening and testing are the best ways to find a health problem early. Early diagnosis and treatment give you the best chance of managing medical conditions that are common after age 68. Certain conditions and lifestyle choices may make you more likely to have a fall. Your health care provider may recommend:  Regular vision checks. Poor vision and conditions such as cataracts can make you more likely to have a fall. If you wear glasses, make sure to get your prescription updated if your vision changes.  Medicine review. Work with your health care provider to regularly review all of the medicines you are taking, including over-the-counter medicines. Ask your health care provider about any side effects that may make you more likely to have a fall. Tell your health care provider if any medicines that you take make you feel dizzy or sleepy.  Osteoporosis screening. Osteoporosis is a condition that causes the bones to get weaker. This can make the bones weak and cause them to break more easily.  Blood pressure screening. Blood pressure changes and medicines to control blood pressure can make you feel dizzy.  Strength and balance checks. Your health care provider may recommend certain tests to check your  strength and balance while standing, walking, or changing positions.  Foot health exam. Foot pain and numbness, as well as not wearing proper footwear, can make you more likely to have a fall.  Depression screening. You may be more likely to have a fall if you have a fear of falling, feel emotionally low, or feel unable to do activities that you used to do.  Alcohol use screening. Using too much alcohol can affect your balance and may make you more likely to have a fall. What actions can I take to lower my risk of falls? General instructions  Talk with your health care provider about your risks for falling. Tell your health care provider if: ? You fall. Be sure to tell your health care provider about all falls, even ones that seem minor. ? You feel dizzy, sleepy, or off-balance.  Take over-the-counter and prescription medicines only as told by your health care provider. These include any supplements.  Eat a healthy diet and maintain a healthy weight. A healthy diet includes low-fat dairy products, low-fat (lean) meats, and fiber from whole grains, beans, and lots of fruits and vegetables. Home safety  Remove any tripping hazards, such as rugs, cords, and clutter.  Install safety equipment such as grab bars in bathrooms and safety rails on stairs.  Keep rooms and walkways well-lit. Activity  Follow a regular exercise program to stay fit. This will help you maintain your balance. Ask your health care provider what types of exercise are appropriate for you.  If you need a cane or walker,   use it as recommended by your health care provider.  Wear supportive shoes that have nonskid soles.   Lifestyle  Do not drink alcohol if your health care provider tells you not to drink.  If you drink alcohol, limit how much you have: ? 0-1 drink a day for women. ? 0-2 drinks a day for men.  Be aware of how much alcohol is in your drink. In the [place] one drink equals one typical bottle of beer (12  oz), one-half glass of wine (5 oz), or one shot of hard liquor (1 oz).  Do not use any products that contain nicotine or tobacco, such as cigarettes and e-cigarettes. If you need help quitting, ask your health care provider. Summary  Having a healthy lifestyle and getting preventive care can help to protect your health and wellness after age 68.  Screening and testing are the best way to find a health problem early and help you avoid having a fall. Early diagnosis and treatment give you the best chance for managing medical conditions that are more common for people who are older than age 68.  Falls are a major cause of broken bones and head injuries in people who are older than age 68. Take precautions to prevent a fall at home.  Work with your health care provider to learn what changes you can make to improve your health and wellness and to prevent falls. This information is not intended to replace advice given to you by your health care provider. Make sure you discuss any questions you have with your health care provider. Document Revised: 08/22/2018 Document Reviewed: 03/14/2017 Elsevier Patient Education  2021 Elsevier Inc.  

## 2020-09-02 LAB — COMPREHENSIVE METABOLIC PANEL
ALT: 15 IU/L (ref 0–32)
AST: 13 IU/L (ref 0–40)
Albumin/Globulin Ratio: 2.1 (ref 1.2–2.2)
Albumin: 4.5 g/dL (ref 3.8–4.8)
Alkaline Phosphatase: 86 IU/L (ref 44–121)
BUN/Creatinine Ratio: 31 — ABNORMAL HIGH (ref 12–28)
BUN: 26 mg/dL (ref 8–27)
Bilirubin Total: 0.3 mg/dL (ref 0.0–1.2)
CO2: 24 mmol/L (ref 20–29)
Calcium: 9.6 mg/dL (ref 8.7–10.3)
Chloride: 101 mmol/L (ref 96–106)
Creatinine, Ser: 0.84 mg/dL (ref 0.57–1.00)
Globulin, Total: 2.1 g/dL (ref 1.5–4.5)
Glucose: 94 mg/dL (ref 65–99)
Potassium: 4.1 mmol/L (ref 3.5–5.2)
Sodium: 142 mmol/L (ref 134–144)
Total Protein: 6.6 g/dL (ref 6.0–8.5)
eGFR: 76 mL/min/{1.73_m2} (ref >59)

## 2020-09-02 LAB — CBC WITH DIFFERENTIAL/PLATELET
Basophils Absolute: 0.1 10*3/uL (ref 0.0–0.2)
Basos: 1 %
EOS (ABSOLUTE): 0.1 10*3/uL (ref 0.0–0.4)
Eos: 1 %
Hematocrit: 39.1 % (ref 34.0–46.6)
Hemoglobin: 13.1 g/dL (ref 11.1–15.9)
Immature Grans (Abs): 0 10*3/uL (ref 0.0–0.1)
Immature Granulocytes: 0 %
Lymphocytes Absolute: 2.1 10*3/uL (ref 0.7–3.1)
Lymphs: 27 %
MCH: 32.4 pg (ref 26.6–33.0)
MCHC: 33.5 g/dL (ref 31.5–35.7)
MCV: 97 fL (ref 79–97)
Monocytes Absolute: 0.7 10*3/uL (ref 0.1–0.9)
Monocytes: 8 %
Neutrophils Absolute: 4.9 10*3/uL (ref 1.4–7.0)
Neutrophils: 63 %
Platelets: 246 10*3/uL (ref 150–450)
RBC: 4.04 x10E6/uL (ref 3.77–5.28)
RDW: 12.4 % (ref 11.7–15.4)
WBC: 7.8 10*3/uL (ref 3.4–10.8)

## 2020-09-02 LAB — TSH: TSH: 1.93 u[IU]/mL (ref 0.450–4.500)

## 2020-09-02 LAB — LIPID PANEL W/O CHOL/HDL RATIO
Cholesterol, Total: 203 mg/dL — ABNORMAL HIGH (ref 100–199)
HDL: 75 mg/dL (ref >39)
LDL Chol Calc (NIH): 107 mg/dL — ABNORMAL HIGH (ref 0–99)
Triglycerides: 122 mg/dL (ref 0–149)
VLDL Cholesterol Cal: 21 mg/dL (ref 5–40)

## 2020-09-02 LAB — VITAMIN D 25 HYDROXY (VIT D DEFICIENCY, FRACTURES): Vit D, 25-Hydroxy: 57.6 ng/mL (ref 30.0–100.0)

## 2020-09-15 DIAGNOSIS — M25562 Pain in left knee: Secondary | ICD-10-CM | POA: Diagnosis not present

## 2020-09-21 DIAGNOSIS — S86191A Other injury of other muscle(s) and tendon(s) of posterior muscle group at lower leg level, right leg, initial encounter: Secondary | ICD-10-CM | POA: Diagnosis not present

## 2020-11-19 ENCOUNTER — Ambulatory Visit (INDEPENDENT_AMBULATORY_CARE_PROVIDER_SITE_OTHER): Payer: PPO

## 2020-11-19 VITALS — Ht 66.5 in | Wt 159.0 lb

## 2020-11-19 DIAGNOSIS — Z0000 Encounter for general adult medical examination without abnormal findings: Secondary | ICD-10-CM

## 2020-11-19 NOTE — Progress Notes (Signed)
I connected with Makayla [name] today by telephone and verified that I am speaking with the correct person using two identifiers. Location patient: home Location provider: work Persons participating in the virtual visit: Notnamed, Croucher LPN.   I discussed the limitations, risks, security and privacy concerns of performing an evaluation and management service by telephone and the availability of in person appointments. I also discussed with the patient that there may be a patient responsible charge related to this service. The patient expressed understanding and verbally consented to this telephonic visit.    Interactive audio and video telecommunications were attempted between this provider and patient, however failed, due to patient having technical difficulties OR patient did not have access to video capability.  We continued and completed visit with audio only.     Vital signs may be patient reported or missing.  Subjective:   Makayla [name] is a 70 y.o. female who presents for Medicare Annual (Subsequent) preventive examination.  Review of Systems     Cardiac Risk Factors include: advanced age (>23men, >8 women);dyslipidemia;hypertension     Objective:    Today's Vitals   11/19/20 1026 11/19/20 1027  Weight: 159 lb (72.1 kg)   Height: 5' 6.5" (1.689 m)   PainSc:  4    Body mass index is 25.28 kg/m.  Advanced Directives 11/19/2020 08/25/2019 07/03/2018  Does Patient Have a Medical Advance Directive? No No No  Does patient want to make changes to medical advance directive? - Yes (MAU/Ambulatory/Procedural Areas - Information given) -  Would patient like information on creating a medical advance directive? - - No - Patient declined    Current Medications (verified) Outpatient Encounter Medications as of 11/19/2020  Medication Sig   atorvastatin (LIPITOR) 20 MG tablet Take 1 tablet (20 mg total) by mouth daily.   ibuprofen (ADVIL,MOTRIN) 200 MG tablet Take 200  mg by mouth every 6 (six) hours as needed.   lisinopril (ZESTRIL) 10 MG tablet Take 1 tablet (10 mg total) by mouth daily.   omeprazole (PRILOSEC) 20 MG capsule TAKE 1 CAPSULE BY MOUTH EVERY DAY   meloxicam (MOBIC) 15 MG tablet Take 15 mg by mouth daily. (Patient not taking: Reported on 09/01/2020)   methocarbamol (ROBAXIN) 500 MG tablet Take 1 tablet (500 mg total) by mouth every 8 (eight) hours as needed for muscle spasms. (Patient not taking: No sig reported)   No facility-administered encounter medications on file as of 11/19/2020.    Allergies (verified) Crestor [rosuvastatin calcium]   History: Past Medical History:  Diagnosis Date   DDD (degenerative disc disease), lumbar    Hypertension    Sciatica    Past Surgical History:  Procedure Laterality Date   OVARIAN CYST REMOVAL Left    TUBAL LIGATION     X 2   Family History  Problem Relation Age of Onset   Breast cancer Sister 31   Stroke Mother    Cancer Brother        Melanoma- went to liver   Melanoma Brother    Liver cancer Brother    Liver cancer Brother    Social History   Socioeconomic History   Marital status: Married    Spouse name: Not on file   Number of children: Not on file   Years of education: Not on file   Highest education level: High school graduate  Occupational History   Occupation: retired  Tobacco Use   Smoking status: Former    Pack years: 0.00  Types: Cigarettes    Quit date: 03/23/2002    Years since quitting: 18.6   Smokeless tobacco: Never  Vaping Use   Vaping Use: Never used  Substance and Sexual Activity   Alcohol use: No   Drug use: No   Sexual activity: Not Currently  Other Topics Concern   Not on file  Social History Narrative   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    Social Determinants of Health   Financial Resource Strain: Low Risk    Difficulty of Paying Living Expenses: Not hard at all  Food Insecurity: No Food Insecurity   Worried About Charity fundraiser in the Last Year:  Never true   Arboriculturist in the Last Year: Never true  Transportation Needs: No Transportation Needs   Lack of Transportation (Medical): No   Lack of Transportation (Non-Medical): No  Physical Activity: Sufficiently Active   Days of Exercise per Week: 6 days   Minutes of Exercise per Session: 30 min  Stress: No Stress Concern Present   Feeling of Stress : Not at all  Social Connections: Not on file    Tobacco Counseling Counseling given: Not Answered   Clinical Intake:  Pre-visit preparation completed: Yes  Pain : 0-10 Pain Score: 4  Pain Type: Chronic pain Pain Location: Back Pain Orientation: Lower Pain Descriptors / Indicators: Aching Pain Onset: More than a month ago Pain Frequency: Constant     Nutritional Status: BMI 25 -29 Overweight Nutritional Risks: None Diabetes: No  How often do you need to have someone help you when you read instructions, pamphlets, or other written materials from your doctor or pharmacy?: 1 - Never What is the last grade level you completed in school?: 41yr college  Diabetic? no  Interpreter Needed?: No  Information entered by :: NAllen LPN   Activities of Daily Living In your present state of health, do you have any difficulty performing the following activities: 11/19/2020 09/01/2020  Hearing? N N  Vision? N N  Difficulty concentrating or making decisions? N N  Walking or climbing stairs? N N  Dressing or bathing? N N  Doing errands, shopping? N N  Preparing Food and eating ? N -  Using the Toilet? N -  In the past six months, have you accidently leaked urine? N -  Do you have problems with loss of bowel control? N -  Managing your Medications? N -  Managing your Finances? N -  Housekeeping or managing your Housekeeping? N -  Some recent data might be hidden    Patient Care Team: Valerie Roys, DO as PCP - General (Family Medicine) Dimmig, Marcello Moores, MD as Referring Physician (Orthopedic Surgery)  Indicate any recent  Medical Services you may have received from other than [facility] providers in the past year (date may be approximate).     Assessment:   This is a routine wellness examination for Makayla [name].  Hearing/Vision screen Vision Screening - Comments:: No regular eye exams, [facility]  Dietary issues and exercise activities discussed: Current Exercise Habits: Home exercise routine, Type of exercise: walking, Time (Minutes): 30, Frequency (Times/Week): 6, Weekly Exercise (Minutes/Week): 180   Goals Addressed             This Visit's Progress    Patient Stated       11/19/2020, wants to get rid of back pain        Depression Screen PHQ 2/9 Scores 11/19/2020 09/01/2020 08/25/2019 07/03/2018 06/19/2017 03/23/2017  PHQ - 2 Score 0  0 0 0 0 0  PHQ- 9 Score - - - - 0 -    Fall Risk Fall Risk  11/19/2020 09/01/2020 08/25/2019 04/21/2019 07/03/2018  Falls in the past year? 0 0 0 0 0  Number falls in past yr: - 0 0 0 0  Injury with Fall? - 0 0 0 0  Risk for fall due to : Medication side effect No Fall Risks - - -  Follow up Falls evaluation completed;Education provided;Falls prevention discussed Falls evaluation completed - - -    FALL RISK PREVENTION PERTAINING TO THE HOME:  Any stairs in or around the home? Yes  If so, are there any without handrails? No  Home free of loose throw rugs in walkways, pet beds, electrical cords, etc? Yes  Adequate lighting in your home to reduce risk of falls? Yes   ASSISTIVE DEVICES UTILIZED TO PREVENT FALLS:  Life alert? No  Use of a cane, walker or w/c? No  Grab bars in the bathroom? Yes  Shower chair or bench in shower? No  Elevated toilet seat or a handicapped toilet? No   TIMED UP AND GO:  Was the test performed? No .      Cognitive Function:     6CIT Screen 11/19/2020 08/25/2019 07/03/2018 06/19/2017  What Year? 0 points 0 points 0 points 0 points  What month? 0 points 0 points 0 points 0 points  What time? 0 points 0 points 0 points 0 points  Count back from  20 0 points 0 points 0 points 0 points  Months in reverse 0 points 0 points 0 points 0 points  Repeat phrase 2 points 0 points 0 points 0 points  Total Score 2 0 0 0    Immunizations Immunization History  Administered Date(s) Administered   Influenza, High Dose Seasonal PF 03/15/2018   Influenza,inj,Quad PF,6+ Mos 02/18/2019   Influenza-Unspecified 03/05/2017   Janssen (J&J) SARS-COV-2 Vaccination 07/25/2019   Pneumococcal Conjugate-13 06/19/2017   Pneumococcal Polysaccharide-23 05/15/2012, 07/03/2018   Td 09/01/2020   Tetanus 10/29/2005   Zoster, Live 01/25/2015    TDAP status: Up to date  Flu Vaccine status: Up to date  Pneumococcal vaccine status: Up to date  Covid-19 vaccine status: Declined, Education has been provided regarding the importance of this vaccine but patient still declined. Advised may receive this vaccine at local pharmacy or Health Dept.or vaccine clinic. Aware to provide a copy of the vaccination record if obtained from local pharmacy or Health Dept. Verbalized acceptance and understanding.  Qualifies for Shingles Vaccine? Yes   Zostavax completed Yes   Shingrix Completed?: No.    Education has been provided regarding the importance of this vaccine. Patient has been advised to call insurance company to determine out of pocket expense if they have not yet received this vaccine. Advised may also receive vaccine at local pharmacy or Health Dept. Verbalized acceptance and understanding.  Screening Tests Health Maintenance  Topic Date Due   Zoster Vaccines- Shingrix (1 of 2) Never done   COVID-19 Vaccine (2 - Booster for Janssen series) 12/05/2020 (Originally 09/19/2019)   COLONOSCOPY (Pts 45-30yrs Insurance coverage will need to be confirmed)  11/19/2021 (Originally 03/04/2019)   INFLUENZA VACCINE  12/13/2020   MAMMOGRAM  05/10/2022   TETANUS/TDAP  09/02/2030   DEXA SCAN  Completed   Hepatitis C Screening  Completed   PNA vac Low Risk Adult  Completed   HPV  VACCINES  Aged Out    Health Maintenance  Health Maintenance Due  Topic Date Due   Zoster Vaccines- Shingrix (1 of 2) Never done    Colorectal cancer screening: decline  Mammogram status: Completed 05/10/2020. Repeat every year  Bone Density status: Completed 05/10/2020  Lung Cancer Screening: (Low Dose CT Chest recommended if Age 26-80 years, 30 pack-year currently smoking OR have quit w/in 15years.) does not qualify.   Lung Cancer Screening Referral: no  Additional Screening:  Hepatitis C Screening: does qualify; Completed 06/19/2017  Vision Screening: Recommended annual ophthalmology exams for early detection of glaucoma and other disorders of the eye. Is the patient up to date with their annual eye exam?  Yes  Who is the provider or what is the name of the office in which the patient attends annual eye exams? [facility] If pt is not established with a provider, would they like to be referred to a provider to establish care? No .   Dental Screening: Recommended annual dental exams for proper oral hygiene  Community Resource Referral / Chronic Care Management: CRR required this visit?  No   CCM required this visit?  No      Plan:     I have personally reviewed and noted the following in the patient's chart:   Medical and social history Use of alcohol, tobacco or illicit drugs  Current medications and supplements including opioid prescriptions.  Functional ability and status Nutritional status Physical activity Advanced directives List of other physicians Hospitalizations, surgeries, and ER visits in previous 12 months Vitals Screenings to include cognitive, depression, and falls Referrals and appointments  In addition, I have reviewed and discussed with patient certain preventive protocols, quality metrics, and best practice recommendations. A written personalized care plan for preventive services as well as general preventive health recommendations were provided  to patient.     Kellie Simmering, LPN   08/16/6284   Nurse Notes:

## 2020-11-19 NOTE — Patient Instructions (Signed)
Ms. Siguenza , Thank you for taking time to come for your Medicare Wellness Visit. I appreciate your ongoing commitment to your health goals. Please review the following plan we discussed and let me know if I can assist you in the future.   Screening recommendations/referrals: Colonoscopy: decline Mammogram: completed 05/10/2020 Bone Density: completed 05/10/2020 Recommended yearly ophthalmology/optometry visit for glaucoma screening and checkup Recommended yearly dental visit for hygiene and checkup  Vaccinations: Influenza vaccine: due 12/13/2020 Pneumococcal vaccine: completed 07/03/2018 Tdap vaccine: completed 09/01/2020, due 09/02/2030 Shingles vaccine: discussed   Covid-19:07/25/2019  Advanced directives: Advance directive discussed with you today.   Conditions/risks identified: none  Next appointment: Follow up in one year for your annual wellness visit    Preventive Care 68 Years and Older, Female Preventive care refers to lifestyle choices and visits with your health care provider that can promote health and wellness. What does preventive care include? A yearly physical exam. This is also called an annual well check. Dental exams once or twice a year. Routine eye exams. Ask your health care provider how often you should have your eyes checked. Personal lifestyle choices, including: Daily care of your teeth and gums. Regular physical activity. Eating a healthy diet. Avoiding tobacco and drug use. Limiting alcohol use. Practicing safe sex. Taking low-dose aspirin every day. Taking vitamin and mineral supplements as recommended by your health care provider. What happens during an annual well check? The services and screenings done by your health care provider during your annual well check will depend on your age, overall health, lifestyle risk factors, and family history of disease. Counseling  Your health care provider may ask you questions about your: Alcohol use. Tobacco  use. Drug use. Emotional well-being. Home and relationship well-being. Sexual activity. Eating habits. History of falls. Memory and ability to understand (cognition). Work and work Statistician. Reproductive health. Screening  You may have the following tests or measurements: Height, weight, and BMI. Blood pressure. Lipid and cholesterol levels. These may be checked every 5 years, or more frequently if you are over 68 years old. Skin check. Lung cancer screening. You may have this screening every year starting at age 35 if you have a 30-pack-year history of smoking and currently smoke or have quit within the past 15 years. Fecal occult blood test (FOBT) of the stool. You may have this test every year starting at age 68. Flexible sigmoidoscopy or colonoscopy. You may have a sigmoidoscopy every 5 years or a colonoscopy every 10 years starting at age 68. Hepatitis C blood test. Hepatitis B blood test. Sexually transmitted disease (STD) testing. Diabetes screening. This is done by checking your blood sugar (glucose) after you have not eaten for a while (fasting). You may have this done every 1-3 years. Bone density scan. This is done to screen for osteoporosis. You may have this done starting at age 9. Mammogram. This may be done every 1-2 years. Talk to your health care provider about how often you should have regular mammograms. Talk with your health care provider about your test results, treatment options, and if necessary, the need for more tests. Vaccines  Your health care provider may recommend certain vaccines, such as: Influenza vaccine. This is recommended every year. Tetanus, diphtheria, and acellular pertussis (Tdap, Td) vaccine. You may need a Td booster every 10 years. Zoster vaccine. You may need this after age 68. Pneumococcal 13-valent conjugate (PCV13) vaccine. One dose is recommended after age 68. Pneumococcal polysaccharide (PPSV23) vaccine. One dose is recommended  after  age 68. Talk to your health care provider about which screenings and vaccines you need and how often you need them. This information is not intended to replace advice given to you by your health care provider. Make sure you discuss any questions you have with your health care provider. Document Released: 05/28/2015 Document Revised: 01/19/2016 Document Reviewed: 03/02/2015 Elsevier Interactive Patient Education  2017 [place] Prevention in the Home Falls can cause injuries. They can happen to people of all ages. There are many things you can do to make your home safe and to help prevent falls. What can I do on the outside of my home? Regularly fix the edges of walkways and driveways and fix any cracks. Remove anything that might make you trip as you walk through a door, such as a raised step or threshold. Trim any bushes or trees on the path to your home. Use bright outdoor lighting. Clear any walking paths of anything that might make someone trip, such as rocks or tools. Regularly check to see if handrails are loose or broken. Make sure that both sides of any steps have handrails. Any raised decks and porches should have guardrails on the edges. Have any leaves, snow, or ice cleared regularly. Use sand or salt on walking paths during winter. Clean up any spills in your garage right away. This includes oil or grease spills. What can I do in the bathroom? Use night lights. Install grab bars by the toilet and in the tub and shower. Do not use towel bars as grab bars. Use non-skid mats or decals in the tub or shower. If you need to sit down in the shower, use a plastic, non-slip stool. Keep the floor dry. Clean up any water that spills on the floor as soon as it happens. Remove soap buildup in the tub or shower regularly. Attach bath mats securely with double-sided non-slip rug tape. Do not have throw rugs and other things on the floor that can make you trip. What can I do  in the bedroom? Use night lights. Make sure that you have a light by your bed that is easy to reach. Do not use any sheets or blankets that are too big for your bed. They should not hang down onto the floor. Have a firm chair that has side arms. You can use this for support while you get dressed. Do not have throw rugs and other things on the floor that can make you trip. What can I do in the kitchen? Clean up any spills right away. Avoid walking on wet floors. Keep items that you use a lot in easy-to-reach places. If you need to reach something above you, use a strong step stool that has a grab bar. Keep electrical cords out of the way. Do not use floor polish or wax that makes floors slippery. If you must use wax, use non-skid floor wax. Do not have throw rugs and other things on the floor that can make you trip. What can I do with my stairs? Do not leave any items on the stairs. Make sure that there are handrails on both sides of the stairs and use them. Fix handrails that are broken or loose. Make sure that handrails are as long as the stairways. Check any carpeting to make sure that it is firmly attached to the stairs. Fix any carpet that is loose or worn. Avoid having throw rugs at the top or bottom of the stairs. If you do have  throw rugs, attach them to the floor with carpet tape. Make sure that you have a light switch at the top of the stairs and the bottom of the stairs. If you do not have them, ask someone to add them for you. What else can I do to help prevent falls? Wear shoes that: Do not have high heels. Have rubber bottoms. Are comfortable and fit you well. Are closed at the toe. Do not wear sandals. If you use a stepladder: Make sure that it is fully opened. Do not climb a closed stepladder. Make sure that both sides of the stepladder are locked into place. Ask someone to hold it for you, if possible. Clearly mark and make sure that you can see: Any grab bars or  handrails. First and last steps. Where the edge of each step is. Use tools that help you move around (mobility aids) if they are needed. These include: Canes. Walkers. Scooters. Crutches. Turn on the lights when you go into a dark area. Replace any light bulbs as soon as they burn out. Set up your furniture so you have a clear path. Avoid moving your furniture around. If any of your floors are uneven, fix them. If there are any pets around you, be aware of where they are. Review your medicines with your doctor. Some medicines can make you feel dizzy. This can increase your chance of falling. Ask your doctor what other things that you can do to help prevent falls. This information is not intended to replace advice given to you by your health care provider. Make sure you discuss any questions you have with your health care provider. Document Released: 02/25/2009 Document Revised: 10/07/2015 Document Reviewed: 06/05/2014 Elsevier Interactive Patient Education  2017 [employer].

## 2021-02-11 ENCOUNTER — Ambulatory Visit (INDEPENDENT_AMBULATORY_CARE_PROVIDER_SITE_OTHER): Payer: PPO | Admitting: Family Medicine

## 2021-02-11 ENCOUNTER — Encounter: Payer: Self-pay | Admitting: Family Medicine

## 2021-02-11 ENCOUNTER — Other Ambulatory Visit: Payer: Self-pay

## 2021-02-11 VITALS — BP 142/95 | HR 66 | Ht 65.5 in | Wt 159.0 lb

## 2021-02-11 DIAGNOSIS — R300 Dysuria: Secondary | ICD-10-CM

## 2021-02-11 DIAGNOSIS — N8110 Cystocele, unspecified: Secondary | ICD-10-CM

## 2021-02-11 LAB — URINALYSIS, ROUTINE W REFLEX MICROSCOPIC
Bilirubin, UA: NEGATIVE
Glucose, UA: NEGATIVE
Ketones, UA: NEGATIVE
Nitrite, UA: NEGATIVE
Protein,UA: NEGATIVE
Specific Gravity, UA: 1.02 (ref 1.005–1.030)
Urobilinogen, Ur: 0.2 mg/dL (ref 0.2–1.0)
pH, UA: 5.5 (ref 5.0–7.5)

## 2021-02-11 LAB — MICROSCOPIC EXAMINATION: Bacteria, UA: NONE SEEN

## 2021-02-11 NOTE — Progress Notes (Signed)
BP (!) 142/95   Pulse 66   Ht 5' 5.5" (1.664 m)   Wt 159 lb (72.1 kg)   BMI 26.06 kg/m    Subjective:    Patient ID: Makayla [name], female    DOB: September 08, 1950, 70 y.o.   MRN: [id number]  HPI: Makayla [name] is a 70 y.o. female  Chief Complaint  Patient presents with   Vaginal Prolapse   URINARY SYMPTOMS- started to notice a bulge in her vagina about a month ago after moving. It has not been feeling normal. Duration: about a month Dysuria: no Urinary frequency: yes Urgency: yes Small volume voids: no Symptom severity: moderate Urinary incontinence: no Foul odor: yes Hematuria: no Abdominal pain: no Back pain: yes Suprapubic pain/pressure: yes Flank pain: no Fever:  no Vomiting: no Relief with cranberry juice: no Relief with pyridium: no Status: slowly worsening Previous urinary tract infection: no Recurrent urinary tract infection: no Sexual activity: No sexually active History of sexually transmitted disease: no Vaginal discharge: no Treatments attempted: none    Relevant past medical, surgical, family and social history reviewed and updated as indicated. Interim medical history since our last visit reviewed. Allergies and medications reviewed and updated.  Review of Systems  Constitutional: Negative.   Respiratory: Negative.    Cardiovascular: Negative.   Gastrointestinal: Negative.   Genitourinary:  Positive for decreased urine volume, frequency and urgency. Negative for difficulty urinating, dyspareunia, dysuria, enuresis, flank pain, genital sores, hematuria, menstrual problem, pelvic pain, vaginal bleeding, vaginal discharge and vaginal pain.  Musculoskeletal: Negative.   Neurological: Negative.   Psychiatric/Behavioral: Negative.     Per HPI unless specifically indicated above     Objective:    BP (!) 142/95   Pulse 66   Ht 5' 5.5" (1.664 m)   Wt 159 lb (72.1 kg)   BMI 26.06 kg/m   Wt Readings from Last 3 Encounters:  02/11/21 159 lb  (72.1 kg)  11/19/20 159 lb (72.1 kg)  09/01/20 163 lb (73.9 kg)    Physical Exam Vitals and nursing note reviewed.  Constitutional:      General: She is not in acute distress.    Appearance: Normal appearance. She is not ill-appearing, toxic-appearing or diaphoretic.  HENT:     Head: Normocephalic and atraumatic.     Right Ear: External ear normal.     Left Ear: External ear normal.     Nose: Nose normal.     Mouth/Throat:     Mouth: Mucous membranes are moist.     Pharynx: Oropharynx is clear.  Eyes:     General: No scleral icterus.       Right eye: No discharge.        Left eye: No discharge.     Extraocular Movements: Extraocular movements intact.     Conjunctiva/sclera: Conjunctivae normal.     Pupils: Pupils are equal, round, and reactive to light.  Cardiovascular:     Rate and Rhythm: Normal rate and regular rhythm.     Pulses: Normal pulses.     Heart sounds: Normal heart sounds. No murmur heard.   No friction rub. No gallop.  Pulmonary:     Effort: Pulmonary effort is normal. No respiratory distress.     Breath sounds: Normal breath sounds. No stridor. No wheezing, rhonchi or rales.  Chest:     Chest wall: No tenderness.  Genitourinary:   Musculoskeletal:        General: Normal range of motion.  Cervical back: Normal range of motion and neck supple.  Skin:    General: Skin is warm and dry.     Capillary Refill: Capillary refill takes less than 2 seconds.     Coloration: Skin is not jaundiced or pale.     Findings: No bruising, erythema, lesion or rash.  Neurological:     General: No focal deficit present.     Mental Status: She is alert and oriented to person, place, and time. Mental status is at baseline.  Psychiatric:        Mood and Affect: Mood normal.        Behavior: Behavior normal.        Thought Content: Thought content normal.        Judgment: Judgment normal.    Results for orders placed or performed in visit on 09/01/20  Microscopic  Examination   Urine  Result Value Ref Range   WBC, UA None seen 0 - 5 /hpf   RBC 0-2 0 - 2 /hpf   Epithelial Cells (non renal) 0-10 0 - 10 /hpf   Bacteria, UA None seen None seen/Few  CBC with Differential/Platelet  Result Value Ref Range   WBC 7.8 3.4 - 10.8 x10E3/uL   RBC 4.04 3.77 - 5.28 x10E6/uL   Hemoglobin 13.1 11.1 - 15.9 g/dL   Hematocrit 39.1 34.0 - 46.6 %   MCV 97 79 - 97 fL   MCH 32.4 26.6 - 33.0 pg   MCHC 33.5 31.5 - 35.7 g/dL   RDW 12.4 11.7 - 15.4 %   Platelets 246 150 - 450 x10E3/uL   Neutrophils 63 Not Estab. %   Lymphs 27 Not Estab. %   Monocytes 8 Not Estab. %   Eos 1 Not Estab. %   Basos 1 Not Estab. %   Neutrophils Absolute 4.9 1.4 - 7.0 x10E3/uL   Lymphocytes Absolute 2.1 0.7 - 3.1 x10E3/uL   Monocytes Absolute 0.7 0.1 - 0.9 x10E3/uL   EOS (ABSOLUTE) 0.1 0.0 - 0.4 x10E3/uL   Basophils Absolute 0.1 0.0 - 0.2 x10E3/uL   Immature Granulocytes 0 Not Estab. %   Immature Grans (Abs) 0.0 0.0 - 0.1 x10E3/uL  Comprehensive metabolic panel  Result Value Ref Range   Glucose 94 65 - 99 mg/dL   BUN 26 8 - 27 mg/dL   Creatinine, Ser 0.84 0.57 - 1.00 mg/dL   eGFR 76 >59 mL/min/1.73   BUN/Creatinine Ratio 31 (H) 12 - 28   Sodium 142 134 - 144 mmol/L   Potassium 4.1 3.5 - 5.2 mmol/L   Chloride 101 96 - 106 mmol/L   CO2 24 20 - 29 mmol/L   Calcium 9.6 8.7 - 10.3 mg/dL   Total Protein 6.6 6.0 - 8.5 g/dL   Albumin 4.5 3.8 - 4.8 g/dL   Globulin, Total 2.1 1.5 - 4.5 g/dL   Albumin/Globulin Ratio 2.1 1.2 - 2.2   Bilirubin Total 0.3 0.0 - 1.2 mg/dL   Alkaline Phosphatase 86 44 - 121 IU/L   AST 13 0 - 40 IU/L   ALT 15 0 - 32 IU/L  Lipid Panel w/o Chol/HDL Ratio  Result Value Ref Range   Cholesterol, Total 203 (H) 100 - 199 mg/dL   Triglycerides 122 0 - 149 mg/dL   HDL 75 >39 mg/dL   VLDL Cholesterol Cal 21 5 - 40 mg/dL   LDL Chol Calc (NIH) 107 (H) 0 - 99 mg/dL  Urinalysis, Routine w reflex microscopic  Result Value Ref Range  Specific Gravity, UA <1.005 (L)  1.005 - 1.030   pH, UA 5.5 5.0 - 7.5   Color, UA Yellow Yellow   Appearance Ur Clear Clear   Leukocytes,UA Negative Negative   Protein,UA Negative Negative/Trace   Glucose, UA Negative Negative   Ketones, UA Negative Negative   RBC, UA Trace (A) Negative   Bilirubin, UA Negative Negative   Urobilinogen, Ur 0.2 0.2 - 1.0 mg/dL   Nitrite, UA Negative Negative   Microscopic Examination See below:   TSH  Result Value Ref Range   TSH 1.930 0.450 - 4.500 uIU/mL  Microalbumin, Urine Waived  Result Value Ref Range   Microalb, Ur Waived 10 0 - 19 mg/L   Creatinine, Urine Waived 50 10 - 300 mg/dL   Microalb/Creat Ratio <30 <30 mg/g  VITAMIN D 25 Hydroxy (Vit-D Deficiency, Fractures)  Result Value Ref Range   Vit D, 25-Hydroxy 57.6 30.0 - 100.0 ng/mL      Assessment & Plan:   Problem List Items Addressed This Visit   None Visit Diagnoses     Vaginal prolapse    -  Primary   Will get her into GYN. Call with any concerns. Conitnue to monitor.    Relevant Orders   Ambulatory referral to Obstetrics / Gynecology   Dysuria       UA clear- likely due to prolapse   Relevant Orders   Urinalysis, Routine w reflex microscopic        Follow up plan: No follow-ups on file.

## 2021-02-14 ENCOUNTER — Encounter: Payer: Self-pay | Admitting: Obstetrics and Gynecology

## 2021-02-14 ENCOUNTER — Encounter: Payer: Self-pay | Admitting: Family Medicine

## 2021-02-15 ENCOUNTER — Telehealth: Payer: Self-pay

## 2021-02-15 NOTE — Telephone Encounter (Signed)
Copied from [place] [phone]. Topic: Referral - Request for Referral >> Feb 14, 2021  3:46 PM Erick Blinks wrote: Has patient seen PCP for this complaint? Yes.   *If NO, is insurance requiring patient see PCP for this issue before PCP can refer them? Referral for which specialty: OBGYN  Preferred provider/office: [facility] (pt says they have an appt October 14th)  Reason for referral: Prolapse >> Feb 15, 2021  4:36 PM Yvette Rack wrote: Pt called for an update on referral request to [facility]. Pt stated that they still have the to October 14th appt available and she would like the referral to be placed so she can get scheduled.

## 2021-02-23 ENCOUNTER — Other Ambulatory Visit: Payer: Self-pay

## 2021-02-23 ENCOUNTER — Ambulatory Visit (INDEPENDENT_AMBULATORY_CARE_PROVIDER_SITE_OTHER): Payer: PPO | Admitting: Family Medicine

## 2021-02-23 ENCOUNTER — Encounter: Payer: Self-pay | Admitting: Family Medicine

## 2021-02-23 VITALS — BP 122/86 | HR 68 | Ht 65.5 in | Wt 159.0 lb

## 2021-02-23 DIAGNOSIS — Z23 Encounter for immunization: Secondary | ICD-10-CM

## 2021-02-23 DIAGNOSIS — E782 Mixed hyperlipidemia: Secondary | ICD-10-CM

## 2021-02-23 DIAGNOSIS — I129 Hypertensive chronic kidney disease with stage 1 through stage 4 chronic kidney disease, or unspecified chronic kidney disease: Secondary | ICD-10-CM | POA: Diagnosis not present

## 2021-02-23 MED ORDER — OMEPRAZOLE 20 MG PO CPDR: DELAYED_RELEASE_CAPSULE | ORAL | 1 refills | Status: DC

## 2021-02-23 MED ORDER — ATORVASTATIN CALCIUM 20 MG PO TABS: 20.0000 mg | ORAL_TABLET | Freq: Every day | ORAL | 1 refills | Status: DC

## 2021-02-23 MED ORDER — LISINOPRIL 10 MG PO TABS: 10.0000 mg | ORAL_TABLET | Freq: Every day | ORAL | 1 refills | Status: DC

## 2021-02-23 NOTE — Assessment & Plan Note (Signed)
Under good control on current regimen. Continue current regimen. Continue to monitor. Call with any concerns. Refills given. Labs drawn today.   

## 2021-02-23 NOTE — Progress Notes (Signed)
BP 122/86   Pulse 68   Ht 5' 5.5" (1.664 m)   Wt 159 lb (72.1 kg)   BMI 26.06 kg/m    Subjective:    Patient ID: Makayla [name], female    DOB: 1951-01-09, 70 y.o.   MRN: [id number]  HPI: ADORIA KAWAMOTO is a 70 y.o. female  Chief Complaint  Patient presents with   Hyperlipidemia   HYPERTENSION / [place] Satisfied with current treatment? yes Duration of hypertension: chronic BP monitoring frequency: not checking BP medication side effects: no Past BP meds: lisinopril Duration of hyperlipidemia: chronic Cholesterol medication side effects: no Cholesterol supplements: none Past cholesterol medications: atorvastatin Medication compliance: excellent compliance Aspirin: no Recent stressors: no Recurrent headaches: no Visual changes: no Palpitations: no Dyspnea: no Chest pain: no Lower extremity edema: no Dizzy/lightheaded: no  Relevant past medical, surgical, family and social history reviewed and updated as indicated. Interim medical history since our last visit reviewed. Allergies and medications reviewed and updated.  Review of Systems  Constitutional: Negative.   Respiratory: Negative.    Cardiovascular: Negative.   Gastrointestinal: Negative.   Musculoskeletal: Negative.   Psychiatric/Behavioral: Negative.     Per HPI unless specifically indicated above     Objective:    BP 122/86   Pulse 68   Ht 5' 5.5" (1.664 m)   Wt 159 lb (72.1 kg)   BMI 26.06 kg/m   Wt Readings from Last 3 Encounters:  02/23/21 159 lb (72.1 kg)  02/11/21 159 lb (72.1 kg)  11/19/20 159 lb (72.1 kg)    Physical Exam Vitals and nursing note reviewed.  Constitutional:      General: She is not in acute distress.    Appearance: Normal appearance. She is not ill-appearing, toxic-appearing or diaphoretic.  HENT:     Head: Normocephalic and atraumatic.     Right Ear: External ear normal.     Left Ear: External ear normal.     Nose: Nose normal.     Mouth/Throat:      Mouth: Mucous membranes are moist.     Pharynx: Oropharynx is clear.  Eyes:     General: No scleral icterus.       Right eye: No discharge.        Left eye: No discharge.     Extraocular Movements: Extraocular movements intact.     Conjunctiva/sclera: Conjunctivae normal.     Pupils: Pupils are equal, round, and reactive to light.  Cardiovascular:     Rate and Rhythm: Normal rate and regular rhythm.     Pulses: Normal pulses.     Heart sounds: Normal heart sounds. No murmur heard.   No friction rub. No gallop.  Pulmonary:     Effort: Pulmonary effort is normal. No respiratory distress.     Breath sounds: Normal breath sounds. No stridor. No wheezing, rhonchi or rales.  Chest:     Chest wall: No tenderness.  Musculoskeletal:        General: Normal range of motion.     Cervical back: Normal range of motion and neck supple.  Skin:    General: Skin is warm and dry.     Capillary Refill: Capillary refill takes less than 2 seconds.     Coloration: Skin is not jaundiced or pale.     Findings: No bruising, erythema, lesion or rash.  Neurological:     General: No focal deficit present.     Mental Status: She is alert and oriented to person, place,  and time. Mental status is at baseline.  Psychiatric:        Mood and Affect: Mood normal.        Behavior: Behavior normal.        Thought Content: Thought content normal.        Judgment: Judgment normal.    Results for orders placed or performed in visit on 02/11/21  Microscopic Examination   Urine  Result Value Ref Range   WBC, UA 0-5 0 - 5 /hpf   RBC 0-2 0 - 2 /hpf   Epithelial Cells (non renal) 0-10 0 - 10 /hpf   Mucus, UA Present (A) Not Estab.   Bacteria, UA None seen None seen/Few  Urinalysis, Routine w reflex microscopic  Result Value Ref Range   Specific Gravity, UA 1.020 1.005 - 1.030   pH, UA 5.5 5.0 - 7.5   Color, UA Yellow Yellow   Appearance Ur Clear Clear   Leukocytes,UA Trace (A) Negative   Protein,UA Negative  Negative/Trace   Glucose, UA Negative Negative   Ketones, UA Negative Negative   RBC, UA Trace (A) Negative   Bilirubin, UA Negative Negative   Urobilinogen, Ur 0.2 0.2 - 1.0 mg/dL   Nitrite, UA Negative Negative   Microscopic Examination See below:       Assessment & Plan:   Problem List Items Addressed This Visit       Genitourinary   Benign hypertensive renal disease    Under good control on current regimen. Continue current regimen. Continue to monitor. Call with any concerns. Refills given. Labs drawn today.       Relevant Orders   CBC with Differential/Platelet   Comprehensive metabolic panel   Lipid Panel w/o Chol/HDL Ratio     Other   Hyperlipidemia - Primary    Under good control on current regimen. Continue current regimen. Continue to monitor. Call with any concerns. Refills given. Labs drawn today.      Relevant Medications   lisinopril (ZESTRIL) 10 MG tablet   atorvastatin (LIPITOR) 20 MG tablet   Other Relevant Orders   CBC with Differential/Platelet   Comprehensive metabolic panel   Other Visit Diagnoses     Need for immunization against influenza       Relevant Orders   Flu Vaccine QUAD High Dose(Fluad) (Completed)        Follow up plan: Return in about 6 months (around 08/24/2021), or physical.

## 2021-02-24 LAB — CBC WITH DIFFERENTIAL/PLATELET
Basophils Absolute: 0.1 10*3/uL (ref 0.0–0.2)
Basos: 1 %
EOS (ABSOLUTE): 0.1 10*3/uL (ref 0.0–0.4)
Eos: 2 %
Hematocrit: 38.1 % (ref 34.0–46.6)
Hemoglobin: 12.7 g/dL (ref 11.1–15.9)
Immature Grans (Abs): 0 10*3/uL (ref 0.0–0.1)
Immature Granulocytes: 0 %
Lymphocytes Absolute: 2.5 10*3/uL (ref 0.7–3.1)
Lymphs: 32 %
MCH: 31.5 pg (ref 26.6–33.0)
MCHC: 33.3 g/dL (ref 31.5–35.7)
MCV: 95 fL (ref 79–97)
Monocytes Absolute: 0.6 10*3/uL (ref 0.1–0.9)
Monocytes: 8 %
Neutrophils Absolute: 4.6 10*3/uL (ref 1.4–7.0)
Neutrophils: 57 %
Platelets: 218 10*3/uL (ref 150–450)
RBC: 4.03 x10E6/uL (ref 3.77–5.28)
RDW: 12.1 % (ref 11.7–15.4)
WBC: 8 10*3/uL (ref 3.4–10.8)

## 2021-02-24 LAB — COMPREHENSIVE METABOLIC PANEL
ALT: 13 IU/L (ref 0–32)
AST: 14 IU/L (ref 0–40)
Albumin/Globulin Ratio: 2.4 — ABNORMAL HIGH (ref 1.2–2.2)
Albumin: 4.4 g/dL (ref 3.8–4.8)
Alkaline Phosphatase: 88 IU/L (ref 44–121)
BUN/Creatinine Ratio: 32 — ABNORMAL HIGH (ref 12–28)
BUN: 23 mg/dL (ref 8–27)
Bilirubin Total: 0.2 mg/dL (ref 0.0–1.2)
CO2: 26 mmol/L (ref 20–29)
Calcium: 9.7 mg/dL (ref 8.7–10.3)
Chloride: 106 mmol/L (ref 96–106)
Creatinine, Ser: 0.73 mg/dL (ref 0.57–1.00)
Globulin, Total: 1.8 g/dL (ref 1.5–4.5)
Glucose: 91 mg/dL (ref 70–99)
Potassium: 4.3 mmol/L (ref 3.5–5.2)
Sodium: 144 mmol/L (ref 134–144)
Total Protein: 6.2 g/dL (ref 6.0–8.5)
eGFR: 90 mL/min/{1.73_m2} (ref >59)

## 2021-02-24 LAB — LIPID PANEL W/O CHOL/HDL RATIO
Cholesterol, Total: 182 mg/dL (ref 100–199)
HDL: 64 mg/dL (ref >39)
LDL Chol Calc (NIH): 86 mg/dL (ref 0–99)
Triglycerides: 191 mg/dL — ABNORMAL HIGH (ref 0–149)
VLDL Cholesterol Cal: 32 mg/dL (ref 5–40)

## 2021-02-28 ENCOUNTER — Ambulatory Visit: Payer: PPO | Admitting: Obstetrics & Gynecology

## 2021-02-28 ENCOUNTER — Other Ambulatory Visit: Payer: Self-pay

## 2021-02-28 ENCOUNTER — Other Ambulatory Visit (HOSPITAL_COMMUNITY)
Admission: RE | Admit: 2021-02-28 | Discharge: 2021-02-28 | Disposition: A | Discharge status: Home / Self Care | Payer: PPO | Source: Ambulatory Visit | Attending: Obstetrics & Gynecology | Admitting: Obstetrics & Gynecology

## 2021-02-28 ENCOUNTER — Encounter: Payer: Self-pay | Admitting: Obstetrics & Gynecology

## 2021-02-28 VITALS — BP 120/70 | Ht 66.5 in | Wt 165.0 lb

## 2021-02-28 DIAGNOSIS — N814 Uterovaginal prolapse, unspecified: Secondary | ICD-10-CM

## 2021-02-28 DIAGNOSIS — Z124 Encounter for screening for malignant neoplasm of cervix: Secondary | ICD-10-CM | POA: Insufficient documentation

## 2021-02-28 NOTE — Progress Notes (Signed)
CC: prolapse Patient is a 70 yo [id number] WG who complains of a  new onset problem with vaginal bulge and pressure and shooting pains with some urinary frequency and urgency; denies incontinence, no nocturia, no vag bleeding . Problem started 3 months ago. Symptoms include: prolapse of tissue with straining, discomfort: moderate, and as above . Symptoms have gradually worsened.  Not currently sexually active.   PMHx: She  has a past medical history of DDD (degenerative disc disease), lumbar, Hypertension, and Sciatica. Also,  has a past surgical history that includes Tubal ligation and Ovarian cyst removal (Left)., family history includes Breast cancer (age of onset: 56) in her sister; Cancer in her brother; Liver cancer in her brother and brother; Melanoma in her brother; Stroke in her mother.,  reports that she quit smoking about 18 years ago. Her smoking use included cigarettes. She has never used smokeless tobacco. She reports that she does not drink alcohol and does not use drugs.  She has a current medication list which includes the following prescription(s): atorvastatin, ibuprofen, lisinopril, and omeprazole. Also, is allergic to crestor [rosuvastatin calcium].  Review of Systems  Constitutional:  Negative for chills, fever and malaise/fatigue.  HENT:  Negative for congestion, sinus pain and sore throat.   Eyes:  Negative for blurred vision and pain.  Respiratory:  Negative for cough and wheezing.   Cardiovascular:  Negative for chest pain and leg swelling.  Gastrointestinal:  Positive for abdominal pain. Negative for constipation, diarrhea, heartburn, nausea and vomiting.  Genitourinary:  Positive for frequency. Negative for dysuria, hematuria and urgency.  Musculoskeletal:  Negative for back pain, joint pain, myalgias and neck pain.  Skin:  Negative for itching and rash.  Neurological:  Negative for dizziness, tremors and weakness.  Endo/Heme/Allergies:  Bruises/bleeds easily.   Psychiatric/Behavioral:  Negative for depression. The patient is not nervous/anxious and does not have insomnia.    Objective: BP 120/70   Ht 5' 6.5" (1.689 m)   Wt 165 lb (74.8 kg)   BMI 26.23 kg/m  Physical Exam Constitutional:      General: She is not in acute distress.    Appearance: She is well-developed.  Genitourinary:     Right Labia: No rash or tenderness.    Left Labia: No tenderness or rash.    No vaginal erythema or bleeding.     Mild vaginal atrophy present.     Right Adnexa: not tender and no mass present.    Left Adnexa: not tender and no mass present.    No cervical motion tenderness, discharge, polyp or nabothian cyst.     Uterus is prolapsed.     Uterus is not enlarged.     No uterine mass detected.    Uterus exam comments: Gr 3 w valsalva prolapse Min cystocele.     Uterus is midaxial.     Pelvic exam was performed with patient in the lithotomy position.  HENT:     Head: Normocephalic and atraumatic.     Nose: Nose normal.  Abdominal:     General: There is no distension.     Palpations: Abdomen is soft.     Tenderness: There is no abdominal tenderness.  Musculoskeletal:        General: Normal range of motion.  Neurological:     Mental Status: She is alert and oriented to person, place, and time.     Cranial Nerves: No cranial nerve deficit.  Skin:    General: Skin is warm and dry.  Psychiatric:  Attention and Perception: Attention normal.        Mood and Affect: Mood and affect normal.        Speech: Speech normal.        Behavior: Behavior normal.        Thought Content: Thought content normal.        Judgment: Judgment normal.    ASSESSMENT/PLAN:   Problem List Items Addressed This Visit       Genitourinary   Uterine prolapse - Primary   Other Visit Diagnoses     Screening for cervical cancer       Relevant Orders   Cytology - PAP     Options of pessary mgt and hysterectomy as treatments; pt prefers surgery and  hysterectomy. Info gv.  Pros and cons and recovery discussed. No sign of incontinence or need for bladder procedures.  Barnett Applebaum, MD, [facility], [facility] 02/28/2021  3:36 PM

## 2021-02-28 NOTE — Patient Instructions (Signed)
Total Laparoscopic Hysterectomy A total laparoscopic hysterectomy is a minimally invasive surgery to remove the uterus and cervix. The fallopian tubes and ovaries can also be removed during this surgery, if necessary. This procedure may be done to treat problems such as: Growths in the uterus (uterine fibroids) that are not cancer but cause symptoms. A condition that causes the lining of the uterus to grow in other areas (endometriosis). Problems with pelvic support. Cancer of the cervix, ovaries, uterus, or tissue that lines the uterus (endometrium). Excessive bleeding in the uterus. After this procedure, you will no longer be able to have a baby, and you will no longer have a menstrual period. Tell a health care provider about: Any allergies you have. All medicines you are taking, including vitamins, herbs, eye drops, creams, and over-the-counter medicines. Any problems you or family members have had with anesthetic medicines. Any blood disorders you have. Any surgeries you have had. Any medical conditions you have. Whether you are pregnant or may be pregnant. What are the risks? Generally, this is a safe procedure. However, problems may occur, including: Infection. Bleeding. Blood clots in the legs or lungs. Allergic reactions to medicines. Damage to nearby structures or organs. Having to change from this surgery to one in which a large incision is made in the abdomen (abdominal hysterectomy). What happens before the procedure? Staying hydrated Follow instructions from your health care provider about hydration, which may include: Up to 2 hours before the procedure - you may continue to drink clear liquids, such as water, clear fruit juice, black coffee, and plain tea.  Eating and drinking restrictions Follow instructions from your health care provider about eating and drinking, which may include: 8 hours before the procedure - stop eating heavy meals or foods, such as meat, fried  foods, or fatty foods. 6 hours before the procedure - stop eating light meals or foods, such as toast or cereal. 6 hours before the procedure - stop drinking milk or drinks that contain milk. 2 hours before the procedure - stop drinking clear liquids. Medicines Ask your health care provider about: Changing or stopping your regular medicines. This is especially important if you are taking diabetes medicines or blood thinners. Taking medicines such as aspirin and ibuprofen. These medicines can thin your blood. Do not take these medicines unless your health care provider tells you to take them. Taking over-the-counter medicines, vitamins, herbs, and supplements. You may be asked to take medicine that helps you have a bowel movement (laxative) to prevent constipation. General instructions If you were asked to do bowel preparation before the procedure, follow instructions from your health care provider. This procedure can affect the way you feel about yourself. Talk with your health care provider about the physical and emotional changes hysterectomy may cause. Do not use any products that contain nicotine or tobacco for at least 4 weeks before the procedure. These products include cigarettes, chewing tobacco, and vaping devices, such as e-cigarettes. If you need help quitting, ask your health care provider. Plan to have a responsible adult take you home from the hospital or clinic. Plan to have a responsible adult care for you for the time you are told after you leave the hospital or clinic. This is important. Surgery safety Ask your health care provider: How your surgery site will be marked. What steps will be taken to help prevent infection. These may include: Removing hair at the surgery site. Washing skin with a germ-killing soap. Receiving antibiotic medicine. What happens during the  procedure? An IV will be inserted into one of your veins. You will be given one or more of the following: A  medicine to help you relax (sedative). A medicine to make you fall asleep (general anesthetic). A medicine to numb the area (local anesthetic). A medicine that is injected into your spine to numb the area below and slightly above the injection site (spinal anesthetic). A medicine that is injected into an area of your body to numb everything below the injection site (regional anesthetic). A gas will be used to inflate your abdomen. This will allow your surgeon to look inside your abdomen and do the surgery. Three or four small incisions will be made in your abdomen. A small device with a light (laparoscope) will be inserted into one of your incisions. Surgical instruments will be inserted through the other incisions in order to perform the procedure. Your uterus and cervix may be removed through your vagina or cut into small pieces and removed through the small incisions. Any other organs that need to be removed will also be removed this way. The gas will be released from inside your abdomen. Your incisions will be closed with stitches (sutures), skin glue, or adhesive strips. A bandage (dressing) may be placed over your incisions. The procedure may vary among health care providers and hospitals. What happens after the procedure? Your blood pressure, heart rate, breathing rate, and blood oxygen level will be monitored until you leave the hospital or clinic. You will be given medicine for pain as needed. You will be encouraged to walk as soon as possible. You will also use a device to help you breathe or do breathing exercises to keep your lungs clear. You may have to wear compression stockings. These stockings help to prevent blood clots and reduce swelling in your legs. You will need to wear a sanitary pad for vaginal discharge or bleeding. Summary Total laparoscopic hysterectomy is a procedure to remove your uterus, cervix, and sometimes the fallopian tubes and ovaries. This procedure can  affect the way you feel about yourself. Talk with your health care provider about the physical and emotional changes hysterectomy may cause. After this procedure, you will no longer be able to have a baby, and you will no longer have a menstrual period. You will be given pain medicine to control discomfort after this procedure. Plan to have a responsible adult take you home from the hospital or clinic. This information is not intended to replace advice given to you by your health care provider. Make sure you discuss any questions you have with your health care provider. Document Revised: 01/02/2020 Document Reviewed: 01/02/2020 Elsevier Patient Education  [place].

## 2021-03-02 LAB — CYTOLOGY - PAP: Diagnosis: NEGATIVE

## 2021-03-03 ENCOUNTER — Telehealth: Payer: Self-pay

## 2021-03-03 ENCOUNTER — Other Ambulatory Visit: Payer: Self-pay | Admitting: Obstetrics & Gynecology

## 2021-03-03 DIAGNOSIS — N814 Uterovaginal prolapse, unspecified: Secondary | ICD-10-CM

## 2021-03-03 NOTE — Telephone Encounter (Signed)
-----   Message from Gae Dry, MD sent at 02/28/2021  3:32 PM EDT ----- Regarding: Surgery .Surgery Booking Request Patient Full Name:  Makayla [name]  MRN: [id number]  DOB: [birth year]  Surgeon: Hoyt Koch, MD  Requested Surgery Date and Time: any Primary Diagnosis AND Code: Uterine prolapse Secondary Diagnosis and Code: N81.4 Surgical Procedure: TLH/BSO, Cystoscopy RNFA Requested?: No L&D Notification: No Admission Status: same day surgery Length of Surgery: 50 min Special Case Needs: No H&P: Yes Phone Interview???:  Yes Interpreter: No Medical Clearance:  No Special Scheduling Instructions: No Any known health/anesthesia issues, diabetes, sleep apnea, latex allergy, defibrillator/pacemaker?: No Acuity: P3   (P1 highest, P2 delay may cause harm, P3 low, elective gyn, P4 lowest) Post op follow up visits: yes

## 2021-03-03 NOTE — Telephone Encounter (Signed)
Called patient to schedule TLH/BSO, Cystoscopy w Kenton Kingfisher  DOS 11/10  H&P 10/31 @ 9:40   Pre-admit phone call appointment to be requested - date and time will be included on H&P paper work. Also all appointments will be updated on pt MyChart. Explained that this appointment has a call window. Based on the time scheduled will indicate if the call will be received within a 4 hour window before 1:00 or after.  Advised that pt may also receive calls from the hospital pharmacy and pre-service center.  Confirmed pt has [employer] as primary insurance. [employer] as Consulting civil engineer.

## 2021-03-14 ENCOUNTER — Ambulatory Visit (INDEPENDENT_AMBULATORY_CARE_PROVIDER_SITE_OTHER): Payer: PPO | Admitting: Obstetrics & Gynecology

## 2021-03-14 ENCOUNTER — Encounter
Admission: RE | Admit: 2021-03-14 | Discharge: 2021-03-14 | Disposition: A | Discharge status: Home / Self Care | Payer: PPO | Source: Ambulatory Visit | Attending: Obstetrics & Gynecology | Admitting: Obstetrics & Gynecology

## 2021-03-14 ENCOUNTER — Encounter: Payer: Self-pay | Admitting: Obstetrics & Gynecology

## 2021-03-14 ENCOUNTER — Other Ambulatory Visit: Payer: Self-pay

## 2021-03-14 VITALS — BP 120/70 | Ht 66.5 in | Wt 164.0 lb

## 2021-03-14 DIAGNOSIS — N814 Uterovaginal prolapse, unspecified: Secondary | ICD-10-CM

## 2021-03-14 HISTORY — DX: Uterovaginal prolapse, unspecified: N81.4

## 2021-03-14 HISTORY — DX: Family history of other specified conditions: Z84.89

## 2021-03-14 HISTORY — DX: Gastro-esophageal reflux disease without esophagitis: K21.9

## 2021-03-14 NOTE — H&P (View-Only) (Signed)
PRE-OPERATIVE HISTORY AND PHYSICAL EXAM  HPI:  Makayla [name] is a 70 y.o. [id number] No LMP recorded. Patient is postmenopausal.; she is being admitted for surgery related to pelvic relaxation.  She c/o vaginal bulge and pressure and shooting pains with some urinary frequency and urgency; denies incontinence, no nocturia, no vag bleeding . Problem started several months ago. Symptoms include: prolapse of tissue with straining, discomfort: moderate, and as above . Symptoms have gradually worsened.  Not currently sexually active.  Recent PAP normal.  PMHx: Past Medical History:  Diagnosis Date   DDD (degenerative disc disease), lumbar    Hypertension    Sciatica    Past Surgical History:  Procedure Laterality Date   OVARIAN CYST REMOVAL Left    TUBAL LIGATION     X 2   Family History  Problem Relation Age of Onset   Breast cancer Sister 9   Stroke Mother    Cancer Brother        Melanoma- went to liver   Melanoma Brother    Liver cancer Brother    Liver cancer Brother    Social History   Tobacco Use   Smoking status: Former    Types: Cigarettes    Quit date: 03/23/2002    Years since quitting: 18.9   Smokeless tobacco: Never  Vaping Use   Vaping Use: Never used  Substance Use Topics   Alcohol use: No   Drug use: No    Current Outpatient Medications:    acetaminophen (TYLENOL) 500 MG tablet, Take 500-1,000 mg by mouth every 8 (eight) hours as needed for moderate pain., Disp: , Rfl:    atorvastatin (LIPITOR) 20 MG tablet, Take 1 tablet (20 mg total) by mouth daily., Disp: 90 tablet, Rfl: 1   ibuprofen (ADVIL,MOTRIN) 200 MG tablet, Take 400 mg by mouth every 8 (eight) hours as needed for moderate pain., Disp: , Rfl:    lisinopril (ZESTRIL) 10 MG tablet, Take 1 tablet (10 mg total) by mouth daily., Disp: 90 tablet, Rfl: 1   Multiple Vitamins-Minerals (MULTIVITAMIN WITH MINERALS) tablet, Take 1 tablet by mouth daily., Disp: , Rfl:    omeprazole (PRILOSEC) 20 MG capsule,  TAKE 1 CAPSULE BY MOUTH EVERY DAY (Patient taking differently: Take 20 mg by mouth daily as needed (acid reflux).), Disp: 90 capsule, Rfl: 1   simethicone (MYLICON) 007 MG chewable tablet, Chew 125 mg by mouth every 6 (six) hours as needed for flatulence., Disp: , Rfl:  Allergies: Crestor [rosuvastatin calcium]  Review of Systems  Constitutional:  Negative for chills, fever and malaise/fatigue.  HENT:  Negative for congestion, sinus pain and sore throat.   Eyes:  Negative for blurred vision and pain.  Respiratory:  Negative for cough and wheezing.   Cardiovascular:  Negative for chest pain and leg swelling.  Gastrointestinal:  Negative for abdominal pain, constipation, diarrhea, heartburn, nausea and vomiting.  Genitourinary:  Positive for frequency and urgency. Negative for dysuria and hematuria.  Musculoskeletal:  Negative for back pain, joint pain, myalgias and neck pain.  Skin:  Negative for itching and rash.  Neurological:  Negative for dizziness, tremors and weakness.  Endo/Heme/Allergies:  Does not bruise/bleed easily.  Psychiatric/Behavioral:  Negative for depression. The patient is not nervous/anxious and does not have insomnia.    Objective: BP 120/70   Ht 5' 6.5" (1.689 m)   Wt 164 lb (74.4 kg)   BMI 26.07 kg/m   Filed Weights   03/14/21 0931  Weight: 164 lb (  74.4 kg)   Physical Exam Constitutional:      General: She is not in acute distress.    Appearance: She is well-developed.  HENT:     Head: Normocephalic and atraumatic. No laceration.     Right Ear: Hearing normal.     Left Ear: Hearing normal.     Mouth/Throat:     Pharynx: Uvula midline.  Eyes:     Pupils: Pupils are equal, round, and reactive to light.  Neck:     Thyroid: No thyromegaly.  Cardiovascular:     Rate and Rhythm: Normal rate and regular rhythm.     Heart sounds: No murmur heard.   No friction rub. No gallop.  Pulmonary:     Effort: Pulmonary effort is normal. No respiratory distress.      Breath sounds: Normal breath sounds. No wheezing.  Abdominal:     General: Bowel sounds are normal. There is no distension.     Palpations: Abdomen is soft.     Tenderness: There is no abdominal tenderness. There is no rebound.  Musculoskeletal:        General: Normal range of motion.     Cervical back: Normal range of motion and neck supple.  Neurological:     Mental Status: She is alert and oriented to person, place, and time.     Cranial Nerves: No cranial nerve deficit.  Skin:    General: Skin is warm and dry.  Psychiatric:        Judgment: Judgment normal.  Vitals reviewed.    Assessment: 1. Uterine prolapse   Plan TLH BSO Cystoscopy as surgical option  I have had a careful discussion with this patient about all the options available and the risk/benefits of each. I have fully informed this patient that surgery may subject her to a variety of discomforts and risks: She understands that most patients have surgery with little difficulty, but problems can happen ranging from minor to fatal. These include nausea, vomiting, pain, bleeding, infection, poor healing, hernia, or formation of adhesions. Unexpected reactions may occur from any drug or anesthetic given. Unintended injury may occur to other pelvic or abdominal structures such as Fallopian tubes, ovaries, bladder, ureter (tube from kidney to bladder), or bowel. Nerves going from the pelvis to the legs may be injured. Any such injury may require immediate or later additional surgery to correct the problem. Excessive blood loss requiring transfusion is very unlikely but possible. Dangerous blood clots may form in the legs or lungs. Physical and sexual activity will be restricted in varying degrees for an indeterminate period of time but most often 2-6 weeks.  Finally, she understands that it is impossible to list every possible undesirable effect and that the condition for which surgery is done is not always cured or significantly  improved, and in rare cases may be even worse.Ample time was given to answer all questions.  Barnett Applebaum, MD, [facility], [facility] 03/14/2021  10:04 AM

## 2021-03-14 NOTE — Patient Instructions (Signed)
Your procedure is scheduled on:03-24-21 Thursday Report to the Registration Desk on the 1st floor of the [facility].Then proceed to the 2nd floor Surgery Desk in the [facility] To find out your arrival time, please call [phone] between 1PM - 3PM on:03-23-21 Wednesday  REMEMBER: Instructions that are not followed completely may result in serious medical risk, up to and including death; or upon the discretion of your surgeon and anesthesiologist your surgery may need to be rescheduled.  Do not eat food after midnight the night before surgery.  No gum chewing, lozengers or hard candies.  You may however, drink CLEAR liquids up to 2 hours before you are scheduled to arrive for your surgery. Do not drink anything within 2 hours of your scheduled arrival time.  Clear liquids include: - water  - apple juice without pulp - gatorade (not RED, PURPLE, OR BLUE) - black coffee or tea (Do NOT add milk or creamers to the coffee or tea) Do NOT drink anything that is not on this list.  TAKE THESE MEDICATIONS THE MORNING OF SURGERY WITH A SIP OF WATER: -atorvastatin (LIPITOR) 20 MG tablet -omeprazole (PRILOSEC) 20 MG capsuletake one the night before and one on the morning of surgery - helps to prevent nausea after surgery.)  One week prior to surgery: Stop Anti-inflammatories (NSAIDS) such as. Advil, Aleve, Ibuprofen, Motrin, Naproxen, Naprosyn and Aspirin based products such as Excedrin, Goodys Powder, BC Powder.You may however, continue to take Tylenol if needed for pain up until the day of surgery  Stop [place] supplements/vitamins NOW until after surgery (Multiple Vitamins-Minerals (MULTIVITAMIN WITH MINERALS) tablet)  No Alcohol for 24 hours before or after surgery.  No Smoking including e-cigarettes for 24 hours prior to surgery.  No chewable tobacco products for at least 6 hours prior to surgery.  No nicotine patches on the day of surgery.  Do not use any "recreational"  drugs for at least a week prior to your surgery.  Please be advised that the combination of cocaine and anesthesia may have negative outcomes, up to and including death. If you test positive for cocaine, your surgery will be cancelled.  On the morning of surgery brush your teeth with toothpaste and water, you may rinse your mouth with mouthwash if you wish. Do not swallow any toothpaste or mouthwash.  Use CHG Soap as directed on instruction sheet.  Do not wear jewelry, make-up, hairpins, clips or nail polish.  Do not wear lotions, powders, or perfumes.   Do not shave body from the neck down 48 hours prior to surgery just in case you cut yourself which could leave a site for infection.  Also, freshly shaved skin may become irritated if using the CHG soap.  Contact lenses, hearing aids and dentures may not be worn into surgery.  Do not bring valuables to the hospital. [facility] is not responsible for any missing/lost belongings or valuables.   Notify your doctor if there is any change in your medical condition (cold, fever, infection).  Wear comfortable clothing (specific to your surgery type) to the hospital.  After surgery, you can help prevent lung complications by doing breathing exercises.  Take deep breaths and cough every 1-2 hours. Your doctor may order a device called an Incentive Spirometer to help you take deep breaths. When coughing or sneezing, hold a pillow firmly against your incision with both hands. This is called "splinting." Doing this helps protect your incision. It also decreases belly discomfort.  If you  are being admitted to the hospital overnight, leave your suitcase in the car. After surgery it may be brought to your room.  If you are being discharged the day of surgery, you will not be allowed to drive home. You will need a responsible adult (18 years or older) to drive you home and stay with you that night.   If you are taking public transportation, you  will need to have a responsible adult (18 years or older) with you. Please confirm with your physician that it is acceptable to use public transportation.   Please call the [facility]. at [phone] if you have any questions about these instructions.  Surgery Visitation Policy:  Patients undergoing a surgery or procedure may have one family member or support person with them as long as that person is not COVID-19 positive or experiencing its symptoms.  That person may remain in the waiting area during the procedure and may rotate out with other people.  Inpatient Visitation:    Visiting hours are 7 a.m. to 8 p.m. Up to two visitors ages 16+ are allowed at one time in a patient room. The visitors may rotate out with other people during the day. Visitors must check out when they leave, or other visitors will not be allowed. One designated support person may remain overnight. The visitor must pass COVID-19 screenings, use hand sanitizer when entering and exiting the patient's room and wear a mask at all times, including in the patient's room. Patients must also wear a mask when staff or their visitor are in the room. Masking is required regardless of vaccination status.

## 2021-03-14 NOTE — Progress Notes (Signed)
PRE-OPERATIVE HISTORY AND PHYSICAL EXAM  HPI:  Makayla [name] is a 70 y.o. [id number] No LMP recorded. Patient is postmenopausal.; she is being admitted for surgery related to pelvic relaxation.  She c/o vaginal bulge and pressure and shooting pains with some urinary frequency and urgency; denies incontinence, no nocturia, no vag bleeding . Problem started several months ago. Symptoms include: prolapse of tissue with straining, discomfort: moderate, and as above . Symptoms have gradually worsened.  Not currently sexually active.  Recent PAP normal.  PMHx: Past Medical History:  Diagnosis Date   DDD (degenerative disc disease), lumbar    Hypertension    Sciatica    Past Surgical History:  Procedure Laterality Date   OVARIAN CYST REMOVAL Left    TUBAL LIGATION     X 2   Family History  Problem Relation Age of Onset   Breast cancer Sister 8   Stroke Mother    Cancer Brother        Melanoma- went to liver   Melanoma Brother    Liver cancer Brother    Liver cancer Brother    Social History   Tobacco Use   Smoking status: Former    Types: Cigarettes    Quit date: 03/23/2002    Years since quitting: 18.9   Smokeless tobacco: Never  Vaping Use   Vaping Use: Never used  Substance Use Topics   Alcohol use: No   Drug use: No    Current Outpatient Medications:    acetaminophen (TYLENOL) 500 MG tablet, Take 500-1,000 mg by mouth every 8 (eight) hours as needed for moderate pain., Disp: , Rfl:    atorvastatin (LIPITOR) 20 MG tablet, Take 1 tablet (20 mg total) by mouth daily., Disp: 90 tablet, Rfl: 1   ibuprofen (ADVIL,MOTRIN) 200 MG tablet, Take 400 mg by mouth every 8 (eight) hours as needed for moderate pain., Disp: , Rfl:    lisinopril (ZESTRIL) 10 MG tablet, Take 1 tablet (10 mg total) by mouth daily., Disp: 90 tablet, Rfl: 1   Multiple Vitamins-Minerals (MULTIVITAMIN WITH MINERALS) tablet, Take 1 tablet by mouth daily., Disp: , Rfl:    omeprazole (PRILOSEC) 20 MG capsule,  TAKE 1 CAPSULE BY MOUTH EVERY DAY (Patient taking differently: Take 20 mg by mouth daily as needed (acid reflux).), Disp: 90 capsule, Rfl: 1   simethicone (MYLICON) 166 MG chewable tablet, Chew 125 mg by mouth every 6 (six) hours as needed for flatulence., Disp: , Rfl:  Allergies: Crestor [rosuvastatin calcium]  Review of Systems  Constitutional:  Negative for chills, fever and malaise/fatigue.  HENT:  Negative for congestion, sinus pain and sore throat.   Eyes:  Negative for blurred vision and pain.  Respiratory:  Negative for cough and wheezing.   Cardiovascular:  Negative for chest pain and leg swelling.  Gastrointestinal:  Negative for abdominal pain, constipation, diarrhea, heartburn, nausea and vomiting.  Genitourinary:  Positive for frequency and urgency. Negative for dysuria and hematuria.  Musculoskeletal:  Negative for back pain, joint pain, myalgias and neck pain.  Skin:  Negative for itching and rash.  Neurological:  Negative for dizziness, tremors and weakness.  Endo/Heme/Allergies:  Does not bruise/bleed easily.  Psychiatric/Behavioral:  Negative for depression. The patient is not nervous/anxious and does not have insomnia.    Objective: BP 120/70   Ht 5' 6.5" (1.689 m)   Wt 164 lb (74.4 kg)   BMI 26.07 kg/m   Filed Weights   03/14/21 0931  Weight: 164 lb (  74.4 kg)   Physical Exam Constitutional:      General: She is not in acute distress.    Appearance: She is well-developed.  HENT:     Head: Normocephalic and atraumatic. No laceration.     Right Ear: Hearing normal.     Left Ear: Hearing normal.     Mouth/Throat:     Pharynx: Uvula midline.  Eyes:     Pupils: Pupils are equal, round, and reactive to light.  Neck:     Thyroid: No thyromegaly.  Cardiovascular:     Rate and Rhythm: Normal rate and regular rhythm.     Heart sounds: No murmur heard.   No friction rub. No gallop.  Pulmonary:     Effort: Pulmonary effort is normal. No respiratory distress.      Breath sounds: Normal breath sounds. No wheezing.  Abdominal:     General: Bowel sounds are normal. There is no distension.     Palpations: Abdomen is soft.     Tenderness: There is no abdominal tenderness. There is no rebound.  Musculoskeletal:        General: Normal range of motion.     Cervical back: Normal range of motion and neck supple.  Neurological:     Mental Status: She is alert and oriented to person, place, and time.     Cranial Nerves: No cranial nerve deficit.  Skin:    General: Skin is warm and dry.  Psychiatric:        Judgment: Judgment normal.  Vitals reviewed.    Assessment: 1. Uterine prolapse   Plan TLH BSO Cystoscopy as surgical option  I have had a careful discussion with this patient about all the options available and the risk/benefits of each. I have fully informed this patient that surgery may subject her to a variety of discomforts and risks: She understands that most patients have surgery with little difficulty, but problems can happen ranging from minor to fatal. These include nausea, vomiting, pain, bleeding, infection, poor healing, hernia, or formation of adhesions. Unexpected reactions may occur from any drug or anesthetic given. Unintended injury may occur to other pelvic or abdominal structures such as Fallopian tubes, ovaries, bladder, ureter (tube from kidney to bladder), or bowel. Nerves going from the pelvis to the legs may be injured. Any such injury may require immediate or later additional surgery to correct the problem. Excessive blood loss requiring transfusion is very unlikely but possible. Dangerous blood clots may form in the legs or lungs. Physical and sexual activity will be restricted in varying degrees for an indeterminate period of time but most often 2-6 weeks.  Finally, she understands that it is impossible to list every possible undesirable effect and that the condition for which surgery is done is not always cured or significantly  improved, and in rare cases may be even worse.Ample time was given to answer all questions.  Barnett Applebaum, MD, [facility], [facility] 03/14/2021  10:04 AM

## 2021-03-14 NOTE — Patient Instructions (Signed)
Total Laparoscopic Hysterectomy, Care After The following information offers guidance on how to care for yourself after your procedure. Your health care provider may also give you more specific instructions. If you have problems or questions, contact your health care provider. What can I expect after the procedure? After the procedure, it is common to have: Pain, bruising, and numbness around your incisions. Tiredness (fatigue). Poor appetite. Less interest in sex. Vaginal discharge or bleeding. You will need to use a sanitary pad after this procedure. Feelings of sadness or other emotions. If your ovaries were also removed, it is also common to have symptoms of menopause, such as hot flashes, night sweats, and lack of sleep (insomnia). Follow these instructions at home: Medicines Take over-the-counter and prescription medicines only as told by your health care provider. Ask your health care provider if the medicine prescribed to you: Requires you to avoid driving or using machinery. Can cause constipation. You may need to take these actions to prevent or treat constipation: Drink enough fluid to keep your urine pale yellow. Take over-the-counter or prescription medicines. Eat foods that are high in fiber, such as beans, whole grains, and fresh fruits and vegetables. Limit foods that are high in fat and processed sugars, such as fried or sweet foods. Incision care  Follow instructions from your health care provider about how to take care of your incisions. Make sure you: Wash your hands with soap and water for at least 20 seconds before and after you change your bandage (dressing). If soap and water are not available, use hand sanitizer. Change your dressing as told by your health care provider. Leave stitches (sutures), skin glue, or adhesive strips in place. These skin closures may need to stay in place for 2 weeks or longer. If adhesive strip edges start to loosen and curl up, you may  trim the loose edges. Do not remove adhesive strips completely unless your health care provider tells you to do that. Check your incision areas every day for signs of infection. Check for: More redness, swelling, or pain. Fluid or blood. Warmth. Pus or a bad smell. Activity  Rest as told by your health care provider. Avoid sitting for a long time without moving. Get up to take short walks every 1-2 hours. This is important to improve blood flow and breathing. Ask for help if you feel weak or unsteady. Return to your normal activities as told by your health care provider. Ask your health care provider what activities are safe for you. Do not lift anything that is heavier than 10 lb (4.5 kg), or the limit that you are told, for one month after surgery or until your health care provider says that it is safe. If you were given a sedative during the procedure, it can affect you for several hours. Do not drive or operate machinery until your health care provider says that it is safe. Lifestyle Do not use any products that contain nicotine or tobacco. These products include cigarettes, chewing tobacco, and vaping devices, such as e-cigarettes. These can delay healing after surgery. If you need help quitting, ask your health care provider. Do not drink alcohol until your health care provider approves. General instructions  Do not douche, use tampons, or have sex for at least 6 weeks, or as told by your health care provider. If you struggle with physical or emotional changes after your procedure, speak with your health care provider or a therapist. Do not take baths, swim, or use a hot tub  until your health care provider approves. You may only be allowed to take showers for 2-3 weeks. Keep your dressing dry until your health care provider says it can be removed. Try to have someone at home with you for the first 1-2 weeks to help with your daily chores. Wear compression stockings as told by your health  care provider. These stockings help to prevent blood clots and reduce swelling in your legs. Keep all follow-up visits. This is important. Contact a health care provider if: You have any of these signs of infection: Chills or a fever. More redness, swelling, or pain around an incision. Fluid or blood coming from an incision. Warmth coming from an incision. Pus or a bad smell coming from an incision. An incision opens. You feel dizzy or light-headed. You have pain or bleeding when you urinate, or you are unable to urinate. You have abnormal vaginal discharge. You have pain that does not get better with medicine. Get help right away if: You have a fever and your symptoms suddenly get worse. You have severe abdominal pain. You have chest pain or shortness of breath. You faint. You have pain, swelling, or redness in your leg. You have heavy vaginal bleeding with blood clots, soaking through a sanitary pad in less than 1 hour. These symptoms may represent a serious problem that is an emergency. Do not wait to see if the symptoms will go away. Get medical help right away. Call your local emergency services (911 in the [place] Do not drive yourself to the hospital. Summary After the procedure, it is common to have pain and bruising around your incisions. Do not take baths, swim, or use a hot tub until your health care provider approves. Do not lift anything that is heavier than 10 lb (4.5 kg), or the limit that you are told, for one month after surgery or until your health care provider says that it is safe. Tell your health care provider if you have any signs or symptoms of infection after the procedure. Get help right away if you have severe abdominal pain, chest pain, shortness of breath, or heavy bleeding from your vagina. This information is not intended to replace advice given to you by your health care provider. Make sure you discuss any questions you have with your health care  provider. Document Revised: 01/02/2020 Document Reviewed: 01/02/2020 Elsevier Patient Education  [place].

## 2021-03-16 ENCOUNTER — Other Ambulatory Visit: Payer: Self-pay

## 2021-03-16 ENCOUNTER — Encounter
Admission: RE | Admit: 2021-03-16 | Discharge: 2021-03-16 | Disposition: A | Discharge status: Home / Self Care | Payer: PPO | Source: Ambulatory Visit | Attending: Obstetrics & Gynecology | Admitting: Obstetrics & Gynecology

## 2021-03-16 DIAGNOSIS — Z01818 Encounter for other preprocedural examination: Secondary | ICD-10-CM | POA: Diagnosis not present

## 2021-03-16 DIAGNOSIS — N814 Uterovaginal prolapse, unspecified: Secondary | ICD-10-CM | POA: Insufficient documentation

## 2021-03-16 DIAGNOSIS — I10 Essential (primary) hypertension: Secondary | ICD-10-CM | POA: Diagnosis not present

## 2021-03-16 LAB — TYPE AND SCREEN
ABO/RH(D): A POS
Antibody Screen: NEGATIVE

## 2021-03-24 ENCOUNTER — Ambulatory Visit
Admission: RE | Admit: 2021-03-24 | Discharge: 2021-03-24 | Disposition: A | Discharge status: Home / Self Care | Payer: PPO | Attending: Obstetrics & Gynecology | Admitting: Obstetrics & Gynecology

## 2021-03-24 ENCOUNTER — Other Ambulatory Visit: Payer: Self-pay

## 2021-03-24 ENCOUNTER — Encounter: Payer: Self-pay | Admitting: Obstetrics & Gynecology

## 2021-03-24 ENCOUNTER — Encounter
Admission: RE | Disposition: A | Discharge status: Home / Self Care | Payer: Self-pay | Source: Home / Self Care | Attending: Obstetrics & Gynecology

## 2021-03-24 ENCOUNTER — Ambulatory Visit: Payer: PPO | Admitting: Certified Registered"

## 2021-03-24 DIAGNOSIS — N838 Other noninflammatory disorders of ovary, fallopian tube and broad ligament: Secondary | ICD-10-CM | POA: Diagnosis not present

## 2021-03-24 DIAGNOSIS — M7989 Other specified soft tissue disorders: Secondary | ICD-10-CM | POA: Diagnosis not present

## 2021-03-24 DIAGNOSIS — K660 Peritoneal adhesions (postprocedural) (postinfection): Secondary | ICD-10-CM | POA: Diagnosis not present

## 2021-03-24 DIAGNOSIS — N814 Uterovaginal prolapse, unspecified: Secondary | ICD-10-CM | POA: Diagnosis not present

## 2021-03-24 DIAGNOSIS — Z79899 Other long term (current) drug therapy: Secondary | ICD-10-CM | POA: Insufficient documentation

## 2021-03-24 DIAGNOSIS — N812 Incomplete uterovaginal prolapse: Secondary | ICD-10-CM | POA: Diagnosis not present

## 2021-03-24 DIAGNOSIS — I10 Essential (primary) hypertension: Secondary | ICD-10-CM | POA: Diagnosis not present

## 2021-03-24 DIAGNOSIS — D259 Leiomyoma of uterus, unspecified: Secondary | ICD-10-CM | POA: Diagnosis not present

## 2021-03-24 DIAGNOSIS — Z87891 Personal history of nicotine dependence: Secondary | ICD-10-CM | POA: Diagnosis not present

## 2021-03-24 HISTORY — PX: TOTAL LAPAROSCOPIC HYSTERECTOMY WITH BILATERAL SALPINGO OOPHORECTOMY: SHX6845

## 2021-03-24 HISTORY — PX: CYSTOSCOPY: SHX5120

## 2021-03-24 LAB — ABO/RH: ABO/RH(D): A POS

## 2021-03-24 SURGERY — HYSTERECTOMY, TOTAL, LAPAROSCOPIC, WITH BILATERAL SALPINGO-OOPHORECTOMY
Anesthesia: General

## 2021-03-24 MED ORDER — OXYCODONE-ACETAMINOPHEN 5-325 MG PO TABS
ORAL_TABLET | ORAL | Status: AC
  Filled 2021-03-24: qty 1

## 2021-03-24 MED ORDER — SUGAMMADEX SODIUM 200 MG/2ML IV SOLN
INTRAVENOUS | Status: DC | PRN
  Administered 2021-03-24: 200 mg via INTRAVENOUS

## 2021-03-24 MED ORDER — BUPIVACAINE HCL (PF) 0.5 % IJ SOLN
INTRAMUSCULAR | Status: AC
  Filled 2021-03-24: qty 30

## 2021-03-24 MED ORDER — ACETAMINOPHEN 10 MG/ML IV SOLN
INTRAVENOUS | Status: DC | PRN
Start: 2021-03-24 — End: 2021-03-24
  Administered 2021-03-24: 1000 mg via INTRAVENOUS

## 2021-03-24 MED ORDER — ONDANSETRON HCL 4 MG/2ML IJ SOLN
INTRAMUSCULAR | Status: AC
  Filled 2021-03-24: qty 2

## 2021-03-24 MED ORDER — PHENYLEPHRINE HCL (PRESSORS) 10 MG/ML IV SOLN
INTRAVENOUS | Status: DC | PRN
  Administered 2021-03-24: 100 ug via INTRAVENOUS

## 2021-03-24 MED ORDER — BUPIVACAINE HCL (PF) 0.5 % IJ SOLN
INTRAMUSCULAR | Status: DC | PRN
  Administered 2021-03-24: 13 mL

## 2021-03-24 MED ORDER — FENTANYL CITRATE (PF) 100 MCG/2ML IJ SOLN
INTRAMUSCULAR | Status: AC
  Filled 2021-03-24: qty 2

## 2021-03-24 MED ORDER — ROCURONIUM BROMIDE 100 MG/10ML IV SOLN
INTRAVENOUS | Status: DC | PRN
  Administered 2021-03-24: 10 mg via INTRAVENOUS
  Administered 2021-03-24: 50 mg via INTRAVENOUS

## 2021-03-24 MED ORDER — OXYCODONE-ACETAMINOPHEN 5-325 MG PO TABS: 1.0000 | ORAL_TABLET | ORAL | 0 refills | Status: DC | PRN

## 2021-03-24 MED ORDER — KETAMINE HCL 50 MG/5ML IJ SOSY
PREFILLED_SYRINGE | INTRAMUSCULAR | Status: AC
  Filled 2021-03-24: qty 5

## 2021-03-24 MED ORDER — KETAMINE HCL 10 MG/ML IJ SOLN
INTRAMUSCULAR | Status: DC | PRN
  Administered 2021-03-24: 30 mg via INTRAVENOUS

## 2021-03-24 MED ORDER — DEXAMETHASONE SODIUM PHOSPHATE 10 MG/ML IJ SOLN
INTRAMUSCULAR | Status: DC | PRN
  Administered 2021-03-24: 10 mg via INTRAVENOUS

## 2021-03-24 MED ORDER — MIDAZOLAM HCL 2 MG/2ML IJ SOLN
INTRAMUSCULAR | Status: AC
  Filled 2021-03-24: qty 2

## 2021-03-24 MED ORDER — LACTATED RINGERS IV SOLN: INTRAVENOUS | Status: DC

## 2021-03-24 MED ORDER — ACETAMINOPHEN 10 MG/ML IV SOLN
INTRAVENOUS | Status: AC
  Filled 2021-03-24: qty 100

## 2021-03-24 MED ORDER — CHLORHEXIDINE GLUCONATE 0.12 % MT SOLN
OROMUCOSAL | Status: AC
  Filled 2021-03-24: qty 15

## 2021-03-24 MED ORDER — MIDAZOLAM HCL 2 MG/2ML IJ SOLN
INTRAMUSCULAR | Status: DC | PRN
  Administered 2021-03-24: 2 mg via INTRAVENOUS

## 2021-03-24 MED ORDER — LACTATED RINGERS IV SOLN
INTRAVENOUS | Status: AC | PRN
  Administered 2021-03-24: 100 mL

## 2021-03-24 MED ORDER — FENTANYL CITRATE (PF) 100 MCG/2ML IJ SOLN
INTRAMUSCULAR | Status: AC
  Administered 2021-03-24: 25 ug via INTRAVENOUS
  Filled 2021-03-24: qty 2

## 2021-03-24 MED ORDER — ORAL CARE MOUTH RINSE: 15.0000 mL | Freq: Once | OROMUCOSAL | Status: AC

## 2021-03-24 MED ORDER — CHLORHEXIDINE GLUCONATE 0.12 % MT SOLN
OROMUCOSAL | Status: AC
  Administered 2021-03-24: 15 mL via OROMUCOSAL
  Filled 2021-03-24: qty 15

## 2021-03-24 MED ORDER — ACETAMINOPHEN 325 MG PO TABS: 650.0000 mg | ORAL_TABLET | ORAL | Status: DC | PRN

## 2021-03-24 MED ORDER — 0.9 % SODIUM CHLORIDE (POUR BTL) OPTIME
TOPICAL | Status: DC | PRN
  Administered 2021-03-24: 500 mL

## 2021-03-24 MED ORDER — ONDANSETRON HCL 4 MG/2ML IJ SOLN
INTRAMUSCULAR | Status: DC | PRN
  Administered 2021-03-24: 4 mg via INTRAVENOUS

## 2021-03-24 MED ORDER — ONDANSETRON HCL 4 MG/2ML IJ SOLN
4.0000 mg | Freq: Once | INTRAMUSCULAR | Status: AC | PRN
  Administered 2021-03-24: 4 mg via INTRAVENOUS

## 2021-03-24 MED ORDER — MORPHINE SULFATE (PF) 2 MG/ML IV SOLN: 1.0000 mg | INTRAVENOUS | Status: DC | PRN

## 2021-03-24 MED ORDER — CHLORHEXIDINE GLUCONATE 0.12 % MT SOLN: 15.0000 mL | Freq: Once | OROMUCOSAL | Status: AC

## 2021-03-24 MED ORDER — LIDOCAINE HCL (CARDIAC) PF 100 MG/5ML IV SOSY
PREFILLED_SYRINGE | INTRAVENOUS | Status: DC | PRN
  Administered 2021-03-24: 50 mg via INTRAVENOUS

## 2021-03-24 MED ORDER — FENTANYL CITRATE (PF) 100 MCG/2ML IJ SOLN
25.0000 ug | INTRAMUSCULAR | Status: DC | PRN
  Administered 2021-03-24: 25 ug via INTRAVENOUS

## 2021-03-24 MED ORDER — OXYCODONE-ACETAMINOPHEN 5-325 MG PO TABS
1.0000 | ORAL_TABLET | ORAL | Status: DC | PRN
  Administered 2021-03-24: 1 via ORAL

## 2021-03-24 MED ORDER — ACETAMINOPHEN 650 MG RE SUPP
650.0000 mg | RECTAL | Status: DC | PRN
  Filled 2021-03-24: qty 1

## 2021-03-24 MED ORDER — PROPOFOL 10 MG/ML IV BOLUS
INTRAVENOUS | Status: AC
  Filled 2021-03-24: qty 20

## 2021-03-24 MED ORDER — POVIDONE-IODINE 10 % EX SWAB: 2.0000 "application " | Freq: Once | CUTANEOUS | Status: DC

## 2021-03-24 MED ORDER — FENTANYL CITRATE (PF) 100 MCG/2ML IJ SOLN
INTRAMUSCULAR | Status: DC | PRN
  Administered 2021-03-24 (×2): 50 ug via INTRAVENOUS

## 2021-03-24 MED ORDER — PROPOFOL 10 MG/ML IV BOLUS
INTRAVENOUS | Status: DC | PRN
  Administered 2021-03-24: 120 mg via INTRAVENOUS

## 2021-03-24 MED ORDER — SODIUM CHLORIDE 0.9 % IV SOLN
INTRAVENOUS | Status: AC
  Filled 2021-03-24: qty 2

## 2021-03-24 MED ORDER — SODIUM CHLORIDE 0.9 % IV SOLN
2.0000 g | INTRAVENOUS | Status: AC
  Administered 2021-03-24: 2 g via INTRAVENOUS

## 2021-03-24 SURGICAL SUPPLY — 64 items
ADH SKN CLS APL DERMABOND .7 (GAUZE/BANDAGES/DRESSINGS) ×2
APL PRP STRL LF DISP 70% ISPRP (MISCELLANEOUS) ×2
APL SRG 38 LTWT LNG FL B (MISCELLANEOUS)
APPLICATOR ARISTA FLEXITIP XL (MISCELLANEOUS) IMPLANT
BAG DRN RND TRDRP ANRFLXCHMBR (UROLOGICAL SUPPLIES) ×2
BAG URINE DRAIN 2000ML AR STRL (UROLOGICAL SUPPLIES) ×3 IMPLANT
BLADE SURG SZ11 CARB STEEL (BLADE) ×3 IMPLANT
CATH FOLEY 2WAY  5CC 16FR (CATHETERS) ×1
CATH FOLEY 2WAY 5CC 16FR (CATHETERS) ×2
CATH URTH 16FR FL 2W BLN LF (CATHETERS) ×2 IMPLANT
CHLORAPREP W/TINT 26 (MISCELLANEOUS) ×3 IMPLANT
DEFOGGER SCOPE WARMER CLEARIFY (MISCELLANEOUS) ×3 IMPLANT
DERMABOND ADVANCED (GAUZE/BANDAGES/DRESSINGS) ×1
DERMABOND ADVANCED .7 DNX12 (GAUZE/BANDAGES/DRESSINGS) ×2 IMPLANT
DEVICE SUTURE ENDOST 10MM (ENDOMECHANICALS) ×3 IMPLANT
DRAPE CAMERA CLOSED 9X96 (DRAPES) ×3 IMPLANT
DRSG TEGADERM 2-3/8X2-3/4 SM (GAUZE/BANDAGES/DRESSINGS) IMPLANT
ENDOSTITCH 0 SINGLE 48 (SUTURE) ×3 IMPLANT
GAUZE 4X4 16PLY ~~LOC~~+RFID DBL (SPONGE) ×6 IMPLANT
GLOVE SURG ENC MOIS LTX SZ8 (GLOVE) ×9 IMPLANT
GLOVE SURG UNDER LTX SZ8 (GLOVE) ×9 IMPLANT
GOWN STRL REUS W/ TWL LRG LVL3 (GOWN DISPOSABLE) ×8 IMPLANT
GOWN STRL REUS W/ TWL XL LVL3 (GOWN DISPOSABLE) ×6 IMPLANT
GOWN STRL REUS W/TWL LRG LVL3 (GOWN DISPOSABLE) ×12
GOWN STRL REUS W/TWL XL LVL3 (GOWN DISPOSABLE) ×9
GRASPER SUT TROCAR 14GX15 (MISCELLANEOUS) ×3 IMPLANT
HEMOSTAT ARISTA ABSORB 3G PWDR (HEMOSTASIS) IMPLANT
IRRIGATION STRYKERFLOW (MISCELLANEOUS) ×2 IMPLANT
IRRIGATOR STRYKERFLOW (MISCELLANEOUS) ×3
IV LACTATED RINGERS 1000ML (IV SOLUTION) ×6 IMPLANT
KIT PINK PAD W/HEAD ARE REST (MISCELLANEOUS) ×3
KIT PINK PAD W/HEAD ARM REST (MISCELLANEOUS) ×2 IMPLANT
KIT TURNOVER CYSTO (KITS) ×3 IMPLANT
LABEL OR SOLS (LABEL) ×3 IMPLANT
MANIFOLD NEPTUNE II (INSTRUMENTS) ×3 IMPLANT
MANIPULATOR VCARE LG CRV RETR (MISCELLANEOUS) ×3 IMPLANT
MANIPULATOR VCARE SML CRV RETR (MISCELLANEOUS) IMPLANT
MANIPULATOR VCARE STD CRV RETR (MISCELLANEOUS) IMPLANT
NEEDLE VERESS 14GA 120MM (NEEDLE) ×3 IMPLANT
NS IRRIG 500ML POUR BTL (IV SOLUTION) ×3 IMPLANT
OCCLUDER COLPOPNEUMO (BALLOONS) ×3 IMPLANT
PACK GYN LAPAROSCOPIC (MISCELLANEOUS) ×3 IMPLANT
PAD OB MATERNITY 4.3X12.25 (PERSONAL CARE ITEMS) ×3 IMPLANT
PAD PREP 24X41 OB/GYN DISP (PERSONAL CARE ITEMS) ×3 IMPLANT
RELOAD ENDO STITCH 2.0 (ENDOMECHANICALS) ×24
SCISSORS METZENBAUM CVD 33 (INSTRUMENTS) ×3 IMPLANT
SCRUB EXIDINE 4% CHG 4OZ (MISCELLANEOUS) ×3 IMPLANT
SET CYSTO W/LG BORE CLAMP LF (SET/KITS/TRAYS/PACK) ×3 IMPLANT
SET TRI-LUMEN FLTR TB AIRSEAL (TUBING) ×3 IMPLANT
SHEARS HARMONIC ACE PLUS 36CM (ENDOMECHANICALS) ×3 IMPLANT
SLEEVE ENDOPATH XCEL 5M (ENDOMECHANICALS) ×3 IMPLANT
SPONGE GAUZE 2X2 8PLY STRL LF (GAUZE/BANDAGES/DRESSINGS) IMPLANT
SURGILUBE 2OZ TUBE FLIPTOP (MISCELLANEOUS) ×3 IMPLANT
SUT ENDO VLOC 180-0-8IN (SUTURE) IMPLANT
SUT RELOAD ENDO STITCH 2 48X1 (ENDOMECHANICALS) ×16
SUT VIC AB 0 CT1 36 (SUTURE) ×3 IMPLANT
SUT VIC AB 4-0 FS2 27 (SUTURE) ×6 IMPLANT
SUT VICRYL 0 AB UR-6 (SUTURE) ×3 IMPLANT
SUTURE RELOAD END STTCH 2 48X1 (ENDOMECHANICALS) ×16 IMPLANT
SYR 10ML LL (SYRINGE) ×3 IMPLANT
SYR 50ML LL SCALE MARK (SYRINGE) ×3 IMPLANT
TROCAR PORT AIRSEAL 8X100 (TROCAR) ×3 IMPLANT
TROCAR XCEL NON-BLD 5MMX100MML (ENDOMECHANICALS) ×3 IMPLANT
WATER STERILE IRR 500ML POUR (IV SOLUTION) ×3 IMPLANT

## 2021-03-24 NOTE — Discharge Instructions (Signed)

## 2021-03-24 NOTE — Transfer of Care (Signed)
Immediate Anesthesia Transfer of Care Note  Patient: Makayla [name]  Procedure(s) Performed: TOTAL LAPAROSCOPIC HYSTERECTOMY WITH BILATERAL SALPINGO OOPHORECTOMY (Bilateral) CYSTOSCOPY  Patient Location: PACU  Anesthesia Type:General  Level of Consciousness: awake  Airway & Oxygen Therapy: Patient Spontanous Breathing  Post-op Assessment: Report given to RN and Post -op Vital signs reviewed and stable  Post vital signs: Reviewed and stable  Last Vitals:  Vitals Value Taken Time  BP 123/85 03/24/21 1212  Temp    Pulse 71 03/24/21 1214  Resp 17 03/24/21 1214  SpO2 100 % 03/24/21 1214  Vitals shown include unvalidated device data.  Last Pain:  Vitals:   03/24/21 0917  TempSrc: Temporal  PainSc: 0-No pain         Complications: No notable events documented.

## 2021-03-24 NOTE — Anesthesia Preprocedure Evaluation (Signed)
Anesthesia Evaluation  Patient identified by MRN, date of birth, ID band Patient awake    Reviewed: Allergy & Precautions, H&P , NPO status , Patient's Chart, lab work & pertinent test results, reviewed documented beta blocker date and time   Airway Mallampati: II  TM Distance: >3 FB Neck ROM: full    Dental  (+) Teeth Intact   Pulmonary neg pulmonary ROS, former smoker,    Pulmonary exam normal        Cardiovascular Exercise Tolerance: Good hypertension, On Medications negative cardio ROS Normal cardiovascular exam Rhythm:regular Rate:Normal     Neuro/Psych  Neuromuscular disease negative psych ROS   GI/Hepatic Neg liver ROS, GERD  Medicated,  Endo/Other  negative endocrine ROS  Renal/GU Renal disease  negative genitourinary   Musculoskeletal   Abdominal   Peds  Hematology negative hematology ROS (+)   Anesthesia Other Findings Past Medical History: No date: DDD (degenerative disc disease), lumbar No date: Family history of adverse reaction to anesthesia No date: GERD (gastroesophageal reflux disease) No date: Hypertension No date: Sciatica No date: Uterine prolapse Past Surgical History: No date: COLONOSCOPY No date: OVARIAN CYST REMOVAL; Left No date: TUBAL LIGATION     Comment:  X 2 BMI    Body Mass Index: 25.44 kg/m     Reproductive/Obstetrics negative OB ROS                             Anesthesia Physical Anesthesia Plan  ASA: 2  Anesthesia Plan: General ETT   Post-op Pain Management:    Induction:   PONV Risk Score and Plan: 4 or greater  Airway Management Planned:   Additional Equipment:   Intra-op Plan:   Post-operative Plan:   Informed Consent: I have reviewed the patients History and Physical, chart, labs and discussed the procedure including the risks, benefits and alternatives for the proposed anesthesia with the patient or authorized representative  who has indicated his/her understanding and acceptance.     Dental Advisory Given  Plan Discussed with: CRNA  Anesthesia Plan Comments:         Anesthesia Quick Evaluation

## 2021-03-24 NOTE — Interval H&P Note (Signed)
History and Physical Interval Note:  03/24/2021 9:46 AM  Makayla [name]  has presented today for surgery, with the diagnosis of Uterine prolapse.  The various methods of treatment have been discussed with the patient and family. After consideration of risks, benefits and other options for treatment, the patient has consented to  Procedure(s): TOTAL LAPAROSCOPIC HYSTERECTOMY WITH BILATERAL SALPINGO OOPHORECTOMY (Bilateral) CYSTOSCOPY (N/A) as a surgical intervention.  The patient's history has been reviewed, patient examined, no change in status, stable for surgery.  I have reviewed the patient's chart and labs.  Questions were answered to the patient's satisfaction.     Hoyt Koch

## 2021-03-24 NOTE — Op Note (Signed)
Operative Report:  PRE-OP DIAGNOSIS: Uterine prolapse   POST-OP DIAGNOSIS: Uterine prolapse   PROCEDURE: Procedure(s): TOTAL LAPAROSCOPIC HYSTERECTOMY WITH BILATERAL SALPINGO OOPHORECTOMY CYSTOSCOPY  SURGEON: Barnett Applebaum, MD, FACOG  ASSISTANT: Dr Georgianne Fick, No other capable assistant available, in surgery requiring high level assistant.  ANESTHESIA: General endotracheal anesthesia  ESTIMATED BLOOD LOSS: 70 mL  SPECIMENS: Uterus, Tubes, Ovaries.  COMPLICATIONS: None  DISPOSITION: stable to PACU  FINDINGS: Intraabdominal adhesions were noted along the midline anterior abdominal wall with omentum; no bowel entrapment.  Absent left ovary, atrophic right ovary and uterus.  PROCEDURE:  The patient was taken to the OR where anesthesia was administed. She was prepped and draped in the normal sterile fashion in the dorsal lithotomy position in the [place] stirrups. A time out was performed. A Graves speculum was inserted, the cervix was grasped with a single tooth tenaculum and the endometrial cavity was sounded. The cervix was progressively dilated to a size 18 [place] with [employer] dilators. A V-Care uterine manipulator was inserted in the usual fashion without incident. Gloves were changed and attention was turned to the abdomen.   An infraumbilical transverse 54mm skin incision was made with the scalpel after local anesthesia applied to the skin. A Veress-step needle was inserted in the usual fashion and confirmed using the hanging drop technique. A pneumoperitoneum was obtained by insufflation of CO2 (opening pressure of 36mmHg) to 65mmHg. An 8 mm trocar is then placed under direct visualization with the laparoscope. A diagnostic laparoscopy was performed yielding the previously described findings. Attention was turned to the left lower quadrant where after visualization of the inferior epigastric vessels a 58mm skin incision was made with the scalpel. A 5 mm laparoscopic port was inserted. The same  procedure was repeated in the right lower quadrant with a 8 mm trocar. Attention was turned to the left aspect of the uterus, where after visualization of the ureter, the round ligament was coagulated and transected using the 43mm Harmonic Scapel. The anterior and posterior leafs of the broad ligament were dissected off as the anterior one was coagulated and transected in a caudal direction towards the cuff of the uterine manipulator.  Attention was then turned to the left fallopian tube (absent ovary this side) which was recognized by visualization of the fimbria. The infundibulopelvic ligament and its blood vessels were carefully coagulated and transected using the Harmonic scapel.  Attention was turned to the right aspect of the uterus where the same procedure was performed.  The vesicouterine reflection of the peritoneum was dissected with the harmonic scapel and the bladder flap was created bluntly.  The uterine vessels were coagulated and transected bilaterally using first bipolar cautery and then the harmonic scapel. A 360 degree, circumferential colpotomy was done to completely amputate the uterus with cervix and tubes. Once the specimen was amputated it was delivered through the vagina.   The colpotomy was repaired in a simple running fashion using a delayed absorbable suture with an endo-stitch device.  Vaginal exam confirms complete closure.  The cavity was copiously irrigated. A survey of the pelvic cavity revealed adequate hemostasis and no injury to bowel, bladder, or ureter.  The umbilical and RLQ incisions are closed (fascia) with a 0 Vicryl suture using a fascia closure device.  The assistance of my assisting-physician was vital to resect and retract interchangably with self on each side.   A diagnostic cystoscopy was performed using saline distension of bladder with no lesions or injuries noted.  Bilateral urine flow from each ureteral  orifice is visualized.  At this point the procedure was  finalized. All the instruments were removed from the patient's body. Gas was expelled and patient is leveled.  Incisions are closed with skin adhesive.    Patient goes to recovery room in stable condition.  All sponge, instrument, and needle counts are correct x2.     Barnett Applebaum, MD, [facility], [facility] 03/24/2021  12:08 PM

## 2021-03-24 NOTE — Anesthesia Procedure Notes (Signed)
Procedure Name: Intubation Date/Time: 03/24/2021 10:22 AM Performed by: Biagio Borg, CRNA Pre-anesthesia Checklist: Patient identified, Emergency Drugs available, Suction available and Patient being monitored Patient Re-evaluated:Patient Re-evaluated prior to induction Oxygen Delivery Method: Circle system utilized Preoxygenation: Pre-oxygenation with 100% oxygen Induction Type: IV induction Ventilation: Mask ventilation without difficulty Laryngoscope Size: McGraph and 3 Grade View: Grade I Tube type: Oral Tube size: 7.0 mm Number of attempts: 1 Airway Equipment and Method: Stylet and Oral airway Placement Confirmation: ETT inserted through vocal cords under direct vision, positive ETCO2 and breath sounds checked- equal and bilateral Secured at: 21 cm Tube secured with: Tape Dental Injury: Teeth and Oropharynx as per pre-operative assessment

## 2021-03-25 ENCOUNTER — Other Ambulatory Visit: Payer: Self-pay | Admitting: Obstetrics & Gynecology

## 2021-03-25 ENCOUNTER — Telehealth: Payer: Self-pay

## 2021-03-25 LAB — SURGICAL PATHOLOGY

## 2021-03-25 MED ORDER — HYDROCODONE-ACETAMINOPHEN 5-325 MG PO TABS
1.0000 | ORAL_TABLET | Freq: Four times a day (QID) | ORAL | 0 refills | Status: DC | PRN
Start: 2021-03-25 — End: 2021-03-25

## 2021-03-25 MED ORDER — TRAMADOL HCL 50 MG PO TABS: 50.0000 mg | ORAL_TABLET | Freq: Four times a day (QID) | ORAL | 0 refills | Status: DC | PRN

## 2021-03-25 NOTE — Telephone Encounter (Signed)
Pt's daughter, Adonis Brook, calling; pt had surgery yesterday; is having a possible reaction to maybe the oxycodone or another medication that was used yesterday; face is red and burning; has taken benadryl.  Amy, another daughter, will be with pt when we call back.  [phone]

## 2021-03-25 NOTE — Telephone Encounter (Signed)
Check on patient.  Will change pain medicine, in case that is cause (unlikely); likely form medicine yesterday that will soon pass.  Let us know (or ER) if worsens/

## 2021-03-25 NOTE — Telephone Encounter (Signed)
Spoke c Makayla [name], another daughter; pt is the same; aware new rx sent in; aware to let us know if worsens or go to ED; aware probably from [facility]) from yesterday.  Will continue with benadryl as well.

## 2021-03-29 ENCOUNTER — Telehealth: Payer: Self-pay

## 2021-03-29 NOTE — Telephone Encounter (Signed)
Pt calling; had TLH Thurs; no BM since; has taken stool softeners, laxatives, even a suppository - nothing is working.  [phone]

## 2021-03-29 NOTE — Telephone Encounter (Signed)
Dulcolax prefered option, also give it time after a surgery.  Enema OK if desires to do that

## 2021-03-30 DIAGNOSIS — R1032 Left lower quadrant pain: Secondary | ICD-10-CM | POA: Diagnosis not present

## 2021-03-30 DIAGNOSIS — K828 Other specified diseases of gallbladder: Secondary | ICD-10-CM | POA: Diagnosis not present

## 2021-03-30 DIAGNOSIS — T402X5A Adverse effect of other opioids, initial encounter: Secondary | ICD-10-CM | POA: Diagnosis not present

## 2021-03-30 DIAGNOSIS — K6289 Other specified diseases of anus and rectum: Secondary | ICD-10-CM | POA: Diagnosis not present

## 2021-03-30 DIAGNOSIS — Z79899 Other long term (current) drug therapy: Secondary | ICD-10-CM | POA: Diagnosis not present

## 2021-03-30 DIAGNOSIS — E785 Hyperlipidemia, unspecified: Secondary | ICD-10-CM | POA: Diagnosis not present

## 2021-03-30 DIAGNOSIS — M545 Low back pain, unspecified: Secondary | ICD-10-CM | POA: Diagnosis not present

## 2021-03-30 DIAGNOSIS — G8929 Other chronic pain: Secondary | ICD-10-CM | POA: Diagnosis not present

## 2021-03-30 DIAGNOSIS — K5900 Constipation, unspecified: Secondary | ICD-10-CM | POA: Diagnosis not present

## 2021-03-30 DIAGNOSIS — T797XXA Traumatic subcutaneous emphysema, initial encounter: Secondary | ICD-10-CM | POA: Diagnosis not present

## 2021-03-30 DIAGNOSIS — I10 Essential (primary) hypertension: Secondary | ICD-10-CM | POA: Diagnosis not present

## 2021-03-30 DIAGNOSIS — R1031 Right lower quadrant pain: Secondary | ICD-10-CM | POA: Diagnosis not present

## 2021-03-30 DIAGNOSIS — Z90710 Acquired absence of both cervix and uterus: Secondary | ICD-10-CM | POA: Diagnosis not present

## 2021-03-30 DIAGNOSIS — K5903 Drug induced constipation: Secondary | ICD-10-CM | POA: Diagnosis not present

## 2021-03-30 NOTE — Telephone Encounter (Signed)
See prev msg from 03/29/21.

## 2021-03-30 NOTE — Telephone Encounter (Signed)
Pt tx'd from [facility] this am.  Makayla [name] to ED last night but there was a 12 hour wait so she left; states she was up all night in severe pain; needs to be seen by someone today; also states there is something hanging out; doesn't know what it is but it's big.  Pt aware of PH's response; states has tried dulcolax; not sure she can get an enema or anything to go in at this point.  Adv can go to [place] or an UC.

## 2021-03-31 NOTE — Telephone Encounter (Signed)
Pt does indeed have a vaginal incision (from hysterectomy) and should monitor for bleeding.  Low risk and we give this 6 weeks or so for time to heal.  Hope the constipation resolves itself.

## 2021-03-31 NOTE — Anesthesia Postprocedure Evaluation (Signed)
Anesthesia Post Note  Patient: Makayla [name]  Procedure(s) Performed: TOTAL LAPAROSCOPIC HYSTERECTOMY WITH BILATERAL SALPINGO OOPHORECTOMY (Bilateral) CYSTOSCOPY  Patient location during evaluation: PACU Anesthesia Type: General Level of consciousness: awake and alert Pain management: pain level controlled Vital Signs Assessment: post-procedure vital signs reviewed and stable Respiratory status: spontaneous breathing, nonlabored ventilation, respiratory function stable and patient connected to nasal cannula oxygen Cardiovascular status: blood pressure returned to baseline and stable Postop Assessment: no apparent nausea or vomiting Anesthetic complications: no   No notable events documented.   Last Vitals:  Vitals:   03/24/21 1311 03/24/21 1420  BP: (!) 152/84 (P) 123/77  Pulse: 79 (P) 70  Resp: 16 (P) 16  Temp: (!) 36.3 C   SpO2: 99% (P) 95%    Last Pain:  Vitals:   03/25/21 0839  TempSrc:   PainSc: [place] Nechuma Boven

## 2021-03-31 NOTE — Telephone Encounter (Signed)
Pt calling; went to [facility] yesterday; CT showed she was extremely impacted; had been taking stool softeners; stopped taking medicine after 3d; now is very sore - they had to take it out; is worried she might get an inf or something.  [phone]  Pt states she does not have hemorrhoids.  Is afraid of getting an inf b/c of the sitz baths she's doing and was told not to sit in water; adv of course to use clean water; wipe from front to back; get some Tucks pads and put in refrigerator and use them to wipe with; adv no incisions in vagina; all incisions are on her abdomen.  Pt reassured.  Wanted to let us know.

## 2021-03-31 NOTE — Telephone Encounter (Signed)
Pt aware.

## 2021-04-04 ENCOUNTER — Encounter: Payer: Self-pay | Admitting: Obstetrics & Gynecology

## 2021-04-04 ENCOUNTER — Ambulatory Visit (INDEPENDENT_AMBULATORY_CARE_PROVIDER_SITE_OTHER): Payer: BC Managed Care – PPO | Admitting: Obstetrics & Gynecology

## 2021-04-04 ENCOUNTER — Other Ambulatory Visit: Payer: Self-pay

## 2021-04-04 VITALS — BP 120/80 | Ht 66.5 in | Wt 162.0 lb

## 2021-04-04 DIAGNOSIS — Z90710 Acquired absence of both cervix and uterus: Secondary | ICD-10-CM

## 2021-04-04 NOTE — Progress Notes (Signed)
  Postoperative Follow-up Patient presents post op from  [facility] BSO  for pelvic relaxation, 2 weeks ago.  Path: DIAGNOSIS:  A. UTERUS WITH CERVIX, BILATERAL FALLOPIAN TUBES AND OVARIES; TOTAL  HYSTERECTOMY WITH BILATERAL SALPINGO-OOPHORECTOMY:  - UTERINE CERVIX:       - BENIGN TRANSFORMATION ZONE.       - NEGATIVE FOR SQUAMOUS INTRAEPITHELIAL LESION AND MALIGNANCY.  - ENDOMETRIUM:       - WEAKLY PROLIFERATIVE ENDOMETRIUM.       - NEGATIVE FOR ATYPICAL HYPERPLASIA/EIN AND MALIGNANCY.  - MYOMETRIUM:       - SINGLE HYALINIZED LEIOMYOMA UTERI.       - NEGATIVE FOR FEATURES OF MALIGNANCY.  - FALLOPIAN TUBES:       - BENIGN PARATUBAL CYSTS.       - CHANGES SUGGESTIVE OF PRIOR TUBAL LIGATION.       - NEGATIVE FOR ATYPIA AND MALIGNANCY.  - OVARIES:       - BENIGN PHYSIOLOGIC CHANGES.  - SINGLE INCIDENTAL FOCUS OF REMOTE FAT NECROSIS.   Images    Subjective: Patient reports marked improvement in her preop symptoms. Eating a regular diet without difficulty. The patient is not having any pain.  Activity: normal activities of daily living. Patient reports additional symptom's since surgery of constipation, has been using Miralax and Colace.  Objective: BP 120/80   Ht 5' 6.5" (1.689 m)   Wt 162 lb (73.5 kg)   BMI 25.76 kg/m  Physical Exam Constitutional:      General: She is not in acute distress.    Appearance: She is well-developed.  Cardiovascular:     Rate and Rhythm: Normal rate.  Pulmonary:     Effort: Pulmonary effort is normal.  Abdominal:     General: There is no distension.     Palpations: Abdomen is soft.     Tenderness: There is no abdominal tenderness.     Comments: Incision Healing Well   Musculoskeletal:        General: Normal range of motion.  Neurological:     Mental Status: She is alert and oriented to person, place, and time.     Cranial Nerves: No cranial nerve deficit.  Skin:    General: Skin is warm and dry.    Assessment: s/p :   [facility] BSO  progressing  well  Plan: Patient has done well after surgery with no apparent complications.  I have discussed the post-operative course to date, and the expected progress moving forward.  The patient understands what complications to be concerned about.  I will see the patient in routine follow up, or sooner if needed.    Activity plan: No restriction except Pelvic rest.  Hoyt Koch 04/04/2021, 10:11 AM

## 2021-04-25 ENCOUNTER — Telehealth: Payer: Self-pay

## 2021-04-25 NOTE — Telephone Encounter (Signed)
Pt called after hour nurse 04/23/21 9;17AM; had hyst on Nov; began with vaginal and rectal itching and burning 3d ago; has thick, white d/c; no recent antibx use; no fever.  After hour nurse adv pt to use otc anitfungal medicine for yeast.  [phone]  Pt states she is a little better; doesn't have the d/c anymore; only applied cream to the outside - none inside.  Pt c/o having lower abd pain; states she is just going to take it easy today and see if that helps; may have overdone it.  Adv to call if problems.

## 2021-05-03 ENCOUNTER — Other Ambulatory Visit: Payer: Self-pay

## 2021-05-03 ENCOUNTER — Ambulatory Visit (INDEPENDENT_AMBULATORY_CARE_PROVIDER_SITE_OTHER): Payer: BC Managed Care – PPO | Admitting: Obstetrics & Gynecology

## 2021-05-03 ENCOUNTER — Encounter: Payer: Self-pay | Admitting: Obstetrics & Gynecology

## 2021-05-03 VITALS — BP 120/80 | Ht 66.5 in | Wt 164.0 lb

## 2021-05-03 DIAGNOSIS — Z90710 Acquired absence of both cervix and uterus: Secondary | ICD-10-CM

## 2021-05-03 NOTE — Patient Instructions (Signed)
Replens for vaginal dryness

## 2021-05-03 NOTE — Progress Notes (Signed)
°  Postoperative Follow-up Patient presents post op from  [facility] BSO  for pelvic relaxation, 6 weeks ago.  Subjective: Patient reports marked improvement in her preop symptoms. Eating a regular diet without difficulty. The patient is not having any pain.  Activity: normal activities of daily living. Patient reports additional symptom's since surgery of No bleeding.  SHe does report vaginal dryness.  She does not plan on vaginal sexuality.  Objective: BP 120/80    Ht 5' 6.5" (1.689 m)    Wt 164 lb (74.4 kg)    BMI 26.07 kg/m  Physical Exam Constitutional:      General: She is not in acute distress.    Appearance: She is well-developed.  Genitourinary:     Rectum normal.     Genitourinary Comments: Cervix and uterus absent. Vaginal cuff healing well.     No vaginal erythema or bleeding.      Right Adnexa: not tender and no mass present.    Left Adnexa: not tender and no mass present.    Cervix is absent.     Uterus is absent.  Cardiovascular:     Rate and Rhythm: Normal rate.  Pulmonary:     Effort: Pulmonary effort is normal.  Abdominal:     General: There is no distension.     Palpations: Abdomen is soft.     Tenderness: There is no abdominal tenderness.     Comments: Incision healing well.  Musculoskeletal:        General: Normal range of motion.  Neurological:     Mental Status: She is alert and oriented to person, place, and time.     Cranial Nerves: No cranial nerve deficit.  Skin:    General: Skin is warm and dry.    Assessment: s/p :   [facility]  progressing well  Plan: Patient has done well after surgery with no apparent complications.  I have discussed the post-operative course to date, and the expected progress moving forward.  The patient understands what complications to be concerned about.  I will see the patient in routine follow up, or sooner if needed.    Activity plan: No restriction. Replens advised for vaginal dryness, twice weekly  Hoyt Koch 05/03/2021, 11:23 AM

## 2021-05-11 IMAGING — MG DIGITAL SCREENING BILAT W/ TOMO W/ CAD
8 series · 8 of 24 positions shown · non-contrast
Comparison: Previous exam(s).

CLINICAL DATA: Screening.

EXAM:
DIGITAL SCREENING BILATERAL MAMMOGRAM WITH TOMO AND CAD

[R MLO synth-2D]
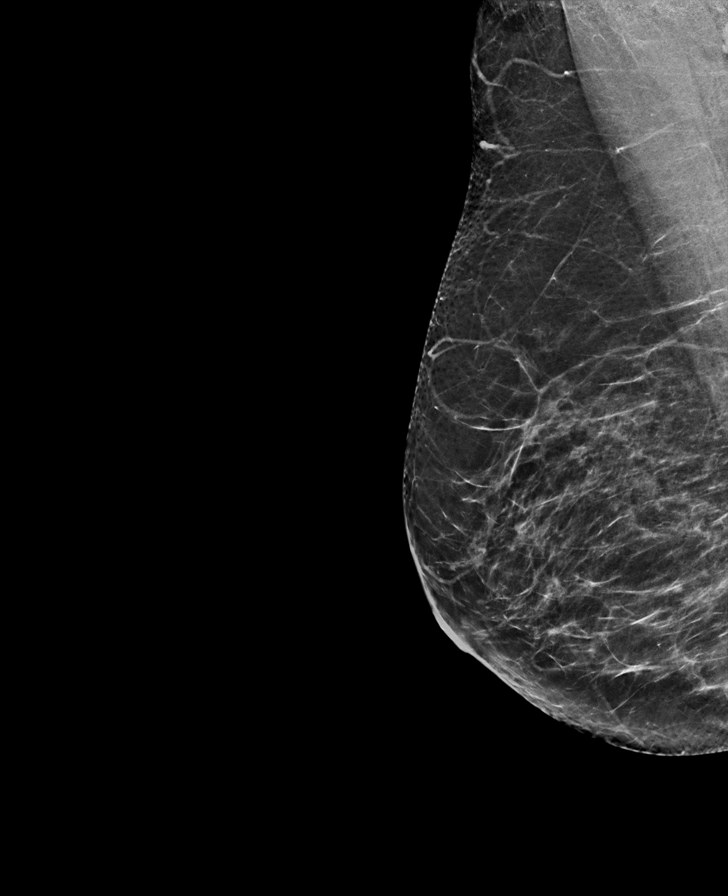

[L MLO synth-2D]
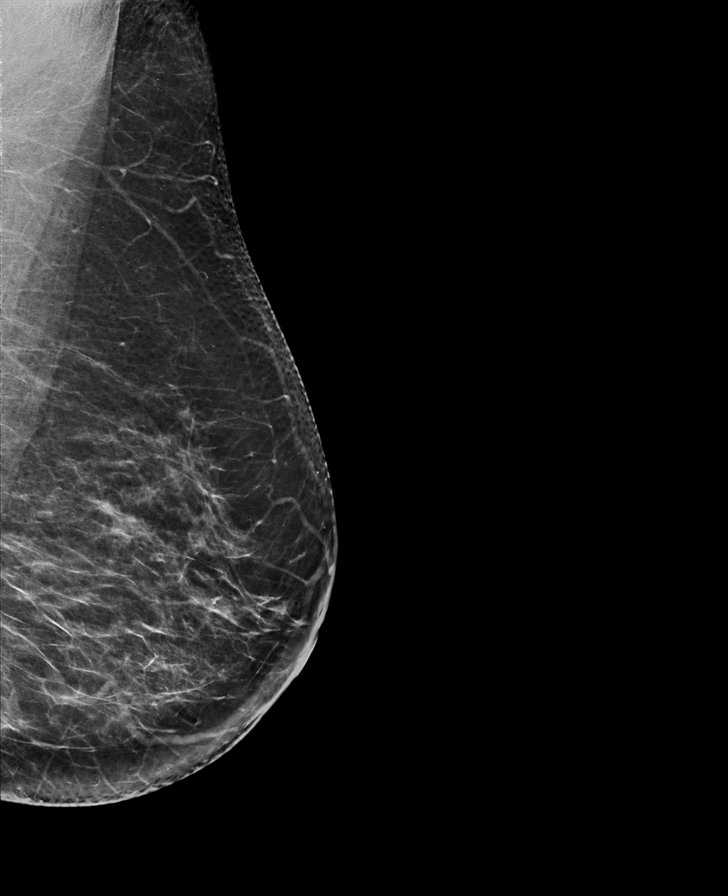

[L CC synth-2D]
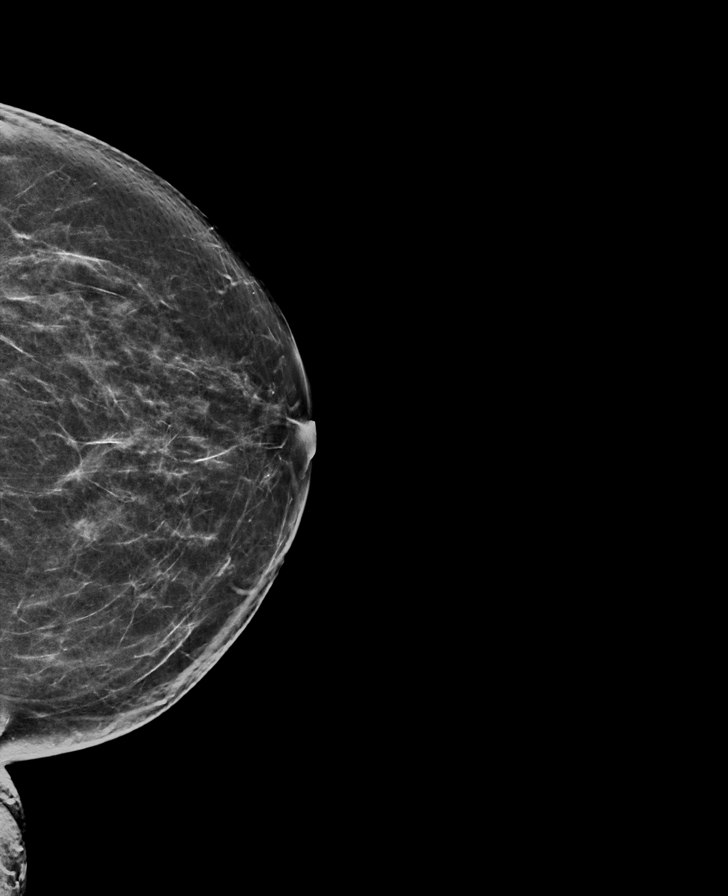

[R CC synth-2D]
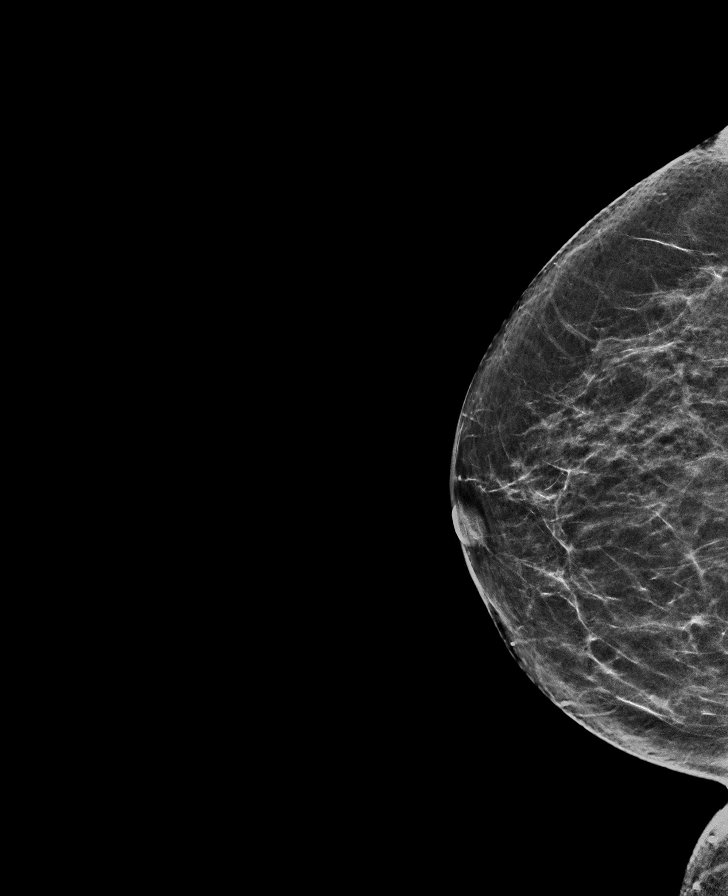

[R CC tomo · tomo slice 33/64.0]
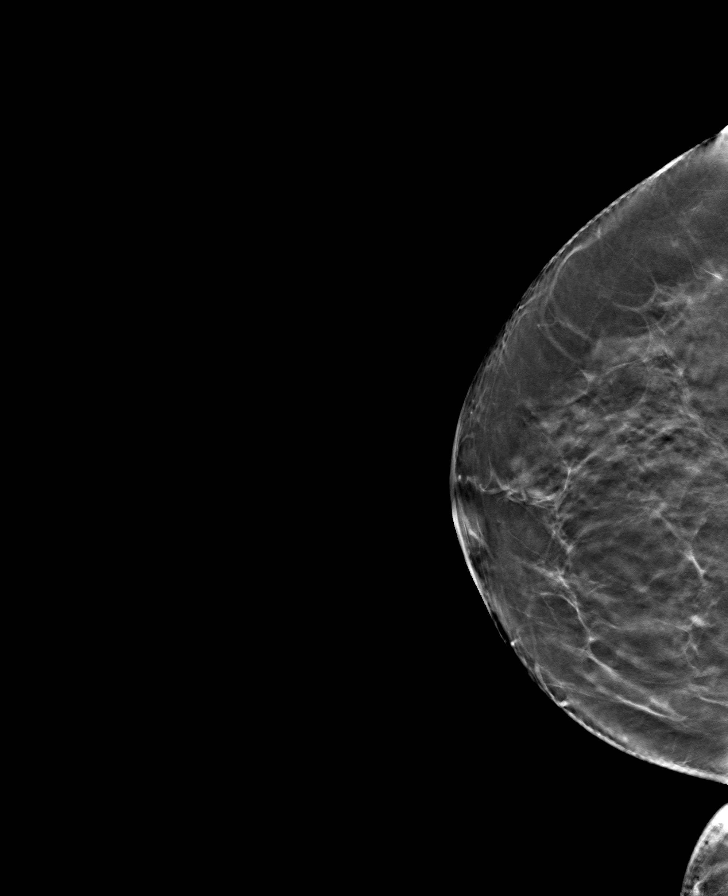

[R MLO tomo · tomo slice 35/68.0]
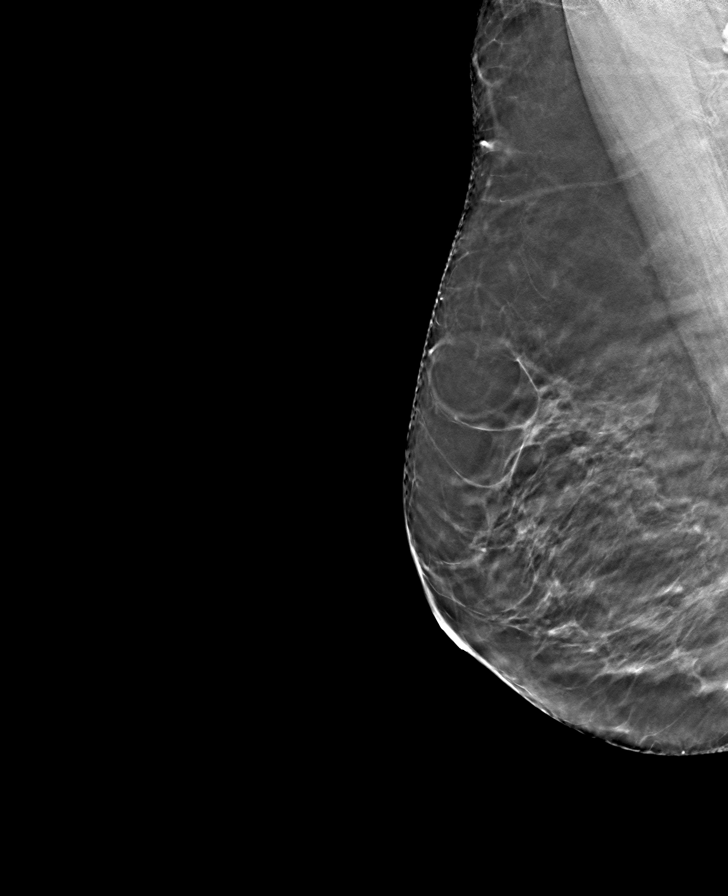

[L MLO tomo · tomo slice 38/75.0]
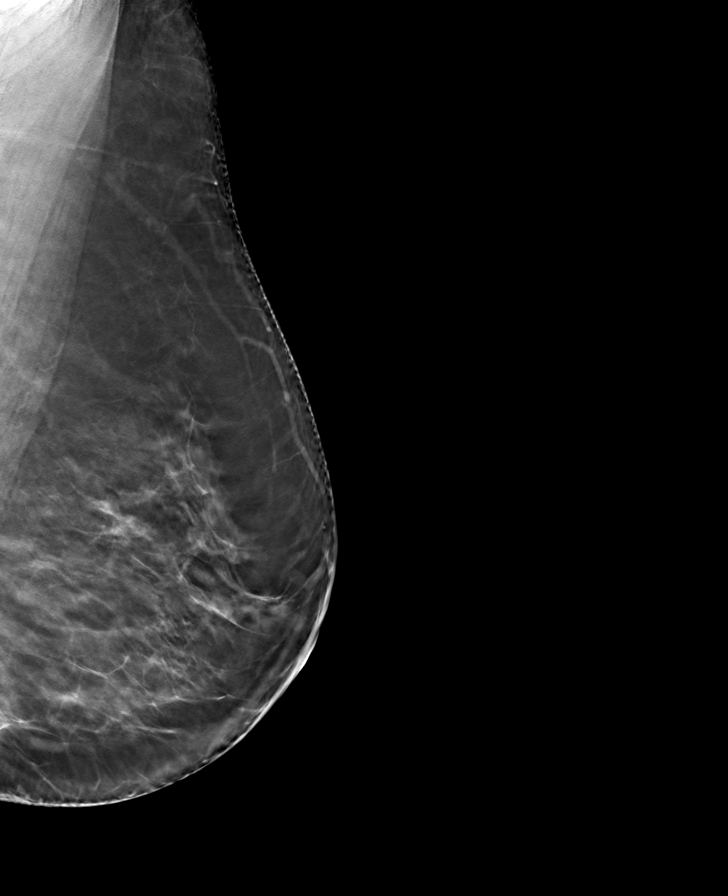

[L CC tomo · tomo slice 35/69.0]
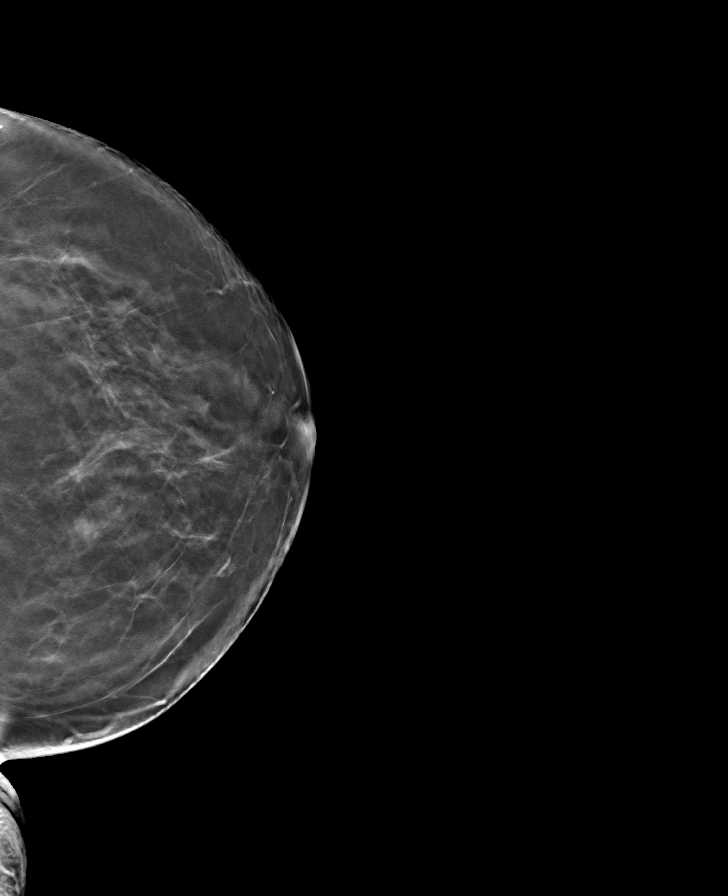

[8 of 24 positions shown; findings below may reference images not displayed]

ACR Breast Density Category b: There are scattered areas of
fibroglandular density.
FINDINGS: There are no findings suspicious for malignancy. Images were
processed with CAD.
IMPRESSION: No mammographic evidence of malignancy. A result letter of this
screening mammogram will be mailed directly to the patient.

RECOMMENDATION:
Screening mammogram in one year. (Code:[id number])

BI-RADS CATEGORY  1: Negative.

## 2021-06-20 ENCOUNTER — Other Ambulatory Visit: Payer: Self-pay | Admitting: Family Medicine

## 2021-06-20 DIAGNOSIS — Z1231 Encounter for screening mammogram for malignant neoplasm of breast: Secondary | ICD-10-CM

## 2021-06-30 ENCOUNTER — Encounter: Payer: Self-pay | Admitting: Obstetrics & Gynecology

## 2021-06-30 ENCOUNTER — Other Ambulatory Visit: Payer: Self-pay | Admitting: Obstetrics & Gynecology

## 2021-06-30 NOTE — Telephone Encounter (Signed)
Sch appt to discuss hormones or other meds for these sx's.  Tele appt ok.

## 2021-07-01 NOTE — Telephone Encounter (Signed)
Patient is scheduled for 07/05/21 with [facility]

## 2021-07-05 ENCOUNTER — Ambulatory Visit: Payer: BC Managed Care – PPO | Admitting: Obstetrics & Gynecology

## 2021-07-08 DIAGNOSIS — H2513 Age-related nuclear cataract, bilateral: Secondary | ICD-10-CM | POA: Diagnosis not present

## 2021-07-08 DIAGNOSIS — H524 Presbyopia: Secondary | ICD-10-CM | POA: Diagnosis not present

## 2021-07-08 DIAGNOSIS — I10 Essential (primary) hypertension: Secondary | ICD-10-CM | POA: Diagnosis not present

## 2021-07-08 DIAGNOSIS — H43391 Other vitreous opacities, right eye: Secondary | ICD-10-CM | POA: Diagnosis not present

## 2021-07-14 ENCOUNTER — Ambulatory Visit: Payer: PPO | Admitting: Family Medicine

## 2021-07-14 ENCOUNTER — Telehealth: Payer: Self-pay

## 2021-07-14 NOTE — Telephone Encounter (Signed)
Pt calling; had total hyst in Nov.; something is hanging out again; she thinks it's her bladder b/c it takes her so long to urinate.  [phone] ?

## 2021-07-14 NOTE — Telephone Encounter (Signed)
Pt is scheduled with [facility] at 8:35 on 3/3.  ?

## 2021-07-15 ENCOUNTER — Encounter: Payer: Self-pay | Admitting: Obstetrics and Gynecology

## 2021-07-15 ENCOUNTER — Ambulatory Visit (INDEPENDENT_AMBULATORY_CARE_PROVIDER_SITE_OTHER): Payer: PPO | Admitting: Obstetrics and Gynecology

## 2021-07-15 ENCOUNTER — Other Ambulatory Visit: Payer: Self-pay

## 2021-07-15 VITALS — BP 118/70 | Ht 66.0 in | Wt 158.2 lb

## 2021-07-15 DIAGNOSIS — N8110 Cystocele, unspecified: Secondary | ICD-10-CM | POA: Diagnosis not present

## 2021-07-15 NOTE — Progress Notes (Signed)
? ?Patient ID: Makayla [name], female   DOB: June 19, 1950, 71 y.o.   MRN: [id number] ? ?Reason for Consult: Gynecologic Exam ?  ?Referred by Valerie Roys, DO ? ?Subjective:  ?   ?HPI: ? ?Makayla [name] is a 71 y.o. female.  She is here today after calling for concerns of material protruding through her vagina.  She reports that she had a hysterectomy in November 2022 for prolapse.  However she has recently noticed that she has had a return of tissue prolapsing through the vagina. ? ?Gynecological History ? ?No LMP recorded. Patient is postmenopausal. ? ?Past Medical History:  ?Diagnosis Date  ? DDD (degenerative disc disease), lumbar   ? Family history of adverse reaction to anesthesia   ? GERD (gastroesophageal reflux disease)   ? Hypertension   ? Sciatica   ? Uterine prolapse   ? ?Family History  ?Problem Relation Age of Onset  ? Breast cancer Sister 64  ? Stroke Mother   ? Cancer Brother   ?     Melanoma- went to liver  ? Melanoma Brother   ? Liver cancer Brother   ? Liver cancer Brother   ? ?Past Surgical History:  ?Procedure Laterality Date  ? COLONOSCOPY    ? CYSTOSCOPY N/A 03/24/2021  ? Procedure: CYSTOSCOPY;  Surgeon: Gae Dry, MD;  Location: [facility] ORS;  Service: Gynecology;  Laterality: N/A;  ? OVARIAN CYST REMOVAL Left   ? TOTAL LAPAROSCOPIC HYSTERECTOMY WITH BILATERAL SALPINGO OOPHORECTOMY Bilateral 03/24/2021  ? Procedure: TOTAL LAPAROSCOPIC HYSTERECTOMY WITH BILATERAL SALPINGO OOPHORECTOMY;  Surgeon: Gae Dry, MD;  Location: [facility] ORS;  Service: Gynecology;  Laterality: Bilateral;  ? TUBAL LIGATION    ? X 2  ? ? ?Short Social History:  ?Social History  ? ?Tobacco Use  ? Smoking status: Former  ?  Packs/day: 0.50  ?  Years: 15.00  ?  Pack years: 7.50  ?  Types: Cigarettes  ?  Quit date: 03/23/2002  ?  Years since quitting: 19.3  ? Smokeless tobacco: Never  ?Substance Use Topics  ? Alcohol use: No  ? ? ?Allergies  ?Allergen Reactions  ? Crestor [Rosuvastatin Calcium] Other (See Comments)   ?  Headache  ? ? ?Current Outpatient Medications  ?Medication Sig Dispense Refill  ? acetaminophen (TYLENOL) 500 MG tablet Take 500-1,000 mg by mouth every 8 (eight) hours as needed for moderate pain.    ? atorvastatin (LIPITOR) 20 MG tablet Take 1 tablet (20 mg total) by mouth daily. (Patient taking differently: Take 20 mg by mouth every morning.) 90 tablet 1  ? ibuprofen (ADVIL,MOTRIN) 200 MG tablet Take 400 mg by mouth every 8 (eight) hours as needed for moderate pain.    ? lisinopril (ZESTRIL) 10 MG tablet Take 1 tablet (10 mg total) by mouth daily. (Patient taking differently: Take 10 mg by mouth every morning.) 90 tablet 1  ? Multiple Vitamins-Minerals (MULTIVITAMIN WITH MINERALS) tablet Take 1 tablet by mouth daily.    ? omeprazole (PRILOSEC) 20 MG capsule TAKE 1 CAPSULE BY MOUTH EVERY DAY (Patient taking differently: Take 20 mg by mouth daily as needed (acid reflux).) 90 capsule 1  ? simethicone (MYLICON) 782 MG chewable tablet Chew 125 mg by mouth every 6 (six) hours as needed for flatulence. (Patient not taking: Reported on 07/15/2021)    ? ?No current facility-administered medications for this visit.  ? ? ?Review of Systems  ?Constitutional: Negative for chills, fatigue, fever and unexpected weight change.  ?HENT: Negative for  trouble swallowing.  ?Eyes: Negative for loss of vision.  ?Respiratory: Negative for cough, shortness of breath and wheezing.  ?Cardiovascular: Negative for chest pain, leg swelling, palpitations and syncope.  ?GI: Negative for abdominal pain, blood in stool, diarrhea, nausea and vomiting.  ?GU: Negative for difficulty urinating, dysuria, frequency and hematuria.  ?Musculoskeletal: Negative for back pain, leg pain and joint pain.  ?Skin: Negative for rash.  ?Neurological: Negative for dizziness, headaches, light-headedness, numbness and seizures.  ?Psychiatric: Negative for behavioral problem, confusion, depressed mood and sleep disturbance.   ? ?   ?Objective:  ?Objective   ? ?Vitals:  ? 07/15/21 0830  ?BP: 118/70  ?Weight: 158 lb 3.2 oz (71.8 kg)  ?Height: 5\' 6"  (1.676 m)  ? ?Body mass index is 25.53 kg/m?. ? ?Physical Exam ?Vitals and nursing note reviewed. Exam conducted with a chaperone present.  ?Constitutional:   ?   Appearance: Normal appearance. She is well-developed.  ?HENT:  ?   Head: Normocephalic and atraumatic.  ?Eyes:  ?   Extraocular Movements: Extraocular movements intact.  ?   Pupils: Pupils are equal, round, and reactive to light.  ?Cardiovascular:  ?   Rate and Rhythm: Normal rate and regular rhythm.  ?Pulmonary:  ?   Effort: Pulmonary effort is normal. No respiratory distress.  ?   Breath sounds: Normal breath sounds.  ?Abdominal:  ?   General: Abdomen is flat.  ?   Palpations: Abdomen is soft.  ?Genitourinary: ?   Comments: External: Normal appearing vulva. No lesions noted.  ?Speculum examination: Normal appearing vaginal cuff.  ?Anterior vaginal wall prolapses to the level of the hymen. ?Musculoskeletal:     ?   General: No signs of injury.  ?Skin: ?   General: Skin is warm and dry.  ?Neurological:  ?   Mental Status: She is alert and oriented to person, place, and time.  ?Psychiatric:     ?   Behavior: Behavior normal.     ?   Thought Content: Thought content normal.     ?   Judgment: Judgment normal.  ? ? ?Assessment/Plan:  ?  ? 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  Patient is interested in being fitted with a pessary.  Will refer to urogynecology for fitting and management of pessary.   ?Provide the patient with [facility] handouts regarding prolapse Kegel exercises pessaries and vaginal estrogen use. ? ?More than 10 minutes were spent face to face with the patient in the room, reviewing the medical record, labs and images, and coordinating care for the patient. The plan of management was discussed in detail and counseling was provided.  ? ?  ?Adrian Prows MD ?[facility] ?07/15/2021 ?9:00 AM ? ? ?

## 2021-07-19 ENCOUNTER — Ambulatory Visit: Payer: Self-pay | Admitting: *Deleted

## 2021-07-19 NOTE — Telephone Encounter (Signed)
? ? ? ? ?  Chief Complaint: Back pain, ?Symptoms: Sudden right sided back pain, needed help to get out of car, to ambulate to house. 8/10 pain if any movement "CAn't walk without help." "I'm afraid my prolapsed bladder is hitting a nerve."  ?Frequency: An hour before call. ?Pertinent Negatives: Patient denies Numbness, does not radiate ?Disposition: '[x]'$ ED /'[]'$ Urgent Care (no appt availability in office) / '[]'$ Appointment(In office/virtual)/ '[]'$  [facility]/ '[]'$ Home Care/ '[]'$ Refused Recommended Disposition /'[]'$ [facility]/ '[]'$  Follow-up with PCP ?Additional Notes: Pt states she has been to [facility] and wishes to try there. Advised may be sent to ED. Pt verbalizes understanding.  ? Reason for Disposition ? [1] SEVERE back pain (e.g., excruciating) AND [2] sudden onset AND [3] age > 19 years ? ?Answer Assessment - Initial Assessment Questions ?1. ONSET: "When did the pain begin?"  ?    An hour before call ?2. LOCATION: "Where does it hurt?" (upper, mid or lower back) ?    Lower right side of back ?3. SEVERITY: "How bad is the pain?"  (e.g., Scale 1-10; mild, moderate, or severe) ?  - MILD (1-3): doesn't interfere with normal activities  ?  - MODERATE (4-7): interferes with normal activities or awakens from sleep  ?  - SEVERE (8-10): excruciating pain, unable to do any normal activities  ?    8/10 ?4. PATTERN: "Is the pain constant?" (e.g., yes, no; constant, intermittent)  ?    constant ?5. RADIATION: "Does the pain shoot into your legs or elsewhere?" ?    No ?6. CAUSE:  "What do you think is causing the back pain?"  ?    Unsure ?7. BACK OVERUSE:  "Any recent lifting of heavy objects, strenuous work or exercise?" ?    No ?8. MEDICATIONS: "What have you taken so far for the pain?" (e.g., nothing, acetaminophen, NSAIDS) ?     ?9. NEUROLOGIC SYMPTOMS: "Do you have any weakness, numbness, or problems with bowel/bladder control?" ?     ?10. OTHER SYMPTOMS: "Do you have any other symptoms?" (e.g., fever,  abdominal pain, burning with urination, blood in urine "Pain there for 3 weeks, not like this." ? ?Protocols used: Back Pain-A-AH ? ?

## 2021-08-02 ENCOUNTER — Ambulatory Visit (INDEPENDENT_AMBULATORY_CARE_PROVIDER_SITE_OTHER): Payer: PPO | Admitting: Obstetrics & Gynecology

## 2021-08-02 ENCOUNTER — Other Ambulatory Visit: Payer: Self-pay

## 2021-08-02 ENCOUNTER — Encounter: Payer: Self-pay | Admitting: Obstetrics & Gynecology

## 2021-08-02 VITALS — Ht 66.5 in | Wt 156.0 lb

## 2021-08-02 DIAGNOSIS — Z780 Asymptomatic menopausal state: Secondary | ICD-10-CM

## 2021-08-02 DIAGNOSIS — M545 Low back pain, unspecified: Secondary | ICD-10-CM | POA: Diagnosis not present

## 2021-08-02 MED ORDER — ESTRADIOL 0.05 MG/24HR TD PTWK
0.0500 mg | MEDICATED_PATCH | TRANSDERMAL | 11 refills | Status: DC
Start: 2021-08-02 — End: 2021-09-30

## 2021-08-02 NOTE — Progress Notes (Signed)
Virtual Visit via Telephone Note ? ?I connected with Makayla [name] on 08/02/21 at 11:15 AM EDT by telephone and verified that I am speaking with the correct person using two identifiers. ? ?Location: ?Patient: Home ?Provider: Office ?  ?I discussed the limitations, risks, security and privacy concerns of performing an evaluation and management service by telephone and the availability of in person appointments. I also discussed with the patient that there may be a patient responsible charge related to this service. The patient expressed understanding and agreed to proceed. ? ? ?History of Present Illness: ?     [name] is a 71 y.o. [phone] who is postmenopausal, presents today for a problem visit.  She complains of night sweats mostly, that has worsened since [facility] in Dec 2022 (done for prolapse).   Symptoms have been present for  2-3  months. Symptoms are mod to severe and has led her to come in today to seek options for intervention.  She also continues to feel her cystocele at vaginal introitus. ? ?Previous Treatment: none ? ?PMHx: ?She  has a past medical history of DDD (degenerative disc disease), lumbar, Family history of adverse reaction to anesthesia, GERD (gastroesophageal reflux disease), Hypertension, Sciatica, and Uterine prolapse. Also,  has a past surgical history that includes Tubal ligation; Ovarian cyst removal (Left); Colonoscopy; Total laparoscopic hysterectomy with bilateral salpingo oophorectomy (Bilateral, 03/24/2021); and Cystoscopy (N/A, 03/24/2021)., family history includes Breast cancer (age of onset: 75) in her sister; Cancer in her brother; Liver cancer in her brother and brother; Melanoma in her brother; Stroke in her mother.,  reports that she quit smoking about 19 years ago. Her smoking use included cigarettes. She has a 7.50 pack-year smoking history. She has never used smokeless tobacco. She reports that she does not drink alcohol and does not use drugs. ? ?She has  a current medication list which includes the following prescription(s): acetaminophen, atorvastatin, estradiol, ibuprofen, lisinopril, multivitamin with minerals, omeprazole, and simethicone. Also, is allergic to crestor [rosuvastatin calcium]. ? ?Review of Systems  ?All other systems reviewed and are negative. ? ?Observations/Objective: ?No exam today, due to telephone eVisit due to [facility] virus restriction on elective visits and procedures.  Prior visits reviewed along with ultrasounds/labs as indicated. ? ?Assessment and Plan: ?  ICD-10-CM   ?1. Menopause  Z78.0   ?  ?HRT ?I have discussed HRT with the patient in detail.  The risk/benefits of it were reviewed.  She understands that during menopause Estrogen decreases dramatically and that this results in an increased risk of cardiovascular disease as well as osteoporosis.  We have also discussed the fact that hot flashes often result from a decrease in Estrogen, and that by replacing Estrogen, they can often be alleviated.  We have discussed skin, vaginal and urinary tract changes that may also take place from this drop in Estrogen.  Emotional changes have also been linked to Estrogen and we have briefly discussed this.  The benefits of HRT including decrease in hot flashes, vaginal dryness, and osteoporosis were discussed.  The emotional benefit and a possible change in her cardiovascular risk profile was also reviewed.  The risks associated with Hormone Replacement Therapy were also reviewed.  The use of unopposed Estrogen and its relationship to endometrial cancer was discussed.  The addition of Progesterone and its beneficial effect on endometrial cancer was also noted.  The fact that there has been no consistent definitive studies showing an increase in breast cancer in women who use  HRT was discussed with the patient.  The possible side effects including breast tenderness, fluid retention, mood changes and vaginal bleeding were discussed.  The patient was  informed that this is an elective medication and that she may choose not to take Hormone Replacement Therapy.  Literature on HRT was given, and I believe that after answering all of the patient?s questions, she has an adequate and informed understanding of HRT.  Special emphasis on the WHI study, as well as several studies since that pertaining to the risks and benefits of estrogen replacement therapy were compared.  The possible limitations of these studies were discussed including the age stratification of the WHI study.  The possible role of Progesterone in these studies was discussed in detail.  I believe that the patient has an informed knowledge of the risks and benefits of HRT.  I have specifically discussed WHI findings and current updates.  Different type of hormone formulation and methods of taking hormone replacement therapy discussed.  ? ?Plan Climara weekly patch as start for ERT plans to assist with night sweats. ? Pt to monitor sx's related to cystocele.  Plans to see UroGyn. ? ? ?Follow Up Instructions: ?2 mos ?  ?I discussed the assessment and treatment plan with the patient. The patient was provided an opportunity to ask questions and all were answered. The patient agreed with the plan and demonstrated an understanding of the instructions. ?  ?The patient was advised to call back or seek an in-person evaluation if the symptoms worsen or if the condition fails to improve as anticipated. ? ?I provided 15 minutes of non-face-to-face time during this encounter. ? ? ?Hoyt Koch, MD ? ? ?

## 2021-08-09 ENCOUNTER — Ambulatory Visit
Admission: RE | Admit: 2021-08-09 | Discharge: 2021-08-09 | Disposition: A | Discharge status: Home / Self Care | Payer: PPO | Source: Ambulatory Visit | Attending: Family Medicine | Admitting: Family Medicine

## 2021-08-09 ENCOUNTER — Other Ambulatory Visit: Payer: Self-pay

## 2021-08-09 DIAGNOSIS — Z1231 Encounter for screening mammogram for malignant neoplasm of breast: Secondary | ICD-10-CM | POA: Insufficient documentation

## 2021-09-02 ENCOUNTER — Encounter: Payer: Self-pay | Admitting: Family Medicine

## 2021-09-02 ENCOUNTER — Ambulatory Visit (INDEPENDENT_AMBULATORY_CARE_PROVIDER_SITE_OTHER): Payer: PPO | Admitting: Family Medicine

## 2021-09-02 VITALS — BP 120/82 | HR 58 | Temp 97.6°F | Ht 66.0 in | Wt 159.2 lb

## 2021-09-02 DIAGNOSIS — Z0000 Encounter for general adult medical examination without abnormal findings: Secondary | ICD-10-CM | POA: Diagnosis not present

## 2021-09-02 DIAGNOSIS — Z1211 Encounter for screening for malignant neoplasm of colon: Secondary | ICD-10-CM | POA: Diagnosis not present

## 2021-09-02 DIAGNOSIS — D692 Other nonthrombocytopenic purpura: Secondary | ICD-10-CM

## 2021-09-02 DIAGNOSIS — I129 Hypertensive chronic kidney disease with stage 1 through stage 4 chronic kidney disease, or unspecified chronic kidney disease: Secondary | ICD-10-CM

## 2021-09-02 DIAGNOSIS — E782 Mixed hyperlipidemia: Secondary | ICD-10-CM

## 2021-09-02 LAB — URINALYSIS, ROUTINE W REFLEX MICROSCOPIC
Bilirubin, UA: NEGATIVE
Glucose, UA: NEGATIVE
Ketones, UA: NEGATIVE
Leukocytes,UA: NEGATIVE
Nitrite, UA: NEGATIVE
Protein,UA: NEGATIVE
Specific Gravity, UA: 1.025 (ref 1.005–1.030)
Urobilinogen, Ur: 0.2 mg/dL (ref 0.2–1.0)
pH, UA: 6 (ref 5.0–7.5)

## 2021-09-02 LAB — MICROALBUMIN, URINE WAIVED
Creatinine, Urine Waived: 50 mg/dL (ref 10–300)
Microalb, Ur Waived: 10 mg/L (ref 0–19)
Microalb/Creat Ratio: <30 mg/g (ref <30)

## 2021-09-02 LAB — MICROSCOPIC EXAMINATION: WBC, UA: NONE SEEN /hpf (ref 0–5)

## 2021-09-02 MED ORDER — LISINOPRIL 10 MG PO TABS
10.0000 mg | ORAL_TABLET | Freq: Every day | ORAL | 1 refills | Status: DC
Start: 2021-09-02 — End: 2022-03-06

## 2021-09-02 MED ORDER — ATORVASTATIN CALCIUM 20 MG PO TABS: 20.0000 mg | ORAL_TABLET | Freq: Every day | ORAL | 1 refills | Status: DC

## 2021-09-02 MED ORDER — OMEPRAZOLE 20 MG PO CPDR: DELAYED_RELEASE_CAPSULE | ORAL | 1 refills | Status: DC

## 2021-09-02 NOTE — Assessment & Plan Note (Signed)
Under good control on current regimen. Continue current regimen. Continue to monitor. Call with any concerns. Refills given. Labs drawn today.   

## 2021-09-02 NOTE — Assessment & Plan Note (Signed)
Reassured patient. No concerns.  ?

## 2021-09-02 NOTE — Progress Notes (Signed)
? ?BP 120/82   Pulse (!) 58   Temp 97.6 ?F (36.4 ?C)   Ht '5\' 6"'$  (1.676 m)   Wt 159 lb 3.2 oz (72.2 kg)   SpO2 99%   BMI 25.70 kg/m?   ? ?Subjective:  ? ? Patient ID: Makayla [name], female    DOB: [birth year], 71 y.o.   MRN: [id number] ? ?HPI: ?Makayla [name] is a 72 y.o. female presenting on 09/02/2021 for comprehensive medical examination. Current medical complaints include: ? ?HYPERTENSION / HYPERLIPIDEMIA ?Satisfied with current treatment? yes ?Duration of hypertension: chronic ?BP monitoring frequency: not checking ?BP medication side effects: no ?Past BP meds: lisinopril ?Duration of hyperlipidemia: chronic ?Cholesterol medication side effects: no ?Cholesterol supplements: none ?Past cholesterol medications: atorvastatin ?Medication compliance: excellent compliance ?Aspirin: no ?Recent stressors: no ?Recurrent headaches: no ?Visual changes: no ?Palpitations: no ?Dyspnea: no ?Chest pain: no ?Lower extremity edema: no ?Dizzy/lightheaded: no ? ?She currently lives with: husband ?Menopausal Symptoms: yes ? ?Depression Screen done today and results listed below:  ? ?  09/02/2021  ?  8:14 AM 11/19/2020  ? 10:32 AM 09/01/2020  ?  8:07 AM 08/25/2019  ?  9:04 AM 07/03/2018  ?  2:32 PM  ?Depression screen PHQ 2/9  ?Decreased Interest 0 0 0 0 0  ?Down, Depressed, Hopeless 0 0 0 0 0  ?PHQ - 2 Score 0 0 0 0 0  ?Altered sleeping 0      ?Tired, decreased energy 1      ?Change in appetite 0      ?Feeling bad or failure about yourself  0      ?Trouble concentrating 0      ?Moving slowly or fidgety/restless 0      ?Suicidal thoughts 0      ?PHQ-9 Score 1      ? ? ?Past Medical History:  ?Past Medical History:  ?Diagnosis Date  ? DDD (degenerative disc disease), lumbar   ? Family history of adverse reaction to anesthesia   ? GERD (gastroesophageal reflux disease)   ? Hypertension   ? Sciatica   ? Uterine prolapse   ? ? ?Surgical History:  ?Past Surgical History:  ?Procedure Laterality Date  ? COLONOSCOPY    ? CYSTOSCOPY N/A  03/24/2021  ? Procedure: CYSTOSCOPY;  Surgeon: Gae Dry, MD;  Location: [facility];  Service: Gynecology;  Laterality: N/A;  ? OOPHORECTOMY    ? OVARIAN CYST REMOVAL Left   ? TOTAL ABDOMINAL HYSTERECTOMY W/ BILATERAL SALPINGOOPHORECTOMY    ? TOTAL LAPAROSCOPIC HYSTERECTOMY WITH BILATERAL SALPINGO OOPHORECTOMY Bilateral 03/24/2021  ? Procedure: TOTAL LAPAROSCOPIC HYSTERECTOMY WITH BILATERAL SALPINGO OOPHORECTOMY;  Surgeon: Gae Dry, MD;  Location: [facility];  Service: Gynecology;  Laterality: Bilateral;  ? TUBAL LIGATION    ? X 2  ? ? ?Medications:  ?Current Outpatient Medications on File Prior to Visit  ?Medication Sig  ? acetaminophen (TYLENOL) 500 MG tablet Take 500-1,000 mg by mouth every 8 (eight) hours as needed for moderate pain.  ? estradiol (CLIMARA - DOSED IN MG/24 HR) 0.05 mg/24hr patch Place 1 patch (0.05 mg total) onto the skin once a week.  ? ibuprofen (ADVIL,MOTRIN) 200 MG tablet Take 400 mg by mouth every 8 (eight) hours as needed for moderate pain.  ? Multiple Vitamins-Minerals (MULTIVITAMIN WITH MINERALS) tablet Take 1 tablet by mouth daily.  ? ?No current facility-administered medications on file prior to visit.  ? ? ?Allergies:  ?Allergies  ?Allergen Reactions  ? Crestor [Rosuvastatin Calcium] Other (See Comments)  ?  Headache  ? ? ?Social History:  ?Social History  ? ?Socioeconomic History  ? Marital status: Married  ?  Spouse name: Not on file  ? Number of children: Not on file  ? Years of education: Not on file  ? Highest education level: High school graduate  ?Occupational History  ? Occupation: retired  ?Tobacco Use  ? Smoking status: Former  ?  Packs/day: 0.50  ?  Years: 15.00  ?  Pack years: 7.50  ?  Types: Cigarettes  ?  Quit date: 03/23/2002  ?  Years since quitting: 19.4  ? Smokeless tobacco: Never  ?Vaping Use  ? Vaping Use: Never used  ?Substance and Sexual Activity  ? Alcohol use: No  ? Drug use: No  ? Sexual activity: Not Currently  ?Other Topics Concern  ? Not on file   ?Social History Narrative  ? 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 ?Social Determinants of Health  ? ?Financial Resource Strain: Low Risk   ? Difficulty of Paying Living Expenses: Not hard at all  ?Food Insecurity: No Food Insecurity  ? Worried About Charity fundraiser in the Last Year: Never true  ? Ran Out of Food in the Last Year: Never true  ?Transportation Needs: No Transportation Needs  ? Lack of Transportation (Medical): No  ? Lack of Transportation (Non-Medical): No  ?Physical Activity: Sufficiently Active  ? Days of Exercise per Week: 6 days  ? Minutes of Exercise per Session: 30 min  ?Stress: No Stress Concern Present  ? Feeling of Stress : Not at all  ?Social Connections: Not on file  ?Intimate Partner Violence: Not on file  ? ?Social History  ? ?Tobacco Use  ?Smoking Status Former  ? Packs/day: 0.50  ? Years: 15.00  ? Pack years: 7.50  ? Types: Cigarettes  ? Quit date: 03/23/2002  ? Years since quitting: 19.4  ?Smokeless Tobacco Never  ? ?Social History  ? ?Substance and Sexual Activity  ?Alcohol Use No  ? ? ?Family History:  ?Family History  ?Problem Relation Age of Onset  ? Breast cancer Sister 38  ? Stroke Mother   ? Cancer Brother   ?     Melanoma- went to liver  ? Melanoma Brother   ? Liver cancer Brother   ? Liver cancer Brother   ? ? ?Past medical history, surgical history, medications, allergies, family history and social history reviewed with patient today and changes made to appropriate areas of the chart.  ? ?Review of Systems  ?Constitutional: Negative.   ?HENT: Negative.    ?Eyes: Negative.   ?Respiratory: Negative.    ?Cardiovascular: Negative.   ?Gastrointestinal:  Positive for constipation. Negative for abdominal pain, blood in stool, diarrhea, heartburn, melena, nausea and vomiting.  ?Genitourinary: Negative.   ?Musculoskeletal: Negative.   ?Skin: Negative.   ?Neurological:  Positive for dizziness (couple of times a week). Negative for tingling, tremors, sensory change, speech change, focal  weakness, seizures, loss of consciousness, weakness and headaches.  ?Endo/Heme/Allergies:  Negative for environmental allergies and polydipsia. Bruises/bleeds easily.  ?Psychiatric/Behavioral: Negative.    ?All other ROS negative except what is listed above and in the HPI.  ? ?   ?Objective:  ?  ?BP 120/82   Pulse (!) 58   Temp 97.6 ?F (36.4 ?C)   Ht '5\' 6"'$  (1.676 m)   Wt 159 lb 3.2 oz (72.2 kg)   SpO2 99%   BMI 25.70 kg/m?   ?Wt Readings from Last 3 Encounters:  ?  09/02/21 159 lb 3.2 oz (72.2 kg)  ?08/02/21 156 lb (70.8 kg)  ?07/15/21 158 lb 3.2 oz (71.8 kg)  ?  ?Physical Exam ?Vitals and nursing note reviewed.  ?Constitutional:   ?   General: She is not in acute distress. ?   Appearance: Normal appearance. She is not ill-appearing, toxic-appearing or diaphoretic.  ?HENT:  ?   Head: Normocephalic and atraumatic.  ?   Right Ear: Tympanic membrane, ear canal and external ear normal. There is no impacted cerumen.  ?   Left Ear: Tympanic membrane, ear canal and external ear normal. There is no impacted cerumen.  ?   Nose: Nose normal. No congestion or rhinorrhea.  ?   Mouth/Throat:  ?   Mouth: Mucous membranes are moist.  ?   Pharynx: Oropharynx is clear. No oropharyngeal exudate or posterior oropharyngeal erythema.  ?Eyes:  ?   General: No scleral icterus.    ?   Right eye: No discharge.     ?   Left eye: No discharge.  ?   Extraocular Movements: Extraocular movements intact.  ?   Conjunctiva/sclera: Conjunctivae normal.  ?   Pupils: Pupils are equal, round, and reactive to light.  ?Neck:  ?   Vascular: No carotid bruit.  ?Cardiovascular:  ?   Rate and Rhythm: Normal rate and regular rhythm.  ?   Pulses: Normal pulses.  ?   Heart sounds: No murmur heard. ?  No friction rub. No gallop.  ?Pulmonary:  ?   Effort: Pulmonary effort is normal. No respiratory distress.  ?   Breath sounds: Normal breath sounds. No stridor. No wheezing, rhonchi or rales.  ?Chest:  ?   Chest wall: No tenderness.  ?Abdominal:  ?   General:  Abdomen is flat. Bowel sounds are normal. There is no distension.  ?   Palpations: Abdomen is soft. There is no mass.  ?   Tenderness: There is no abdominal tenderness. There is no right CVA tenderness, left CVA tenderness, g

## 2021-09-03 LAB — COMPREHENSIVE METABOLIC PANEL
ALT: 12 IU/L (ref 0–32)
AST: 17 IU/L (ref 0–40)
Albumin/Globulin Ratio: 3 — ABNORMAL HIGH (ref 1.2–2.2)
Albumin: 4.5 g/dL (ref 3.8–4.8)
Alkaline Phosphatase: 93 IU/L (ref 44–121)
BUN/Creatinine Ratio: 25 (ref 12–28)
BUN: 20 mg/dL (ref 8–27)
Bilirubin Total: 0.3 mg/dL (ref 0.0–1.2)
CO2: 25 mmol/L (ref 20–29)
Calcium: 9.4 mg/dL (ref 8.7–10.3)
Chloride: 106 mmol/L (ref 96–106)
Creatinine, Ser: 0.8 mg/dL (ref 0.57–1.00)
Globulin, Total: 1.5 g/dL (ref 1.5–4.5)
Glucose: 90 mg/dL (ref 70–99)
Potassium: 4.3 mmol/L (ref 3.5–5.2)
Sodium: 143 mmol/L (ref 134–144)
Total Protein: 6 g/dL (ref 6.0–8.5)
eGFR: 80 mL/min/{1.73_m2} (ref >59)

## 2021-09-03 LAB — LIPID PANEL W/O CHOL/HDL RATIO
Cholesterol, Total: 191 mg/dL (ref 100–199)
HDL: 83 mg/dL (ref >39)
LDL Chol Calc (NIH): 89 mg/dL (ref 0–99)
Triglycerides: 112 mg/dL (ref 0–149)
VLDL Cholesterol Cal: 19 mg/dL (ref 5–40)

## 2021-09-03 LAB — CBC WITH DIFFERENTIAL/PLATELET
Basophils Absolute: 0.1 10*3/uL (ref 0.0–0.2)
Basos: 1 %
EOS (ABSOLUTE): 0.1 10*3/uL (ref 0.0–0.4)
Eos: 1 %
Hematocrit: 40.7 % (ref 34.0–46.6)
Hemoglobin: 12.8 g/dL (ref 11.1–15.9)
Immature Grans (Abs): 0 10*3/uL (ref 0.0–0.1)
Immature Granulocytes: 0 %
Lymphocytes Absolute: 2 10*3/uL (ref 0.7–3.1)
Lymphs: 27 %
MCH: 31 pg (ref 26.6–33.0)
MCHC: 31.4 g/dL — ABNORMAL LOW (ref 31.5–35.7)
MCV: 99 fL — ABNORMAL HIGH (ref 79–97)
Monocytes Absolute: 0.5 10*3/uL (ref 0.1–0.9)
Monocytes: 7 %
Neutrophils Absolute: 4.6 10*3/uL (ref 1.4–7.0)
Neutrophils: 64 %
Platelets: 246 10*3/uL (ref 150–450)
RBC: 4.13 x10E6/uL (ref 3.77–5.28)
RDW: 12.5 % (ref 11.7–15.4)
WBC: 7.2 10*3/uL (ref 3.4–10.8)

## 2021-09-03 LAB — TSH: TSH: 1.42 u[IU]/mL (ref 0.450–4.500)

## 2021-09-06 ENCOUNTER — Encounter: Payer: Self-pay | Admitting: Family Medicine

## 2021-09-09 DIAGNOSIS — Z1211 Encounter for screening for malignant neoplasm of colon: Secondary | ICD-10-CM | POA: Diagnosis not present

## 2021-09-20 LAB — COLOGUARD: COLOGUARD: NEGATIVE

## 2021-09-29 ENCOUNTER — Ambulatory Visit: Payer: Self-pay | Admitting: *Deleted

## 2021-09-29 NOTE — Telephone Encounter (Signed)
I returned her call.   C/o vaginal burning for 2 days.   Reason for Disposition  [1] MILD-MODERATE pain AND [2] present > 24 hours (Exception: chronic pain)  Answer Assessment - Initial Assessment Questions 1. SYMPTOM: "What's the main symptom you're concerned about?" (e.g., pain, itching, dryness)     Vaginal burning for 2 days.   I have a prolapsed bladder from a hysterectomy.    It's very uncomfortable.   I'm burning down there all the time.   I'm walking with my legs open it's so sore.   I have a little discharge and odor.    2. LOCATION: "Where is the  burning located?" (e.g., inside/outside, left/right)     Around the vaginal 3. ONSET: "When did the  burning  start?"     Today really bad.  Started yesterday.   Urinating frequently yesterday and today.   My bladder was prolapsed so pushed it back up in me.    4. PAIN: "Is there any pain?" If Yes, ask: "How bad is it?" (Scale: 1-10; mild, moderate, severe)     Yes burning 5. ITCHING: "Is there any itching?" If Yes, ask: "How bad is it?" (Scale: 1-10; mild, moderate, severe)     No 6. CAUSE: "What do you think is causing the discharge?" "Have you had the same problem before? What happened then?"     *No Answer* 7. OTHER SYMPTOMS: "Do you have any other symptoms?" (e.g., fever, itching, vaginal bleeding, pain with urination, injury to genital area, vaginal foreign body)     *No Answer* 8. PREGNANCY: "Is there any chance you are pregnant?" "When was your last menstrual period?"     *No Answer*  Protocols used: Vaginal Symptoms-A-AH

## 2021-09-29 NOTE — Telephone Encounter (Signed)
  Chief Complaint: vaginal burning, urinary frequency, urgency but hard to get stream started, bladder is prolapsed, vaginal discharge with odor. Symptoms: above Frequency: Started Wed. With burning  Pertinent Negatives: Patient denies burning with urination. Disposition: '[]'$ ED /'[]'$ Urgent Care (no appt availability in office) / '[x]'$ Appointment(In office/virtual)/ '[]'$  [facility]/ '[]'$ Home Care/ '[]'$ Refused Recommended Disposition /'[]'$ [facility]/ '[]'$  Follow-up with PCP Additional Notes: Appt made with Dr. Wynetta Emery for 09/30/2021 at 8:40.

## 2021-09-30 ENCOUNTER — Encounter: Payer: Self-pay | Admitting: Family Medicine

## 2021-09-30 ENCOUNTER — Ambulatory Visit (INDEPENDENT_AMBULATORY_CARE_PROVIDER_SITE_OTHER): Payer: PPO | Admitting: Family Medicine

## 2021-09-30 VITALS — BP 108/72 | HR 61 | Temp 97.2°F | Wt 159.6 lb

## 2021-09-30 DIAGNOSIS — N3001 Acute cystitis with hematuria: Secondary | ICD-10-CM | POA: Diagnosis not present

## 2021-09-30 DIAGNOSIS — R300 Dysuria: Secondary | ICD-10-CM | POA: Diagnosis not present

## 2021-09-30 LAB — URINALYSIS, ROUTINE W REFLEX MICROSCOPIC
Bilirubin, UA: NEGATIVE
Glucose, UA: NEGATIVE
Ketones, UA: NEGATIVE
Nitrite, UA: POSITIVE — AB
Protein,UA: NEGATIVE
Specific Gravity, UA: 1.015 (ref 1.005–1.030)
Urobilinogen, Ur: 0.2 mg/dL (ref 0.2–1.0)
pH, UA: 6 (ref 5.0–7.5)

## 2021-09-30 LAB — WET PREP FOR TRICH, YEAST, CLUE
Clue Cell Exam: NEGATIVE
Trichomonas Exam: NEGATIVE
Yeast Exam: NEGATIVE

## 2021-09-30 LAB — MICROSCOPIC EXAMINATION

## 2021-09-30 MED ORDER — CIPROFLOXACIN HCL 500 MG PO TABS: 500.0000 mg | ORAL_TABLET | Freq: Two times a day (BID) | ORAL | 0 refills | Status: DC

## 2021-09-30 NOTE — Progress Notes (Signed)
BP 108/72   Pulse 61   Temp (!) 97.2 F (36.2 C)   Wt 159 lb 9.6 oz (72.4 kg)   SpO2 99%   BMI 25.76 kg/m    Subjective:    Patient ID: Makayla [name], female    DOB: [birth year], 71 y.o.   MRN: [id number]  HPI: Makayla [name] is a 71 y.o. female  Chief Complaint  Patient presents with   Urinary Tract Infection    Patient states symptoms began 3 days ago, has burning with urination and pain in her vagina. Patient has noticed dark, cloudy urine. Patient had some discharge yesterday.    URINARY SYMPTOMS Duration: 3-4 days Dysuria: yes Urinary frequency: yes Urgency: yes Small volume voids: yes Symptom severity: moderate Urinary incontinence: no Foul odor: yes Hematuria: no Abdominal pain: no Back pain: no Suprapubic pain/pressure: yes Flank pain: no Fever:  no Vomiting: no Relief with cranberry juice: no Relief with pyridium: no Status: worse Previous urinary tract infection: yes Recurrent urinary tract infection: no History of sexually transmitted disease: no Vaginal discharge: yes Treatments attempted: increasing fluids    Relevant past medical, surgical, family and social history reviewed and updated as indicated. Interim medical history since our last visit reviewed. Allergies and medications reviewed and updated.  Review of Systems  Constitutional: Negative.   Respiratory: Negative.    Cardiovascular: Negative.   Gastrointestinal: Negative.   Genitourinary:  Positive for dysuria, frequency and urgency. Negative for decreased urine volume, difficulty urinating, dyspareunia, enuresis, flank pain, genital sores, hematuria, menstrual problem, pelvic pain, vaginal bleeding, vaginal discharge and vaginal pain.  Musculoskeletal:  Positive for back pain. Negative for arthralgias, gait problem, joint swelling, myalgias, neck pain and neck stiffness.  Skin: Negative.   Neurological: Negative.   Psychiatric/Behavioral: Negative.     Per HPI unless  specifically indicated above     Objective:    BP 108/72   Pulse 61   Temp (!) 97.2 F (36.2 C)   Wt 159 lb 9.6 oz (72.4 kg)   SpO2 99%   BMI 25.76 kg/m   Wt Readings from Last 3 Encounters:  09/30/21 159 lb 9.6 oz (72.4 kg)  09/02/21 159 lb 3.2 oz (72.2 kg)  08/02/21 156 lb (70.8 kg)    Physical Exam Vitals and nursing note reviewed.  Constitutional:      General: She is not in acute distress.    Appearance: Normal appearance. She is not ill-appearing, toxic-appearing or diaphoretic.  HENT:     Head: Normocephalic and atraumatic.     Right Ear: External ear normal.     Left Ear: External ear normal.     Nose: Nose normal.     Mouth/Throat:     Mouth: Mucous membranes are moist.     Pharynx: Oropharynx is clear.  Eyes:     General: No scleral icterus.       Right eye: No discharge.        Left eye: No discharge.     Extraocular Movements: Extraocular movements intact.     Conjunctiva/sclera: Conjunctivae normal.     Pupils: Pupils are equal, round, and reactive to light.  Cardiovascular:     Rate and Rhythm: Normal rate and regular rhythm.     Pulses: Normal pulses.     Heart sounds: Normal heart sounds. No murmur heard.   No friction rub. No gallop.  Pulmonary:     Effort: Pulmonary effort is normal. No respiratory distress.     Breath  sounds: Normal breath sounds. No stridor. No wheezing, rhonchi or rales.  Chest:     Chest wall: No tenderness.  Musculoskeletal:        General: Normal range of motion.     Cervical back: Normal range of motion and neck supple.  Skin:    General: Skin is warm and dry.     Capillary Refill: Capillary refill takes less than 2 seconds.     Coloration: Skin is not jaundiced or pale.     Findings: No bruising, erythema, lesion or rash.  Neurological:     General: No focal deficit present.     Mental Status: She is alert and oriented to person, place, and time. Mental status is at baseline.  Psychiatric:        Mood and Affect:  Mood normal.        Behavior: Behavior normal.        Thought Content: Thought content normal.        Judgment: Judgment normal.    Results for orders placed or performed in visit on 09/02/21  Microscopic Examination   Urine  Result Value Ref Range   WBC, UA None seen 0 - 5 /hpf   RBC 0-2 0 - 2 /hpf   Epithelial Cells (non renal) 0-10 0 - 10 /hpf   Mucus, UA Present (A) Not Estab.   Bacteria, UA Few (A) None seen/Few  CBC with Differential/Platelet  Result Value Ref Range   WBC 7.2 3.4 - 10.8 x10E3/uL   RBC 4.13 3.77 - 5.28 x10E6/uL   Hemoglobin 12.8 11.1 - 15.9 g/dL   Hematocrit 40.7 34.0 - 46.6 %   MCV 99 (H) 79 - 97 fL   MCH 31.0 26.6 - 33.0 pg   MCHC 31.4 (L) 31.5 - 35.7 g/dL   RDW 12.5 11.7 - 15.4 %   Platelets 246 150 - 450 x10E3/uL   Neutrophils 64 Not Estab. %   Lymphs 27 Not Estab. %   Monocytes 7 Not Estab. %   Eos 1 Not Estab. %   Basos 1 Not Estab. %   Neutrophils Absolute 4.6 1.4 - 7.0 x10E3/uL   Lymphocytes Absolute 2.0 0.7 - 3.1 x10E3/uL   Monocytes Absolute 0.5 0.1 - 0.9 x10E3/uL   EOS (ABSOLUTE) 0.1 0.0 - 0.4 x10E3/uL   Basophils Absolute 0.1 0.0 - 0.2 x10E3/uL   Immature Granulocytes 0 Not Estab. %   Immature Grans (Abs) 0.0 0.0 - 0.1 x10E3/uL  Comprehensive metabolic panel  Result Value Ref Range   Glucose 90 70 - 99 mg/dL   BUN 20 8 - 27 mg/dL   Creatinine, Ser 0.80 0.57 - 1.00 mg/dL   eGFR 80 >59 mL/min/1.73   BUN/Creatinine Ratio 25 12 - 28   Sodium 143 134 - 144 mmol/L   Potassium 4.3 3.5 - 5.2 mmol/L   Chloride 106 96 - 106 mmol/L   CO2 25 20 - 29 mmol/L   Calcium 9.4 8.7 - 10.3 mg/dL   Total Protein 6.0 6.0 - 8.5 g/dL   Albumin 4.5 3.8 - 4.8 g/dL   Globulin, Total 1.5 1.5 - 4.5 g/dL   Albumin/Globulin Ratio 3.0 (H) 1.2 - 2.2   Bilirubin Total 0.3 0.0 - 1.2 mg/dL   Alkaline Phosphatase 93 44 - 121 IU/L   AST 17 0 - 40 IU/L   ALT 12 0 - 32 IU/L  Lipid Panel w/o Chol/HDL Ratio  Result Value Ref Range   Cholesterol, Total 191 100 - 199  mg/dL   Triglycerides 112 0 - 149 mg/dL   HDL 83 >39 mg/dL   VLDL Cholesterol Cal 19 5 - 40 mg/dL   LDL Chol Calc (NIH) 89 0 - 99 mg/dL  Urinalysis, Routine w reflex microscopic  Result Value Ref Range   Specific Gravity, UA 1.025 1.005 - 1.030   pH, UA 6.0 5.0 - 7.5   Color, UA Yellow Yellow   Appearance Ur Cloudy (A) Clear   Leukocytes,UA Negative Negative   Protein,UA Negative Negative/Trace   Glucose, UA Negative Negative   Ketones, UA Negative Negative   RBC, UA Trace (A) Negative   Bilirubin, UA Negative Negative   Urobilinogen, Ur 0.2 0.2 - 1.0 mg/dL   Nitrite, UA Negative Negative   Microscopic Examination See below:   TSH  Result Value Ref Range   TSH 1.420 0.450 - 4.500 uIU/mL  Microalbumin, Urine Waived  Result Value Ref Range   Microalb, Ur Waived 10 0 - 19 mg/L   Creatinine, Urine Waived 50 10 - 300 mg/dL   Microalb/Creat Ratio <30 <30 mg/g  Cologuard  Result Value Ref Range   COLOGUARD Negative Negative      Assessment & Plan:   Problem List Items Addressed This Visit   None Visit Diagnoses     Acute cystitis with hematuria    -  Primary   Will treat with cipro. Await culture. Call with any concerns. Conitnue to monitor.    Dysuria       3+ leuks, 2+ blood, Nit +   Relevant Orders   Urinalysis, Routine w reflex microscopic   WET PREP FOR TRICH, YEAST, CLUE        Follow up plan: Return if symptoms worsen or fail to improve.

## 2021-10-21 ENCOUNTER — Encounter: Payer: Self-pay | Admitting: Obstetrics and Gynecology

## 2021-10-21 ENCOUNTER — Ambulatory Visit (INDEPENDENT_AMBULATORY_CARE_PROVIDER_SITE_OTHER): Payer: PPO | Admitting: Obstetrics and Gynecology

## 2021-10-21 VITALS — BP 130/87 | HR 55 | Ht 64.0 in | Wt 158.0 lb

## 2021-10-21 DIAGNOSIS — R350 Frequency of micturition: Secondary | ICD-10-CM

## 2021-10-21 DIAGNOSIS — N993 Prolapse of vaginal vault after hysterectomy: Secondary | ICD-10-CM | POA: Diagnosis not present

## 2021-10-21 DIAGNOSIS — R3911 Hesitancy of micturition: Secondary | ICD-10-CM | POA: Diagnosis not present

## 2021-10-21 DIAGNOSIS — N8110 Cystocele, unspecified: Secondary | ICD-10-CM | POA: Diagnosis not present

## 2021-10-21 LAB — POCT URINALYSIS DIPSTICK
Appearance: NORMAL
Bilirubin, UA: NEGATIVE
Blood, UA: NEGATIVE
Glucose, UA: NEGATIVE
Ketones, UA: NEGATIVE
Nitrite, UA: NEGATIVE
Protein, UA: NEGATIVE
Spec Grav, UA: 1.01 (ref 1.010–1.025)
Urobilinogen, UA: 0.2 E.U./dL
pH, UA: 6 (ref 5.0–8.0)
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
## 2021-10-21 NOTE — Patient Instructions (Signed)

## 2021-11-21 ENCOUNTER — Ambulatory Visit: Payer: PPO

## 2021-11-29 ENCOUNTER — Ambulatory Visit (INDEPENDENT_AMBULATORY_CARE_PROVIDER_SITE_OTHER): Payer: PPO | Admitting: *Deleted

## 2021-11-29 DIAGNOSIS — Z0000 Encounter for general adult medical examination without abnormal findings: Secondary | ICD-10-CM | POA: Diagnosis not present

## 2021-11-29 NOTE — Progress Notes (Signed)
Subjective:   Makayla [name] is a 71 y.o. female who presents for Medicare Annual (Subsequent) preventive examination.  I connected with  Makayla [name] on 11/29/21 by a telephone enabled telemedicine application and verified that I am speaking with the correct person using two identifiers.   I discussed the limitations of evaluation and management by telemedicine. The patient expressed understanding and agreed to proceed.  Patient location: home  Provider location:  Tele-health-home    Review of Systems     Cardiac Risk Factors include: advanced age (>48mn, >>76women);hypertension     Objective:    Today's Vitals   11/29/21 1036  PainSc: 4    There is no height or weight on file to calculate BMI.     11/29/2021   10:40 AM 03/24/2021    8:42 AM 03/14/2021    3:20 PM 11/19/2020   10:31 AM 08/25/2019    9:05 AM 07/03/2018    2:34 PM  Advanced Directives  Does Patient Have a Medical Advance Directive? Yes Yes Yes No No No  Type of [place]      Does patient want to make changes to medical advance directive?  No - Patient declined   Yes (MAU/Ambulatory/Procedural Areas - Information given)   Copy of [place]? No - copy requested No - copy requested      Would patient like information on creating a medical advance directive?  No - Patient declined    No - Patient declined    Current Medications (verified) Outpatient Encounter Medications as of 11/29/2021  Medication Sig   acetaminophen (TYLENOL) 500 MG tablet Take 500-1,000 mg by mouth every 8 (eight) hours as needed for moderate pain.   atorvastatin (LIPITOR) 20 MG tablet Take 1 tablet (20 mg total) by mouth daily.   ibuprofen (ADVIL,MOTRIN) 200 MG tablet Take 400 mg by mouth every 8 (eight) hours as needed for moderate pain.   lisinopril (ZESTRIL) 10 MG tablet Take 1 tablet (10 mg total) by mouth daily.    Multiple Vitamins-Minerals (MULTIVITAMIN WITH MINERALS) tablet Take 1 tablet by mouth daily.   omeprazole (PRILOSEC) 20 MG capsule TAKE 1 CAPSULE BY MOUTH EVERY DAY (Patient not taking: Reported on 10/21/2021)   No facility-administered encounter medications on file as of 11/29/2021.    Allergies (verified) Crestor [rosuvastatin calcium]   History: Past Medical History:  Diagnosis Date   DDD (degenerative disc disease), lumbar    Family history of adverse reaction to anesthesia    GERD (gastroesophageal reflux disease)    Hypertension    Sciatica    Uterine prolapse    Past Surgical History:  Procedure Laterality Date   COLONOSCOPY     CYSTOSCOPY N/A 03/24/2021   Procedure: CYSTOSCOPY;  Surgeon: HGae Dry MD;  Location: [facility];  Service: Gynecology;  Laterality: N/A;   OOPHORECTOMY     OVARIAN CYST REMOVAL Left    TOTAL ABDOMINAL HYSTERECTOMY W/ BILATERAL SALPINGOOPHORECTOMY     TOTAL LAPAROSCOPIC HYSTERECTOMY WITH BILATERAL SALPINGO OOPHORECTOMY Bilateral 03/24/2021   Procedure: TOTAL LAPAROSCOPIC HYSTERECTOMY WITH BILATERAL SALPINGO OOPHORECTOMY;  Surgeon: HGae Dry MD;  Location: [facility];  Service: Gynecology;  Laterality: Bilateral;   TUBAL LIGATION     X 2   Family History  Problem Relation Age of Onset   Breast cancer Sister 69  Stroke Mother    Cancer Brother  Melanoma- went to liver   Melanoma Brother    Liver cancer Brother    Liver cancer Brother    Social History   Socioeconomic History   Marital status: Married    Spouse name: Not on file   Number of children: Not on file   Years of education: Not on file   Highest education level: High school graduate  Occupational History   Occupation: retired  Tobacco Use   Smoking status: Former    Packs/day: 0.50    Years: 15.00    Total pack years: 7.50    Types: Cigarettes    Quit date: 03/23/2002    Years since quitting: 19.7   Smokeless tobacco: Never  Vaping Use   Vaping Use:  Never used  Substance and Sexual Activity   Alcohol use: No   Drug use: No   Sexual activity: Not Currently  Other Topics Concern   Not on file  Social History Narrative   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    Social Determinants of Health   Financial Resource Strain: [place]  (11/19/2020)   Overall Financial Resource Strain (CARDIA)    Difficulty of Paying Living Expenses: Not hard at all  Food Insecurity: No Food Insecurity (11/29/2021)   Hunger Vital Sign    Worried About Running Out of Food in the Last Year: Never true    [place] in the Last Year: Never true  Transportation Needs: No Transportation Needs (11/29/2021)   PRAPARE - Hydrologist (Medical): No    Lack of Transportation (Non-Medical): No  Physical Activity: Sufficiently Active (11/29/2021)   Exercise Vital Sign    Days of Exercise per Week: 5 days    Minutes of Exercise per Session: 40 min  Stress: No Stress Concern Present (11/29/2021)   [place]    Feeling of Stress : Not at all  Social Connections: [place] (11/29/2021)   Social Connection and Isolation Panel [NHANES]    Frequency of Communication with Friends and Family: More than three times a week    Frequency of Social Gatherings with Friends and Family: More than three times a week    Attends Religious Services: More than 4 times per year    Active Member of [employer] or Organizations: Yes    Attends Music therapist: More than 4 times per year    Marital Status: Married    Tobacco Counseling Counseling given: Not Answered   Clinical Intake:  Pre-visit preparation completed: Yes  Pain : 0-10 Pain Score: 4  Pain Type: Chronic pain Pain Location: Back Pain Descriptors / Indicators: Burning, Aching, Dull Pain Onset: More than a month ago Pain Frequency: Constant Pain Relieving Factors: ibuprofen,  methocarbonal  Pain Relieving Factors:  ibuprofen,  methocarbonal  Nutritional Risks: None Diabetes: No  How often do you need to have someone help you when you read instructions, pamphlets, or other written materials from your doctor or pharmacy?: 1 - Never  Diabetic?  no  Interpreter Needed?: No  Information entered by :: Leroy Kennedy LPN   Activities of Daily Living    11/29/2021   10:44 AM 03/14/2021    3:19 PM  In your present state of health, do you have any difficulty performing the following activities:  Hearing? 0   Vision? 0   Difficulty concentrating or making decisions? 0   Walking or climbing stairs? 0   Dressing or bathing? 0  Doing errands, shopping? 0 0  Preparing Food and eating ? N   Using the Toilet? N   In the past six months, have you accidently leaked urine? N   Do you have problems with loss of bowel control? N   Managing your Medications? N   Managing your Finances? N   Housekeeping or managing your Housekeeping? N     Patient Care Team: Valerie Roys, DO as PCP - General (Family Medicine) Dimmig, Marcello Moores, MD as Referring Physician (Orthopedic Surgery)  Indicate any recent Medical Services you may have received from other than [facility] providers in the past year (date may be approximate).     Assessment:   This is a routine wellness examination for Krizia.  Hearing/Vision screen Hearing Screening - Comments:: No trouble hearing Vision Screening - Comments:: Up to date [facility]  Dietary issues and exercise activities discussed: Current Exercise Habits: Home exercise routine, Type of exercise: walking, Time (Minutes): 40, Frequency (Times/Week): 4, Weekly Exercise (Minutes/Week): 160, Intensity: Moderate   Goals Addressed               This Visit's Progress     DIET - REDUCE SODIUM INTAKE (pt-stated)   On track     Patient Stated        Continue walking        Depression Screen    11/29/2021   10:44 AM 09/30/2021    8:38 AM 09/02/2021    8:14 AM 11/19/2020   10:32 AM  09/01/2020    8:07 AM 08/25/2019    9:04 AM 07/03/2018    2:32 PM  PHQ 2/9 Scores  PHQ - 2 Score 0 0 0 0 0 0 0  PHQ- 9 Score 0 0 1        Fall Risk    11/29/2021   10:38 AM 09/02/2021    8:14 AM 11/19/2020   10:32 AM 09/01/2020    8:07 AM 08/25/2019    9:04 AM  Fall Risk   Falls in the past year? 0 0 0 0 0  Number falls in past yr:  0  0 0  Injury with Fall? 0 0  0 0  Risk for fall due to :  No Fall Risks Medication side effect No Fall Risks   Follow up Falls evaluation completed;Education provided;Falls prevention discussed Falls evaluation completed Falls evaluation completed;Education provided;Falls prevention discussed Falls evaluation completed     FALL RISK PREVENTION PERTAINING TO THE HOME:  Any stairs in or around the home? Yes  If so, are there any without handrails? No  Home free of loose throw rugs in walkways, pet beds, electrical cords, etc? Yes  Adequate lighting in your home to reduce risk of falls? Yes   ASSISTIVE DEVICES UTILIZED TO PREVENT FALLS:  Life alert? No  Use of a cane, walker or w/c? No  Grab bars in the bathroom? Yes  Shower chair or bench in shower? No  Elevated toilet seat or a handicapped toilet? No   TIMED UP AND GO:  Was the test performed? No .    Cognitive Function:        11/29/2021   10:39 AM 11/19/2020   10:33 AM 08/25/2019    9:06 AM 07/03/2018    2:36 PM 06/19/2017    2:23 PM  6CIT Screen  What Year? 0 points 0 points 0 points 0 points 0 points  What month? 0 points 0 points 0 points 0 points 0 points  What time? 0  points 0 points 0 points 0 points 0 points  Count back from 20 0 points 0 points 0 points 0 points 0 points  Months in reverse 0 points 0 points 0 points 0 points 0 points  Repeat phrase 0 points 2 points 0 points 0 points 0 points  Total Score 0 points 2 points 0 points 0 points 0 points    Immunizations Immunization History  Administered Date(s) Administered   Fluad Quad(high Dose 65+) 02/23/2021   Influenza,  High Dose Seasonal PF 03/15/2018   Influenza,inj,Quad PF,6+ Mos 02/18/2019   Influenza-Unspecified 03/05/2017   Janssen (J&J) SARS-COV-2 Vaccination 07/25/2019   Pneumococcal Conjugate-13 06/19/2017   Pneumococcal Polysaccharide-23 05/15/2012, 07/03/2018   Td 09/01/2020   Tetanus 10/29/2005   Zoster, Live 01/25/2015    TDAP status: Up to date  Flu Vaccine status: Up to date  Pneumococcal vaccine status: Up to date  Covid-19 vaccine status: Information provided on how to obtain vaccines.   Qualifies for Shingles Vaccine? Yes   Zostavax completed Yes   Shingrix Completed?: Yes  Screening Tests Health Maintenance  Topic Date Due   COLONOSCOPY (Pts 45-69yr Insurance coverage will need to be confirmed)  03/04/2019   COVID-19 Vaccine (2 - Janssen risk series) 12/15/2021 (Originally 08/22/2019)   Zoster Vaccines- Shingrix (1 of 2) 02/14/2022 (Originally 05/27/1969)   INFLUENZA VACCINE  12/13/2021   MAMMOGRAM  08/10/2023   TETANUS/TDAP  09/02/2030   Pneumonia Vaccine 69 Years old  Completed   DEXA SCAN  Completed   Hepatitis C Screening  Completed   HPV VACCINES  Aged Out    Health Maintenance  Health Maintenance Due  Topic Date Due   COLONOSCOPY (Pts 45-427yrInsurance coverage will need to be confirmed)  03/04/2019    Colorectal cancer screening: Type of screening: Cologuard. Completed 2023. Repeat every 3 years  Mammogram status: Completed  . Repeat every year  Bone Density status: Completed 2021. Results reflect: Bone density results: OSTEOPENIA. Repeat every 5 years.  Lung Cancer Screening: (Low Dose CT Chest recommended if Age 69-80ears, 30 pack-year currently smoking OR have quit w/in 15years.) does not qualify.   Lung Cancer Screening Referral:   Additional Screening:  Hepatitis C Screening: does not qualify; Completed 2019  Vision Screening: Recommended annual ophthalmology exams for early detection of glaucoma and other disorders of the eye. Is the  patient up to date with their annual eye exam?  Yes  Who is the provider or what is the name of the office in which the patient attends annual eye exams? [facility] If pt is not established with a provider, would they like to be referred to a provider to establish care? No .   Dental Screening: Recommended annual dental exams for proper oral hygiene  Community Resource Referral / Chronic Care Management: CRR required this visit?  No   CCM required this visit?  No      Plan:     I have personally reviewed and noted the following in the patient's chart:   Medical and social history Use of alcohol, tobacco or illicit drugs  Current medications and supplements including opioid prescriptions.  Functional ability and status Nutritional status Physical activity Advanced directives List of other physicians Hospitalizations, surgeries, and ER visits in previous 12 months Vitals Screenings to include cognitive, depression, and falls Referrals and appointments  In addition, I have reviewed and discussed with patient certain preventive protocols, quality metrics, and best practice recommendations. A written personalized care plan for preventive services as  well as general preventive health recommendations were provided to patient.     Leroy Kennedy, LPN   [id number]   Nurse Notes:

## 2021-11-29 NOTE — Patient Instructions (Addendum)
Makayla [name] , Thank you for taking time to come for your Medicare Wellness Visit. I appreciate your ongoing commitment to your health goals. Please review the following plan we discussed and let me know if I can assist you in the future.   Screening recommendations/referrals: Colonoscopy: up to date Mammogram: up to date Bone Density: up to date Recommended yearly ophthalmology/optometry visit for glaucoma screening and checkup Recommended yearly dental visit for hygiene and checkup  Vaccinations: Influenza vaccine: up to date Pneumococcal vaccine: up to date Tdap vaccine: up to date Shingles vaccine: up to date    Advanced directives: not on file  Conditions/risks identified:   Next appointment: 03-06-2022 @ 9:00  [facility] 65 Years and Older, Female Preventive care refers to lifestyle choices and visits with your health care provider that can promote health and wellness. What does preventive care include? A yearly physical exam. This is also called an annual well check. Dental exams once or twice a year. Routine eye exams. Ask your health care provider how often you should have your eyes checked. Personal lifestyle choices, including: Daily care of your teeth and gums. Regular physical activity. Eating a healthy diet. Avoiding tobacco and drug use. Limiting alcohol use. Practicing safe sex. Taking low-dose aspirin every day. Taking vitamin and mineral supplements as recommended by your health care provider. What happens during an annual well check? The services and screenings done by your health care provider during your annual well check will depend on your age, overall health, lifestyle risk factors, and family history of disease. Counseling  Your health care provider may ask you questions about your: Alcohol use. Tobacco use. Drug use. Emotional well-being. Home and relationship well-being. Sexual activity. Eating habits. History of falls. Memory  and ability to understand (cognition). Work and work Statistician. Reproductive health. Screening  You may have the following tests or measurements: Height, weight, and BMI. Blood pressure. Lipid and cholesterol levels. These may be checked every 5 years, or more frequently if you are over 101 years old. Skin check. Lung cancer screening. You may have this screening every year starting at age 79 if you have a 30-pack-year history of smoking and currently smoke or have quit within the past 15 years. Fecal occult blood test (FOBT) of the stool. You may have this test every year starting at age 95. Flexible sigmoidoscopy or colonoscopy. You may have a sigmoidoscopy every 5 years or a colonoscopy every 10 years starting at age 69. Hepatitis C blood test. Hepatitis B blood test. Sexually transmitted disease (STD) testing. Diabetes screening. This is done by checking your blood sugar (glucose) after you have not eaten for a while (fasting). You may have this done every 1-3 years. Bone density scan. This is done to screen for osteoporosis. You may have this done starting at age 30. Mammogram. This may be done every 1-2 years. Talk to your health care provider about how often you should have regular mammograms. Talk with your health care provider about your test results, treatment options, and if necessary, the need for more tests. Vaccines  Your health care provider may recommend certain vaccines, such as: Influenza vaccine. This is recommended every year. Tetanus, diphtheria, and acellular pertussis (Tdap, Td) vaccine. You may need a Td booster every 10 years. Zoster vaccine. You may need this after age 57. Pneumococcal 13-valent conjugate (PCV13) vaccine. One dose is recommended after age 15. Pneumococcal polysaccharide (PPSV23) vaccine. One dose is recommended after age 76. Talk to your  health care provider about which screenings and vaccines you need and how often you need them. This  information is not intended to replace advice given to you by your health care provider. Make sure you discuss any questions you have with your health care provider. Document Released: 05/28/2015 Document Revised: 01/19/2016 Document Reviewed: 03/02/2015 Elsevier Interactive Patient Education  2017 [place] Prevention in the Home Falls can cause injuries. They can happen to people of all ages. There are many things you can do to make your home safe and to help prevent falls. What can I do on the outside of my home? Regularly fix the edges of walkways and driveways and fix any cracks. Remove anything that might make you trip as you walk through a door, such as a raised step or threshold. Trim any bushes or trees on the path to your home. Use bright outdoor lighting. Clear any walking paths of anything that might make someone trip, such as rocks or tools. Regularly check to see if handrails are loose or broken. Make sure that both sides of any steps have handrails. Any raised decks and porches should have guardrails on the edges. Have any leaves, snow, or ice cleared regularly. Use sand or salt on walking paths during winter. Clean up any spills in your garage right away. This includes oil or grease spills. What can I do in the bathroom? Use night lights. Install grab bars by the toilet and in the tub and shower. Do not use towel bars as grab bars. Use non-skid mats or decals in the tub or shower. If you need to sit down in the shower, use a plastic, non-slip stool. Keep the floor dry. Clean up any water that spills on the floor as soon as it happens. Remove soap buildup in the tub or shower regularly. Attach bath mats securely with double-sided non-slip rug tape. Do not have throw rugs and other things on the floor that can make you trip. What can I do in the bedroom? Use night lights. Make sure that you have a light by your bed that is easy to reach. Do not use any sheets or  blankets that are too big for your bed. They should not hang down onto the floor. Have a firm chair that has side arms. You can use this for support while you get dressed. Do not have throw rugs and other things on the floor that can make you trip. What can I do in the kitchen? Clean up any spills right away. Avoid walking on wet floors. Keep items that you use a lot in easy-to-reach places. If you need to reach something above you, use a strong step stool that has a grab bar. Keep electrical cords out of the way. Do not use floor polish or wax that makes floors slippery. If you must use wax, use non-skid floor wax. Do not have throw rugs and other things on the floor that can make you trip. What can I do with my stairs? Do not leave any items on the stairs. Make sure that there are handrails on both sides of the stairs and use them. Fix handrails that are broken or loose. Make sure that handrails are as long as the stairways. Check any carpeting to make sure that it is firmly attached to the stairs. Fix any carpet that is loose or worn. Avoid having throw rugs at the top or bottom of the stairs. If you do have throw rugs, attach them  to the floor with carpet tape. Make sure that you have a light switch at the top of the stairs and the bottom of the stairs. If you do not have them, ask someone to add them for you. What else can I do to help prevent falls? Wear shoes that: Do not have high heels. Have rubber bottoms. Are comfortable and fit you well. Are closed at the toe. Do not wear sandals. If you use a stepladder: Make sure that it is fully opened. Do not climb a closed stepladder. Make sure that both sides of the stepladder are locked into place. Ask someone to hold it for you, if possible. Clearly mark and make sure that you can see: Any grab bars or handrails. First and last steps. Where the edge of each step is. Use tools that help you move around (mobility aids) if they are  needed. These include: Canes. Walkers. Scooters. Crutches. Turn on the lights when you go into a dark area. Replace any light bulbs as soon as they burn out. Set up your furniture so you have a clear path. Avoid moving your furniture around. If any of your floors are uneven, fix them. If there are any pets around you, be aware of where they are. Review your medicines with your doctor. Some medicines can make you feel dizzy. This can increase your chance of falling. Ask your doctor what other things that you can do to help prevent falls. This information is not intended to replace advice given to you by your health care provider. Make sure you discuss any questions you have with your health care provider. Document Released: 02/25/2009 Document Revised: 10/07/2015 Document Reviewed: 06/05/2014 Elsevier Interactive Patient Education  2017 [employer].

## 2021-12-07 ENCOUNTER — Ambulatory Visit (INDEPENDENT_AMBULATORY_CARE_PROVIDER_SITE_OTHER): Payer: PPO | Admitting: Obstetrics and Gynecology

## 2021-12-07 VITALS — BP 113/83 | HR 75 | Ht 65.0 in | Wt 158.0 lb

## 2021-12-07 DIAGNOSIS — N393 Stress incontinence (female) (male): Secondary | ICD-10-CM | POA: Diagnosis not present

## 2021-12-07 DIAGNOSIS — R350 Frequency of micturition: Secondary | ICD-10-CM | POA: Diagnosis not present

## 2021-12-07 LAB — POCT URINALYSIS DIPSTICK
Appearance: NORMAL
Bilirubin, UA: NEGATIVE
Glucose, UA: NEGATIVE
Ketones, UA: NEGATIVE
Leukocytes, UA: NEGATIVE
Nitrite, UA: NEGATIVE
Protein, UA: NEGATIVE
Spec Grav, UA: 1.02 (ref 1.010–1.025)
Urobilinogen, UA: 0.2 E.U./dL
pH, UA: 6 (ref 5.0–8.0)

## 2021-12-07 NOTE — Patient Instructions (Signed)

## 2021-12-08 NOTE — Progress Notes (Signed)
Makayla [name]  Referring Physician: Valerie Roys, DO Date of [name]: 12/07/2021  Makayla [name] is a 71 y.o. female who presents for urodynamic evaluation. Indication(s) for study: occult SUI and urinary hesitancy  Vital Signs: BP 113/83   Pulse 75   Ht '5\' 5"'$  (1.651 m)   Wt 158 lb (71.7 kg)   BMI 26.29 kg/m   Laboratory Results: A catheterized urine specimen revealed:  POC urine: trace blood, otherwise negative  Voiding Diary: Not performed  [name] Timeout:  The correct patient was verified and the correct [name] was verified. The patient was in the correct position and safety precautions were reviewed based on at the patient's history.  Urodynamic [name] A 41F dual lumen urodynamics catheter was placed under sterile conditions into the patient's bladder. A 41F catheter was placed into the rectum in order to measure abdominal pressure. EMG patches were placed in the appropriate position.  All connections were confirmed and calibrations/adjusted made. Saline was instilled into the bladder through the dual lumen catheters.  Cough/valsalva pressures were measured periodically during filling.  Patient was allowed to void.  The bladder was then emptied of its residual.  UROFLOW: Revealed a Qmax of 11 mL/sec.  She voided 96 mL and had a residual of 80 mL.  It was a normal pattern and represented normal habits though interpretation limited due to low voided volume.  CMG: This was performed with sterile water in the sitting position at a fill rate of 30 mL/min.    First sensation of fullness was 100 mLs,  First urge was 162 mLs,  Strong urge was 225 mLs and  Capacity was 255 mLs  Stress incontinence was demonstrated Highest negative Barrier CLPP was 106 cmH20 at 200 ml. Lowest positive Barrier VLPP was 66 cmH20 at 200 ml.  Detrusor function was normal, with no phasic contractions seen.    Compliance:  normal. End fill detrusor  pressure was -4cmH20.    UPP: MUCP with barrier reduction was 35 cm of water.    MICTURITION STUDY: Voiding was performed with reduction using scopettes in the sitting position.  Pdet at Qmax was 0 cm of water.  Qmax was 15.6 mL/sec.  It was a interrupted pattern.  She voided 215 mL and had a residual of 10 mL.  It was a volitional void, abdominal straining was present initially until sustained detrusor contraction was present   EMG: This was performed with patches.  She had voluntary contractions, recruitment with fill was present and urethral sphincter was relaxed with void.  The details of the [name] with the study tracings have been scanned into EPIC.   Urodynamic Impression:  1. Sensation was normal; capacity was reduced 2. Stress Incontinence was demonstrated at normal pressures; 3. Detrusor Overactivity was not demonstrated 4. Emptying was dysfunctional with a normal PVR, a sustained detrusor contraction present,  abdominal straining present, normal urethral sphincter activity on EMG.  Plan: - The patient will follow up  to discuss the findings and treatment options.

## 2021-12-12 ENCOUNTER — Ambulatory Visit (INDEPENDENT_AMBULATORY_CARE_PROVIDER_SITE_OTHER): Payer: PPO | Admitting: Obstetrics and Gynecology

## 2021-12-12 ENCOUNTER — Encounter: Payer: Self-pay | Admitting: Obstetrics and Gynecology

## 2021-12-12 VITALS — BP 102/72 | HR 73

## 2021-12-12 DIAGNOSIS — N393 Stress incontinence (female) (male): Secondary | ICD-10-CM

## 2021-12-12 DIAGNOSIS — N993 Prolapse of vaginal vault after hysterectomy: Secondary | ICD-10-CM | POA: Diagnosis not present

## 2021-12-12 DIAGNOSIS — N8110 Cystocele, unspecified: Secondary | ICD-10-CM | POA: Diagnosis not present

## 2021-12-12 NOTE — Progress Notes (Signed)
[facility] Return Visit  SUBJECTIVE  History of Present Illness: RONNY RUDDELL is a 71 y.o. female seen in follow-up after urodynamic testing. She is interested in proceeding with prolapse repair.   Urodynamic Impression:  1. Sensation was normal; capacity was reduced 2. Stress Incontinence was demonstrated at normal pressures; 3. Detrusor Overactivity was not demonstrated 4. Emptying was dysfunctional with a normal PVR, a sustained detrusor contraction present,  abdominal straining present, normal urethral sphincter activity on EMG.  Past Medical History: Patient  has a past medical history of DDD (degenerative disc disease), lumbar, Family history of adverse reaction to anesthesia, GERD (gastroesophageal reflux disease), Hypertension, Sciatica, and Uterine prolapse.   Past Surgical History: She  has a past surgical history that includes Tubal ligation; Ovarian cyst removal (Left); Colonoscopy; Total laparoscopic hysterectomy with bilateral salpingo oophorectomy (Bilateral, 03/24/2021); Cystoscopy (N/A, 03/24/2021); Total abdominal hysterectomy w/ bilateral salpingoophorectomy; and Oophorectomy.   Medications: She has a current medication list which includes the following prescription(s): acetaminophen, atorvastatin, ibuprofen, lisinopril, multivitamin with minerals, and omeprazole.   Allergies: Patient is allergic to crestor [rosuvastatin calcium].   Social History: Patient  reports that she quit smoking about 19 years ago. Her smoking use included cigarettes. She has a 7.50 pack-year smoking history. She has never used smokeless tobacco. She reports that she does not drink alcohol and does not use drugs.      OBJECTIVE     Physical Exam: Vitals:   12/12/21 1257  BP: 102/72  Pulse: 73   Gen: No apparent distress, A&O x 3.  Detailed Urogynecologic Evaluation:  Deferred. Prior exam showed:  POP-Q:    POP-Q   1                                             Aa   1                                           Ba   -3                                              C    5                                            Gh   3                                            Pb   7                                            tvl    -2  Ap   -2                                            Bp                                                  D     ASSESSMENT AND PLAN    Ms. Ragas is a 71 y.o. with:  1. SUI (stress urinary incontinence, female)   2. Prolapse of anterior vaginal wall   3. Vaginal vault prolapse after hysterectomy    Plan for surgery: Exam under anesthesia, robotic assisted sacrocolpopexy, midurethral sling, cystoscopy  - We reviewed the patient's specific anatomic and functional findings, with the assistance of diagrams, and together finalized the above procedure. The planned surgical procedures were discussed along with the surgical risks outlined below, which were also provided on a detailed handout. Additional treatment options including expectant management, conservative management, medical management were discussed where appropriate.  We reviewed the benefits and risks of each treatment option.  - Reviewed that with the sling, she should not strain/ push to void as this may contribute to incomplete bladder emptying.   General Surgical Risks: For all procedures, there are risks of bleeding, infection, damage to surrounding organs including but not limited to bowel, bladder, blood vessels, ureters and nerves, and need for further surgery if an injury were to occur. These risks are all low with minimally invasive surgery.   - For preop Visit:  She is required to have a visit within 30 days of her surgery.    - Medical clearance: not required - Anticoagulant use: No - Medicaid Hysterectomy form: n/a - Accepts blood transfusion: will confirm at pre op visit - Expected length of stay:  outpatient  Request sent for surgery scheduling.   [name], MD

## 2022-01-10 ENCOUNTER — Other Ambulatory Visit: Payer: Self-pay

## 2022-01-10 ENCOUNTER — Encounter (HOSPITAL_BASED_OUTPATIENT_CLINIC_OR_DEPARTMENT_OTHER): Payer: Self-pay | Admitting: Obstetrics and Gynecology

## 2022-01-10 NOTE — Progress Notes (Addendum)
Spoke w/ via phone for pre-op interview---pt Lab needs dos----none per anesthesia, surgery orders pending               Lab results------ekg 03-16-2021 chart/epic COVID test -----patient states asymptomatic no test needed Arrive at -------930 am 01-24-2022 NPO after MN NO Solid Food.  Clear liquids from MN until---830 am Med rec completed Medications to take morning of surgery -----atorvastatin, omeprazole Diabetic medication -----n/a Patient instructed no nail polish to be worn day of surgery Patient instructed to bring photo id and insurance card day of surgery Patient aware to have Driver (ride ) / caregiver  husband Makayla [name] or daughter Makayla [name] Patient Special Instructions -----pt given extended recovery/overnight stay instructions Pre-Op special Istructions -----surgery orders  requested dr schroeder epic ib Patient verbalized understanding of instructions that were given at this phone interview. Patient denies shortness of breath, chest pain, fever, cough at this phone interview.

## 2022-01-11 ENCOUNTER — Encounter: Payer: Self-pay | Admitting: Obstetrics and Gynecology

## 2022-01-11 ENCOUNTER — Ambulatory Visit (INDEPENDENT_AMBULATORY_CARE_PROVIDER_SITE_OTHER): Payer: PPO | Admitting: Obstetrics and Gynecology

## 2022-01-11 VITALS — BP 114/78 | HR 82

## 2022-01-11 DIAGNOSIS — Z01818 Encounter for other preprocedural examination: Secondary | ICD-10-CM

## 2022-01-11 DIAGNOSIS — N993 Prolapse of vaginal vault after hysterectomy: Secondary | ICD-10-CM

## 2022-01-11 DIAGNOSIS — N393 Stress incontinence (female) (male): Secondary | ICD-10-CM | POA: Diagnosis not present

## 2022-01-11 DIAGNOSIS — N8110 Cystocele, unspecified: Secondary | ICD-10-CM | POA: Diagnosis not present

## 2022-01-11 MED ORDER — IBUPROFEN 600 MG PO TABS: 600.0000 mg | ORAL_TABLET | Freq: Four times a day (QID) | ORAL | 0 refills | Status: DC | PRN

## 2022-01-11 MED ORDER — POLYETHYLENE GLYCOL 3350 17 GM/SCOOP PO POWD: 17.0000 g | Freq: Every day | ORAL | 0 refills | Status: DC

## 2022-01-11 MED ORDER — TRAMADOL HCL 50 MG PO TABS
50.0000 mg | ORAL_TABLET | Freq: Three times a day (TID) | ORAL | 0 refills | Status: AC | PRN
Start: 2022-01-11 — End: 2022-01-16

## 2022-01-11 MED ORDER — ACETAMINOPHEN 500 MG PO TABS: 500.0000 mg | ORAL_TABLET | Freq: Four times a day (QID) | ORAL | 0 refills | Status: DC | PRN

## 2022-01-11 NOTE — H&P (Signed)
[facility] Pre-Operative H&P  Subjective Chief Complaint: Kryslyn Helbig Mcgeehan presents for a preoperative encounter.   History of Present Illness: MARENE GILLIAM is a 71 y.o. female who presents for preoperative visit.  She is scheduled to undergo Exam under anesthesia, robotic assisted sacrocolpopexy, midurethral sling, cystoscopy on 01/24/22.  Her symptoms include vaginal bulge and incontinence, and she was was found to have Stage II anterior, Stage I posterior, Stage I apical prolapse.  Urodynamic Impression:  1. Sensation was normal; capacity was reduced 2. Stress Incontinence was demonstrated at normal pressures; 3. Detrusor Overactivity was not demonstrated 4. Emptying was dysfunctional with a normal PVR, a sustained detrusor contraction present,  abdominal straining present, normal urethral sphincter activity on EMG.  Past Medical History:  Diagnosis Date   DDD (degenerative disc disease), lumbar    Family history of adverse reaction to anesthesia    grandson age 37 stopped breathing stayed in hospital for a few days,no cause found grandson is now 33   GERD (gastroesophageal reflux disease)    Hypertension    Sciatica    right     Past Surgical History:  Procedure Laterality Date   COLONOSCOPY     cologuard negative   CYSTOSCOPY N/A 03/24/2021   Procedure: CYSTOSCOPY;  Surgeon: Gae Dry, MD;  Location: [facility] ORS;  Service: Gynecology;  Laterality: N/A;   CYSTOSCOPY  2013   OOPHORECTOMY     OVARIAN CYST REMOVAL Left    age 25   TOTAL LAPAROSCOPIC HYSTERECTOMY WITH BILATERAL SALPINGO OOPHORECTOMY Bilateral 03/24/2021   Procedure: TOTAL LAPAROSCOPIC HYSTERECTOMY WITH BILATERAL SALPINGO OOPHORECTOMY;  Surgeon: Gae Dry, MD;  Location: [facility] ORS;  Service: Gynecology;  Laterality: Bilateral;   TUBAL LIGATION     X 2    is allergic to crestor [rosuvastatin calcium].   Family History  Problem Relation Age of Onset   Breast cancer Sister 49    Stroke Mother    Cancer Brother        Melanoma- went to liver   Melanoma Brother    Liver cancer Brother    Liver cancer Brother     Social History   Tobacco Use   Smoking status: Former    Packs/day: 0.50    Years: 15.00    Total pack years: 7.50    Types: Cigarettes    Quit date: 03/23/2002    Years since quitting: 19.8   Smokeless tobacco: Never  Vaping Use   Vaping Use: Never used  Substance Use Topics   Alcohol use: No   Drug use: No     Review of Systems was negative for a full 10 system review except as noted in the History of Present Illness.  No current facility-administered medications for this encounter.  Current Outpatient Medications:    OVER THE COUNTER MEDICATION, Vitamin b 12 daily, Disp: , Rfl:    acetaminophen (TYLENOL) 500 MG tablet, Take 500-1,000 mg by mouth every 8 (eight) hours as needed for moderate pain., Disp: , Rfl:    acetaminophen (TYLENOL) 500 MG tablet, Take 1 tablet (500 mg total) by mouth every 6 (six) hours as needed (pain)., Disp: 30 tablet, Rfl: 0   atorvastatin (LIPITOR) 20 MG tablet, Take 1 tablet (20 mg total) by mouth daily., Disp: 90 tablet, Rfl: 1   ibuprofen (ADVIL) 600 MG tablet, Take 1 tablet (600 mg total) by mouth every 6 (six) hours as needed., Disp: 30 tablet, Rfl: 0   ibuprofen (ADVIL,MOTRIN) 200 MG tablet, Take  400 mg by mouth every 8 (eight) hours as needed for moderate pain., Disp: , Rfl:    lisinopril (ZESTRIL) 10 MG tablet, Take 1 tablet (10 mg total) by mouth daily., Disp: 90 tablet, Rfl: 1   Multiple Vitamins-Minerals (MULTIVITAMIN WITH MINERALS) tablet, Take 1 tablet by mouth daily., Disp: , Rfl:    omeprazole (PRILOSEC) 20 MG capsule, TAKE 1 CAPSULE BY MOUTH EVERY DAY (Patient taking differently: as needed. TAKE 1 CAPSULE BY MOUTH EVERY DAY), Disp: 90 capsule, Rfl: 1   polyethylene glycol powder (GLYCOLAX/MIRALAX) 17 GM/SCOOP powder, Take 17 g by mouth daily. Drink 17g (1 scoop) dissolved in water per day., Disp: 255  g, Rfl: 0   traMADol (ULTRAM) 50 MG tablet, Take 1 tablet (50 mg total) by mouth every 8 (eight) hours as needed for up to 5 days., Disp: 10 tablet, Rfl: 0   Objective There were no vitals filed for this visit.   Gen: NAD CV: S1 S2 RRR Lungs: Clear to auscultation bilaterally Abd: soft, nontender   Previous Pelvic Exam showed: POP-Q:    POP-Q   1                                            Aa   1                                           Ba   -3                                              C    5                                            Gh   3                                            Pb   7                                            tvl    -2                                            Ap   -2                                            Bp  D      Assessment/ Plan  The patient is a 71 y.o. year old  with stage II POP and incontinence scheduled to undergo Exam under anesthesia, robotic assisted sacrocolpopexy, midurethral sling, cystoscopy.    [name], MD

## 2022-01-11 NOTE — Progress Notes (Signed)
[facility] Pre-Operative visit  Subjective Chief Complaint: Makayla [name] presents for a preoperative encounter.   History of Present Illness: Makayla [name] is a 71 y.o. female who presents for preoperative visit.  She is scheduled to undergo Exam under anesthesia, robotic assisted sacrocolpopexy, midurethral sling, cystoscopy on 01/24/22.  Her symptoms include vaginal bulge and incontinence, and she was was found to have Stage II anterior, Stage I posterior, Stage I apical prolapse.  Urodynamic Impression:  1. Sensation was normal; capacity was reduced 2. Stress Incontinence was demonstrated at normal pressures; 3. Detrusor Overactivity was not demonstrated 4. Emptying was dysfunctional with a normal PVR, a sustained detrusor contraction present,  abdominal straining present, normal urethral sphincter activity on EMG.  Past Medical History:  Diagnosis Date   DDD (degenerative disc disease), lumbar    Family history of adverse reaction to anesthesia    grandson age 27 stopped breathing stayed in hospital for a few days,no cause found grandson is now 30   GERD (gastroesophageal reflux disease)    Hypertension    Sciatica    right     Past Surgical History:  Procedure Laterality Date   COLONOSCOPY     cologuard negative   CYSTOSCOPY N/A 03/24/2021   Procedure: CYSTOSCOPY;  Surgeon: Gae Dry, MD;  Location: [facility] OR[facility];  Service: Gynecology;  Laterality: N/A;   CYSTOSCOPY  2013   OOPHORECTOMY     OVARIAN CYST REMOVAL Left    age 43   TOTAL LAPAROSCOPIC HYSTERECTOMY WITH BILATERAL SALPINGO OOPHORECTOMY Bilateral 03/24/2021   Procedure: TOTAL LAPAROSCOPIC HYSTERECTOMY WITH BILATERAL SALPINGO OOPHORECTOMY;  Surgeon: Gae Dry, MD;  Location: [facility] OR[facility];  Service: Gynecology;  Laterality: Bilateral;   TUBAL LIGATION     X 2    is allergic to crestor [rosuvastatin calcium].   Family History  Problem Relation Age of Onset   Breast cancer Sister 14    Stroke Mother    Cancer Brother        Melanoma- went to liver   Melanoma Brother    Liver cancer Brother    Liver cancer Brother     Social History   Tobacco Use   Smoking status: Former    Packs/day: 0.50    Years: 15.00    Total pack years: 7.50    Types: Cigarettes    Quit date: 03/23/2002    Years since quitting: 19.8   Smokeless tobacco: Never  Vaping Use   Vaping Use: Never used  Substance Use Topics   Alcohol use: No   Drug use: No     Review of Systems was negative for a full 10 system review except as noted in the History of Present Illness.   Current Outpatient Medications:    acetaminophen (TYLENOL) 500 MG tablet, Take 500-1,000 mg by mouth every 8 (eight) hours as needed for moderate pain., Disp: , Rfl:    atorvastatin (LIPITOR) 20 MG tablet, Take 1 tablet (20 mg total) by mouth daily., Disp: 90 tablet, Rfl: 1   ibuprofen (ADVIL,MOTRIN) 200 MG tablet, Take 400 mg by mouth every 8 (eight) hours as needed for moderate pain., Disp: , Rfl:    lisinopril (ZESTRIL) 10 MG tablet, Take 1 tablet (10 mg total) by mouth daily., Disp: 90 tablet, Rfl: 1   Multiple Vitamins-Minerals (MULTIVITAMIN WITH MINERALS) tablet, Take 1 tablet by mouth daily., Disp: , Rfl:    omeprazole (PRILOSEC) 20 MG capsule, TAKE 1 CAPSULE BY MOUTH EVERY DAY (Patient taking differently: as  needed. TAKE 1 CAPSULE BY MOUTH EVERY DAY), Disp: 90 capsule, Rfl: 1   OVER THE COUNTER MEDICATION, Vitamin b 12 daily, Disp: , Rfl:    Objective Vitals:   01/11/22 0920  BP: 114/78  Pulse: 82    Gen: NAD CV: S1 S2 RRR Lungs: Clear to auscultation bilaterally Abd: soft, nontender   Previous Pelvic Exam showed: POP-Q:    POP-Q   1                                            Aa   1                                           Ba   -3                                              C    5                                            Gh   3                                            Pb   7                                             tvl    -2                                            Ap   -2                                            Bp                                                  D      Assessment/ Plan  Assessment: The patient is a 71 y.o. year old scheduled to undergo Exam under anesthesia, robotic assisted sacrocolpopexy, midurethral sling, cystoscopy. Verbal consent was obtained for these procedures.  Plan: General Surgical Consent: The patient has previously been counseled on alternative treatments, and the decision by the patient and provider was to proceed with the procedure listed above.  For all procedures, there are risks of bleeding, infection, damage to surrounding organs including but not limited to bowel, bladder, blood vessels, ureters and nerves, and need for further surgery if an injury were  to occur. These risks are all low with minimally invasive surgery.   There are risks of numbness and weakness at any body site or buttock/rectal pain.  It is possible that baseline pain can be worsened by surgery, either with or without mesh. If surgery is vaginal, there is also a low risk of possible conversion to laparoscopy or open abdominal incision where indicated. Very rare risks include blood transfusion, blood clot, heart attack, pneumonia, or death.   There is also a risk of short-term postoperative urinary retention with need to use a catheter. About half of patients need to go home from surgery with a catheter, which is then later removed in the office. The risk of long-term need for a catheter is very low. There is also a risk of worsening of overactive bladder.   Sling: The effectiveness of a midurethral vaginal mesh sling is approximately 85%, and thus, there will be times when you may leak urine after surgery, especially if your bladder is full or if you have a strong cough. There is a balance between making the sling tight enough to treat your leakage but not  too tight so that you have long-term difficulty emptying your bladder. A mesh sling will not directly treat overactive bladder/urge incontinence and may worsen it.  There is an FDA safety notification on vaginal mesh procedures for prolapse but NOT mesh slings. We have extensive experience and training with mesh placement and we have close postoperative follow up to identify any potential complications from mesh. It is important to realize that this mesh is a permanent implant that cannot be easily removed. There are rare risks of mesh exposure (2-4%), pain with intercourse (0-7%), and infection (<1%). The risk of mesh exposure if more likely in a woman with risks for poor healing (prior radiation, poorly controlled diabetes, or immunocompromised). The risk of new or worsened chronic pain after mesh implant is more common in women with baseline chronic pain and/or poorly controlled anxiety or depression. Approximately 2-4% of patients will experience longer-term post-operative voiding dysfunction that may require surgical revision of the sling. We also reviewed that postoperatively, her stream may not be as strong as before surgery.    Prolapse (with or without mesh): Risk factors for surgical failure  include things that put pressure on your pelvis and the surgical repair, including obesity, chronic cough, and heavy lifting or straining (including lifting children or adults, straining on the toilet, or lifting heavy objects such as furniture or anything weighing >25 lbs. Risks of recurrence is 20-30% with vaginal native tissue repair and a less than 10% with sacrocolpopexy with mesh.    Sacrocolpopexy: Mesh implants may provide more prolapse support, but do have some unique risks to consider. It is important to understand that mesh is permanent and cannot be easily removed. Risks of abdominal sacrocolpopexy mesh include mesh exposure (~3-6%), painful intercourse (recent studies show lower rates after surgery  compared to before, with ~5-8% risk of new onset), and very rare risks of bowel or bladder injury or infection (<1%). The risk of mesh exposure is more likely in a woman with risks for poor healing (prior radiation, poorly controlled diabetes, or immunocompromised). The risk of new or worsened chronic pain after mesh implant is more common in women with baseline chronic pain and/or poorly controlled anxiety or depression. There is an FDA safety notification on vaginal mesh procedures for prolapse but NOT abdominal mesh procedures and therefore does not apply to your surgery. We have extensive experience  and training with mesh placement and we have close postoperative follow up to identify any potential complications from mesh.    We discussed consent for blood products. Risks for blood transfusion include allergic reactions, other reactions that can affect different body organs and managed accordingly, transmission of infectious diseases such as HIV or Hepatitis. However, the blood is screened. Patient consents for blood products.  Pre-operative instructions:  She was instructed to not take Aspirin/NSAIDs x 7days prior to surgery.  Antibiotic prophylaxis was ordered as indicated.  Cathter use: Patient will go home with foley if needed after post-operative voiding trial.  Post-operative instructions:  She was provided with specific post-operative instructions, including precautions and signs/symptoms for which we would recommend contacting us, in addition to daytime and after-hours contact phone numbers. This was provided on a handout.   Post-operative medications: Prescriptions for motrin, tylenol, miralax, and tramadol were sent to her pharmacy. Discussed using ibuprofen and tylenol on a schedule to limit use of narcotics.   Laboratory testing:  We will check labs: type and screen  Preoperative clearance:  She does not require surgical clearance.    Post-operative follow-up:  A post-operative  appointment will be made for 6 weeks from the date of surgery. If she needs a post-operative nurse visit for a voiding trial, that will be set up after she leaves the hospital.    Patient will call the clinic or use MyChart should anything change or any new issues arise.   [name], MD  Time spent: I spent 20 minutes dedicated to the care of this patient on the date of this encounter to include pre-visit review of records, face-to-face time with the patient  and post visit documentation and ordering medication/ testing.

## 2022-01-24 ENCOUNTER — Other Ambulatory Visit: Payer: Self-pay

## 2022-01-24 ENCOUNTER — Encounter (HOSPITAL_BASED_OUTPATIENT_CLINIC_OR_DEPARTMENT_OTHER)
Admission: RE | Disposition: A | Discharge status: Home / Self Care | Payer: Self-pay | Source: Ambulatory Visit | Attending: Obstetrics and Gynecology

## 2022-01-24 ENCOUNTER — Ambulatory Visit (HOSPITAL_BASED_OUTPATIENT_CLINIC_OR_DEPARTMENT_OTHER): Payer: PPO | Admitting: Anesthesiology

## 2022-01-24 ENCOUNTER — Ambulatory Visit (HOSPITAL_BASED_OUTPATIENT_CLINIC_OR_DEPARTMENT_OTHER)
Admission: RE | Admit: 2022-01-24 | Discharge: 2022-01-25 | Disposition: A | Discharge status: Home / Self Care | Payer: PPO | Source: Ambulatory Visit | Attending: Obstetrics and Gynecology | Admitting: Obstetrics and Gynecology

## 2022-01-24 ENCOUNTER — Encounter (HOSPITAL_BASED_OUTPATIENT_CLINIC_OR_DEPARTMENT_OTHER): Payer: Self-pay | Admitting: Obstetrics and Gynecology

## 2022-01-24 DIAGNOSIS — N993 Prolapse of vaginal vault after hysterectomy: Secondary | ICD-10-CM

## 2022-01-24 DIAGNOSIS — Z01818 Encounter for other preprocedural examination: Secondary | ICD-10-CM

## 2022-01-24 DIAGNOSIS — N8111 Cystocele, midline: Secondary | ICD-10-CM

## 2022-01-24 DIAGNOSIS — N8110 Cystocele, unspecified: Secondary | ICD-10-CM | POA: Diagnosis not present

## 2022-01-24 DIAGNOSIS — N816 Rectocele: Secondary | ICD-10-CM | POA: Insufficient documentation

## 2022-01-24 DIAGNOSIS — N393 Stress incontinence (female) (male): Secondary | ICD-10-CM

## 2022-01-24 DIAGNOSIS — Z90710 Acquired absence of both cervix and uterus: Secondary | ICD-10-CM | POA: Insufficient documentation

## 2022-01-24 HISTORY — PX: ROBOTIC ASSISTED LAPAROSCOPIC SACROCOLPOPEXY: SHX5388

## 2022-01-24 HISTORY — PX: CYSTOSCOPY: SHX5120

## 2022-01-24 HISTORY — PX: BLADDER SUSPENSION: SHX72

## 2022-01-24 LAB — TYPE AND SCREEN
ABO/RH(D): A POS
Antibody Screen: NEGATIVE

## 2022-01-24 SURGERY — SACROCOLPOPEXY, ROBOT-ASSISTED, LAPAROSCOPIC
Anesthesia: General | Site: Vagina

## 2022-01-24 MED ORDER — KETOROLAC TROMETHAMINE 30 MG/ML IJ SOLN
INTRAMUSCULAR | Status: DC | PRN
  Administered 2022-01-24: 30 mg via INTRAVENOUS

## 2022-01-24 MED ORDER — PHENYLEPHRINE 80 MCG/ML (10ML) SYRINGE FOR IV PUSH (FOR BLOOD PRESSURE SUPPORT)
PREFILLED_SYRINGE | INTRAVENOUS | Status: DC | PRN
  Administered 2022-01-24: 240 ug via INTRAVENOUS
  Administered 2022-01-24: 80 ug via INTRAVENOUS

## 2022-01-24 MED ORDER — DEXAMETHASONE SODIUM PHOSPHATE 10 MG/ML IJ SOLN
INTRAMUSCULAR | Status: DC | PRN
  Administered 2022-01-24: 8 mg via INTRAVENOUS

## 2022-01-24 MED ORDER — CEFAZOLIN SODIUM-DEXTROSE 2-4 GM/100ML-% IV SOLN
2.0000 g | INTRAVENOUS | Status: AC
  Administered 2022-01-24: 2 g via INTRAVENOUS

## 2022-01-24 MED ORDER — FENTANYL CITRATE (PF) 100 MCG/2ML IJ SOLN: 25.0000 ug | INTRAMUSCULAR | Status: DC | PRN

## 2022-01-24 MED ORDER — ACETAMINOPHEN 500 MG PO TABS: 1000.0000 mg | ORAL_TABLET | ORAL | Status: AC

## 2022-01-24 MED ORDER — PROPOFOL 10 MG/ML IV BOLUS
INTRAVENOUS | Status: DC | PRN
  Administered 2022-01-24: 160 mg via INTRAVENOUS

## 2022-01-24 MED ORDER — ACETAMINOPHEN 325 MG PO TABS
650.0000 mg | ORAL_TABLET | ORAL | Status: DC | PRN
  Administered 2022-01-24 – 2022-01-25 (×3): 650 mg via ORAL

## 2022-01-24 MED ORDER — DOCUSATE SODIUM 100 MG PO CAPS
100.0000 mg | ORAL_CAPSULE | Freq: Two times a day (BID) | ORAL | Status: DC
  Administered 2022-01-24: 100 mg via ORAL

## 2022-01-24 MED ORDER — PHENAZOPYRIDINE HCL 100 MG PO TABS
200.0000 mg | ORAL_TABLET | ORAL | Status: AC
  Administered 2022-01-24: 200 mg via ORAL

## 2022-01-24 MED ORDER — SCOPOLAMINE 1 MG/3DAYS TD PT72
1.0000 | MEDICATED_PATCH | TRANSDERMAL | Status: DC
  Administered 2022-01-24: 1.5 mg via TRANSDERMAL

## 2022-01-24 MED ORDER — LIDOCAINE-EPINEPHRINE 1 %-1:100000 IJ SOLN
INTRAMUSCULAR | Status: DC | PRN
  Administered 2022-01-24: 10 mL

## 2022-01-24 MED ORDER — POVIDONE-IODINE 10 % EX SWAB: 2.0000 | Freq: Once | CUTANEOUS | Status: DC

## 2022-01-24 MED ORDER — LACTATED RINGERS IV SOLN: INTRAVENOUS | Status: DC

## 2022-01-24 MED ORDER — DOCUSATE SODIUM 100 MG PO CAPS
ORAL_CAPSULE | ORAL | Status: AC
  Filled 2022-01-24: qty 1

## 2022-01-24 MED ORDER — EPHEDRINE SULFATE (PRESSORS) 50 MG/ML IJ SOLN
INTRAMUSCULAR | Status: DC | PRN
  Administered 2022-01-24 (×2): 10 mg via INTRAVENOUS

## 2022-01-24 MED ORDER — SODIUM CHLORIDE 0.9 % IR SOLN
Status: DC | PRN
  Administered 2022-01-24: 1000 mL

## 2022-01-24 MED ORDER — PHENAZOPYRIDINE HCL 100 MG PO TABS
ORAL_TABLET | ORAL | Status: AC
  Filled 2022-01-24: qty 2

## 2022-01-24 MED ORDER — ACETAMINOPHEN 500 MG PO TABS
ORAL_TABLET | ORAL | Status: AC
  Filled 2022-01-24: qty 2

## 2022-01-24 MED ORDER — SUGAMMADEX SODIUM 200 MG/2ML IV SOLN
INTRAVENOUS | Status: DC | PRN
  Administered 2022-01-24: 150 mg via INTRAVENOUS

## 2022-01-24 MED ORDER — DEXMEDETOMIDINE HCL IN NACL 200 MCG/50ML IV SOLN
INTRAVENOUS | Status: DC | PRN
  Administered 2022-01-24: 12 ug via INTRAVENOUS

## 2022-01-24 MED ORDER — ONDANSETRON HCL 4 MG PO TABS: 4.0000 mg | ORAL_TABLET | Freq: Four times a day (QID) | ORAL | Status: DC | PRN

## 2022-01-24 MED ORDER — TRAMADOL HCL 50 MG PO TABS
50.0000 mg | ORAL_TABLET | Freq: Four times a day (QID) | ORAL | Status: DC | PRN
  Administered 2022-01-24 – 2022-01-25 (×3): 50 mg via ORAL

## 2022-01-24 MED ORDER — PHENYLEPHRINE 80 MCG/ML (10ML) SYRINGE FOR IV PUSH (FOR BLOOD PRESSURE SUPPORT)
PREFILLED_SYRINGE | INTRAVENOUS | Status: AC
  Filled 2022-01-24: qty 10

## 2022-01-24 MED ORDER — WATER FOR IRRIGATION, STERILE IR SOLN
Status: DC | PRN
  Administered 2022-01-24: 500 mL

## 2022-01-24 MED ORDER — PROMETHAZINE HCL 25 MG/ML IJ SOLN: 6.2500 mg | INTRAMUSCULAR | Status: DC | PRN

## 2022-01-24 MED ORDER — ONDANSETRON HCL 4 MG/2ML IJ SOLN: 4.0000 mg | Freq: Four times a day (QID) | INTRAMUSCULAR | Status: DC | PRN

## 2022-01-24 MED ORDER — BUPIVACAINE HCL (PF) 0.25 % IJ SOLN
INTRAMUSCULAR | Status: DC | PRN
  Administered 2022-01-24: 13 mL

## 2022-01-24 MED ORDER — FENTANYL CITRATE (PF) 100 MCG/2ML IJ SOLN
INTRAMUSCULAR | Status: DC | PRN
  Administered 2022-01-24: 50 ug via INTRAVENOUS
  Administered 2022-01-24: 25 ug via INTRAVENOUS
  Administered 2022-01-24: 50 ug via INTRAVENOUS

## 2022-01-24 MED ORDER — OXYCODONE HCL 5 MG PO TABS: 5.0000 mg | ORAL_TABLET | ORAL | Status: DC | PRN

## 2022-01-24 MED ORDER — CEFAZOLIN SODIUM 1 G IJ SOLR
INTRAMUSCULAR | Status: AC
  Filled 2022-01-24: qty 20

## 2022-01-24 MED ORDER — SCOPOLAMINE 1 MG/3DAYS TD PT72
MEDICATED_PATCH | TRANSDERMAL | Status: AC
  Filled 2022-01-24: qty 1

## 2022-01-24 MED ORDER — ROCURONIUM BROMIDE 10 MG/ML (PF) SYRINGE
PREFILLED_SYRINGE | INTRAVENOUS | Status: DC | PRN
  Administered 2022-01-24: 90 mg via INTRAVENOUS
  Administered 2022-01-24: 10 mg via INTRAVENOUS

## 2022-01-24 MED ORDER — TRAMADOL HCL 50 MG PO TABS
ORAL_TABLET | ORAL | Status: AC
  Filled 2022-01-24: qty 1

## 2022-01-24 MED ORDER — CEFAZOLIN SODIUM-DEXTROSE 2-4 GM/100ML-% IV SOLN
INTRAVENOUS | Status: AC
  Filled 2022-01-24: qty 100

## 2022-01-24 MED ORDER — FENTANYL CITRATE (PF) 250 MCG/5ML IJ SOLN
INTRAMUSCULAR | Status: AC
  Filled 2022-01-24: qty 5

## 2022-01-24 MED ORDER — AMISULPRIDE (ANTIEMETIC) 5 MG/2ML IV SOLN: 10.0000 mg | Freq: Once | INTRAVENOUS | Status: DC | PRN

## 2022-01-24 MED ORDER — ACETAMINOPHEN 500 MG PO TABS
1000.0000 mg | ORAL_TABLET | Freq: Once | ORAL | Status: AC
  Administered 2022-01-24: 1000 mg via ORAL

## 2022-01-24 MED ORDER — LIDOCAINE 2% (20 MG/ML) 5 ML SYRINGE
INTRAMUSCULAR | Status: DC | PRN
  Administered 2022-01-24: 50 mg via INTRAVENOUS

## 2022-01-24 MED ORDER — ONDANSETRON HCL 4 MG/2ML IJ SOLN
INTRAMUSCULAR | Status: DC | PRN
  Administered 2022-01-24: 4 mg via INTRAVENOUS

## 2022-01-24 MED ORDER — ACETAMINOPHEN 325 MG PO TABS
ORAL_TABLET | ORAL | Status: AC
  Filled 2022-01-24: qty 2

## 2022-01-24 MED ORDER — SIMETHICONE 80 MG PO CHEW: 80.0000 mg | CHEWABLE_TABLET | Freq: Four times a day (QID) | ORAL | Status: DC | PRN

## 2022-01-24 SURGICAL SUPPLY — 89 items
ADH SKN CLS APL DERMABOND .7 (GAUZE/BANDAGES/DRESSINGS) ×3
AGENT HMST KT MTR STRL THRMB (HEMOSTASIS)
APL ESCP 34 STRL LF DISP (HEMOSTASIS)
APL PRP STRL LF DISP 70% ISPRP (MISCELLANEOUS) ×3
APPLICATOR SURGIFLO ENDO (HEMOSTASIS) IMPLANT
BLADE CLIPPER SENSICLIP SURGIC (BLADE) ×3 IMPLANT
BLADE SURG 15 STRL LF DISP TIS (BLADE) ×3 IMPLANT
BLADE SURG 15 STRL SS (BLADE) ×3
CATH FOLEY 3WAY  5CC 16FR (CATHETERS) ×3
CATH FOLEY 3WAY 5CC 16FR (CATHETERS) ×3 IMPLANT
CHLORAPREP W/TINT 26 (MISCELLANEOUS) ×3 IMPLANT
COVER BACK TABLE 60X90IN (DRAPES) ×3 IMPLANT
COVER TIP SHEARS 8 DVNC (MISCELLANEOUS) ×3 IMPLANT
COVER TIP SHEARS 8MM DA VINCI (MISCELLANEOUS) ×3
DECANTER SPIKE VIAL GLASS SM (MISCELLANEOUS) ×6 IMPLANT
DEFOGGER SCOPE WARMER CLEARIFY (MISCELLANEOUS) ×3 IMPLANT
DERMABOND ADVANCED .7 DNX12 (GAUZE/BANDAGES/DRESSINGS) ×3 IMPLANT
DEVICE CAPIO SLIM SINGLE (INSTRUMENTS) IMPLANT
DRAPE ARM DVNC X/XI (DISPOSABLE) ×12 IMPLANT
DRAPE COLUMN DVNC XI (DISPOSABLE) ×3 IMPLANT
DRAPE DA VINCI XI ARM (DISPOSABLE) ×12
DRAPE DA VINCI XI COLUMN (DISPOSABLE) ×3
DRAPE SHEET LG 3/4 BI-LAMINATE (DRAPES) ×3 IMPLANT
DRAPE UTILITY XL STRL (DRAPES) ×3 IMPLANT
ELECT REM PT RETURN 9FT ADLT (ELECTROSURGICAL) ×3
ELECTRODE REM PT RTRN 9FT ADLT (ELECTROSURGICAL) ×3 IMPLANT
GAUZE 4X4 16PLY ~~LOC~~+RFID DBL (SPONGE) ×6 IMPLANT
GLOVE BIO SURGEON STRL SZ 6 (GLOVE) ×9 IMPLANT
GLOVE BIOGEL PI IND STRL 6.5 (GLOVE) ×12 IMPLANT
GLOVE BIOGEL PI IND STRL 7.0 (GLOVE) ×6 IMPLANT
GLOVE ECLIPSE 6.0 STRL STRAW (GLOVE) ×3 IMPLANT
GOWN STRL REUS W/TWL LRG LVL3 (GOWN DISPOSABLE) ×3 IMPLANT
HIBICLENS CHG 4% 4OZ BTL (MISCELLANEOUS) ×3 IMPLANT
HOLDER FOLEY CATH W/STRAP (MISCELLANEOUS) ×3 IMPLANT
IRRIG SUCT STRYKERFLOW 2 WTIP (MISCELLANEOUS) ×3
IRRIGATION SUCT STRKRFLW 2 WTP (MISCELLANEOUS) ×3 IMPLANT
KIT TURNOVER CYSTO (KITS) ×3 IMPLANT
LEGGING LITHOTOMY PAIR STRL (DRAPES) ×3 IMPLANT
MANIFOLD NEPTUNE II (INSTRUMENTS) ×3 IMPLANT
MANIPULATOR ADVINCU DEL 2.5 PL (MISCELLANEOUS) IMPLANT
MANIPULATOR ADVINCU DEL 3.0 PL (MISCELLANEOUS) IMPLANT
MANIPULATOR ADVINCU DEL 3.5 PL (MISCELLANEOUS) IMPLANT
MANIPULATOR ADVINCU DEL 4.0 PL (MISCELLANEOUS) IMPLANT
MESH VERTESSA LITE -Y 2X4X3 (Mesh General) ×3 IMPLANT
NDL INSUFFLATION 14GA 120MM (NEEDLE) ×3 IMPLANT
NEEDLE HYPO 22GX1.5 SAFETY (NEEDLE) ×3 IMPLANT
NEEDLE INSUFFLATION 14GA 120MM (NEEDLE) ×3 IMPLANT
NS IRRIG 1000ML POUR BTL (IV SOLUTION) ×3 IMPLANT
OBTURATOR OPTICAL STANDARD 8MM (TROCAR) ×3
OBTURATOR OPTICAL STND 8 DVNC (TROCAR) ×3
OBTURATOR OPTICALSTD 8 DVNC (TROCAR) ×3 IMPLANT
PACK CYSTO (CUSTOM PROCEDURE TRAY) ×3 IMPLANT
PACK ROBOT WH (CUSTOM PROCEDURE TRAY) ×3 IMPLANT
PACK ROBOTIC GOWN (GOWN DISPOSABLE) ×3 IMPLANT
PACK VAGINAL WOMENS (CUSTOM PROCEDURE TRAY) ×3 IMPLANT
PAD OB MATERNITY 4.3X12.25 (PERSONAL CARE ITEMS) ×3 IMPLANT
PAD POSITIONING PINK XL (MISCELLANEOUS) ×3 IMPLANT
PAD PREP 24X48 CUFFED NSTRL (MISCELLANEOUS) ×3 IMPLANT
POUCH LAPAROSCOPIC INSTRUMENT (MISCELLANEOUS) IMPLANT
PROTECTOR NERVE ULNAR (MISCELLANEOUS) ×3 IMPLANT
RETRACTOR LONE STAR DISPOSABLE (INSTRUMENTS) ×3 IMPLANT
RETRACTOR STAY HOOK 5MM (MISCELLANEOUS) ×3 IMPLANT
SEAL CANN UNIV 5-8 DVNC XI (MISCELLANEOUS) ×15 IMPLANT
SEAL XI 5MM-8MM UNIVERSAL (MISCELLANEOUS) ×15
SEALER VESSEL DA VINCI XI (MISCELLANEOUS)
SEALER VESSEL EXT DVNC XI (MISCELLANEOUS) IMPLANT
SET IRRIG Y TYPE TUR BLADDER L (SET/KITS/TRAYS/PACK) ×3 IMPLANT
SET TUBE SMOKE EVAC HIGH FLOW (TUBING) ×3 IMPLANT
SPONGE T-LAP 4X18 ~~LOC~~+RFID (SPONGE) IMPLANT
SUCTION FRAZIER HANDLE 10FR (MISCELLANEOUS) ×3
SUCTION TUBE FRAZIER 10FR DISP (MISCELLANEOUS) ×3 IMPLANT
SURGIFLO W/THROMBIN 8M KIT (HEMOSTASIS) IMPLANT
SUT ABS MONO DBL WITH NDL 48IN (SUTURE) IMPLANT
SUT GORETEX NAB #0 THX26 36IN (SUTURE) ×3 IMPLANT
SUT MNCRL AB 4-0 PS2 18 (SUTURE) ×3 IMPLANT
SUT MON AB 2-0 SH 27 (SUTURE) ×3 IMPLANT
SUT V-LOC BARB 180 2/0GR6 GS22 (SUTURE) ×6
SUT VIC AB 0 CT1 27 (SUTURE) ×6
SUT VIC AB 0 CT1 27XBRD ANTBC (SUTURE) ×6 IMPLANT
SUT VIC AB 2-0 SH 27 (SUTURE) ×3
SUT VIC AB 2-0 SH 27XBRD (SUTURE) ×3 IMPLANT
SUT VICRYL 2-0 SH 8X27 (SUTURE) IMPLANT
SUT VLOC 180 0 9IN  GS21 (SUTURE)
SUT VLOC 180 0 9IN GS21 (SUTURE) IMPLANT
SUTURE V-LC BRB 180 2/0GR6GS22 (SUTURE) ×6 IMPLANT
SYR BULB EAR ULCER 3OZ GRN STR (SYRINGE) ×3 IMPLANT
SYS SLING ADV FIT BLUE TRNSVAG (Sling) IMPLANT
TOWEL OR 17X26 10 PK STRL BLUE (TOWEL DISPOSABLE) ×3 IMPLANT
TRAY FOLEY W/BAG SLVR 14FR (SET/KITS/TRAYS/PACK) ×3 IMPLANT

## 2022-01-24 NOTE — Progress Notes (Signed)
Called Dr. Wannetta Sender to update her on pt.  Pt c/o pain (9) in vaginal area and extreme pressure in bladder.  Voiding trial  done but pt could only take 120cc of NS through foley.  Foley removed and pt unable to void.  Will assist pt w/in an hour to BR to attempt to void again.

## 2022-01-24 NOTE — Discharge Instructions (Signed)

## 2022-01-24 NOTE — Transfer of Care (Signed)
Immediate Anesthesia Transfer of Care Note  Patient: Makayla [name]  Procedure(s) Performed: XI ROBOTIC ASSISTED LAPAROSCOPIC SACROCOLPOPEXY (Pelvis) TRANSVAGINAL TAPE (TVT) PROCEDURE (Vagina ) CYSTOSCOPY (Bladder)  Patient Location: PACU  Anesthesia Type:General  Level of Consciousness: awake, alert  and patient cooperative  Airway & Oxygen Therapy: Patient Spontanous Breathing and Patient connected to nasal cannula oxygen  Post-op Assessment: Report given to RN and Post -op Vital signs reviewed and stable  Post vital signs: Reviewed and stable  Last Vitals:  Vitals Value Taken Time  BP 107/63 01/24/22 1450  Temp    Pulse 57 01/24/22 1455  Resp 15 01/24/22 1455  SpO2 98 % 01/24/22 1455  Vitals shown include unvalidated device data.  Last Pain:  Vitals:   01/24/22 0931  TempSrc: Oral  PainSc: 0-No pain      Patients Stated Pain Goal: 3 (36/12/24 4975)  Complications: No notable events documented.

## 2022-01-24 NOTE — Anesthesia Preprocedure Evaluation (Addendum)
Anesthesia Evaluation  Patient identified by MRN, date of birth, ID band Patient awake    Reviewed: Allergy & Precautions, NPO status , Patient's Chart, lab work & pertinent test results  History of Anesthesia Complications Negative for: history of anesthetic complications  Airway Mallampati: II  TM Distance: >3 FB Neck ROM: Full    Dental no notable dental hx. (+) Dental Advisory Given   Pulmonary neg pulmonary ROS, former smoker,    Pulmonary exam normal        Cardiovascular hypertension, Pt. on medications Normal cardiovascular exam     Neuro/Psych negative neurological ROS     GI/Hepatic Neg liver ROS, GERD  Medicated and Controlled,  Endo/Other  negative endocrine ROS  Renal/GU negative Renal ROS     Musculoskeletal  (+) Arthritis ,   Abdominal   Peds  Hematology negative hematology ROS (+)   Anesthesia Other Findings   Reproductive/Obstetrics                            Anesthesia Physical Anesthesia Plan  ASA: 2  Anesthesia Plan: General   Post-op Pain Management: Tylenol PO (pre-op)* and Toradol IV (intra-op)*   Induction:   PONV Risk Score and Plan: 4 or greater and Ondansetron, Dexamethasone, Diphenhydramine and Scopolamine patch - Pre-op  Airway Management Planned: Oral ETT  Additional Equipment:   Intra-op Plan:   Post-operative Plan: Extubation in OR  Informed Consent: I have reviewed the patients History and Physical, chart, labs and discussed the procedure including the risks, benefits and alternatives for the proposed anesthesia with the patient or authorized representative who has indicated his/her understanding and acceptance.     Dental advisory given  Plan Discussed with: Anesthesiologist and CRNA  Anesthesia Plan Comments:       Anesthesia Quick Evaluation

## 2022-01-24 NOTE — Anesthesia Postprocedure Evaluation (Signed)
Anesthesia Post Note  Patient: Makayla [name]  Procedure(s) Performed: XI ROBOTIC ASSISTED LAPAROSCOPIC SACROCOLPOPEXY (Pelvis) TRANSVAGINAL TAPE (TVT) PROCEDURE (Vagina ) CYSTOSCOPY (Bladder)     Patient location during evaluation: PACU Anesthesia Type: General Level of consciousness: sedated Pain management: pain level controlled Vital Signs Assessment: post-procedure vital signs reviewed and stable Respiratory status: spontaneous breathing and respiratory function stable Cardiovascular status: stable Postop Assessment: no apparent nausea or vomiting Anesthetic complications: no   No notable events documented.  Last Vitals:  Vitals:   01/24/22 1530 01/24/22 1545  BP: 118/72 119/75  Pulse: 65 85  Resp: 14 (!) 25  Temp: (!) 36.3 C   SpO2: 95% 96%    Last Pain:  Vitals:   01/24/22 1515  TempSrc:   PainSc: 0-No pain                 Elimelech Houseman DANIEL

## 2022-01-24 NOTE — Anesthesia Procedure Notes (Signed)
Procedure Name: Intubation Date/Time: 01/24/2022 11:51 AM  Performed by: Georgeanne Nim, CRNAPre-anesthesia Checklist: Patient identified, Emergency Drugs available, Suction available, Patient being monitored and Timeout performed Patient Re-evaluated:Patient Re-evaluated prior to induction Oxygen Delivery Method: Circle system utilized Preoxygenation: Pre-oxygenation with 100% oxygen Induction Type: IV induction Ventilation: Mask ventilation without difficulty Laryngoscope Size: Mac and 4 Grade View: Grade I Tube type: Oral Placement Confirmation: ETT inserted through vocal cords under direct vision, positive ETCO2, CO2 detector and breath sounds checked- equal and bilateral Secured at: 20 cm Tube secured with: Tape Dental Injury: Teeth and Oropharynx as per pre-operative assessment

## 2022-01-24 NOTE — Op Note (Signed)
Operative Note  Preoperative Diagnosis: anterior vaginal prolapse, posterior vaginal prolapse, vaginal vault prolapse after hysterectomy, and stress urinary incontinence  Postoperative Diagnosis: same  Procedures performed:  Robotic assisted sacrocolpopexy Erenest Blank lite Y), midurethral sling, cystoscopy  Implants:  Implant Name Type Inv. Item Serial No. Manufacturer Lot No. LRB No. Used Action  MESH Valli Glance [id number] Mesh General MESH Holland [id number] N/A 1 Implanted  SYS SLING ADV FIT BLUE TRNSVAG - [id number] Sling SYS SLING ADV FIT BLUE TRNSVAG  Wilkie Aye [id number] N/A 1 Implanted    Attending Surgeon: Sherlene Shams, MD  Assistant Surgeon: Lyman Speller, MD  Anesthesia: General endotracheal  Findings: 1. On vaginal exam, stage II prolapse was noted  2. On laparoscopy, mild adhesions of small bowel to vaginal cuff and of large bowel to right pelvic sidewall.   3. On cystoscopy, normal bladder mucosa and urethra without injury, lesion or foreign body. Brisk bilateral ureteral efflux noted.     Specimens: none  Estimated blood loss: 50 mL  IV fluids: 1000 mL  Urine output: 50 mL  Complications: none  Procedure in Detail:  After informed consent was obtained, the patient was taken to the operating room, where general anesthesia was induced and found to be adequate. She was placed in dorsolithotomy position in yellowfin stirrups. Her hips were noted not to be hyperflexed or hyperextended. Her arms were padded with gel pads and tucked to her sides. Her hands were surrounded by foam. A padded strap was placed across her chest with foam between the pad and her skin. She was noted to be appropriately positioned with all pressure points well padded and off tension. A tilt test showed no slippage. She was prepped and draped in the usual sterile fashion.  A sterile Foley catheter was inserted.   0.25% plain Marcaine was  injected in the umbilicus and an incision was made with a scalpel. A Veress needle was inserted into the incision, CO2 insufflation was started, a low opening pressure was noted, and pneumoperitoneum was obtained. The Veress needle was removed and a 52m robotic trocar was placed. Entry into the peritoneal cavity was confirmed.  After determining placement for the other ports, Local anesthetic was injected at each site and two 8 mm incisions were made for robotic ports at 10 cm lateral to and at the level of the umbilical port. Two additional 8 mm incisions were made 10 cm lateral to these and 30 degrees down followed by 8 mm robotic ports - the right side for an assistant port. All trocars were placed sequentially under direct visualization of the camera. The patient was placed in Trendelenburg. The sacrum appeared to be free of any adhesive disease.The robot was docked on the patient's right side. Monopolar endoshears were placed in the right arm, a [place] bipolar grasper was placed in the 2nd arm of the patient's left side, and a Tip up grasper was placed in the 3rd arm on the patient's left side.    With a lucite probe in the vagina, the apex/ posterior area of the vaginal cuff appeared very thin and the stent was visualized through the cuff. The bladder was backfilled. Anterior vaginal dissection was then performed with sharp dissection and electrosurgery to separate the vesicovaginal space to the level of the urethra. The posterior vaginal dissection was then performed with sharp dissection and electrosurgery in order to dissect the rectum away from the posterior vagina. The  Attention was then  turned to the sacral promontory. The peritoneum overlying the sacral promontory was tented up, dissected sharply with monopolar scissors and electrosurgery using layer by layer technique. The overlying areolar and adipose tissue were taken down until the anterior longitudinal sacral ligament was identified. The  peritoneal incision was extended down to the posterior cul-de-sac. This was performed with care to avoid the ureter on the right side and the sigmoid colon and its mesentary on the left side.   Small vessels were cauterized along the way to obtain excellent hemostasis. The thin area of the vaginal cuff was oversewn in two imbricating layers with 2-0 v-loc suture. A "Y" mesh was then inserted into the abdomen after trimming to appropriate size. With the probe in the vagina, the anterior leaf of the Y mesh was affixed to the anterior portion of the vagina using a 2-0 v-loc suture in a spiral pattern to distribute the suture evenly across the surface of the anterior mesh leaf. In a similar fashion, the posterior leaf of the Y mesh was attached to the posterior surface of the vagina with 2-0 v-loc suture.  The distal end of the mesh was then brought to overlie the sacrum. The correct amount of tension was determined in order to elevate the vagina, but not put the mesh under tension. The distal end of the mesh was then affixed to the anterior longitudinal sacral ligament using two interrupted transverse stitches of CV2 Gortex. The excess distal mesh was then cut and removed. The peritoneum was reapproximated over the mesh using 2-0 monocryl. The bladder flap was incorporated to completely retroperitonealize the mesh. All pedicles were carefully inspected and noted to be hemostatic as the CO2 gas was deflated. All instruments were removed from the patient's abdomen.    The Foley catheter was removed.  A 70-degree cystoscope was introduced, and 360-degree inspection revealed no injury, lesion or foreign body in the bladder. Brisk bilateral ureteral efflux was noted with the assistance of pyridium.  The bladder was drained and the cystoscope was removed.  The Foley catheter was replaced.   The robot was undocked. The CO2 gas was removed and the ports were removed.  The skin incisions were closed with subcutaneous  stitches of 4-0 Monocryl and covered with skin glue.    Attention was then turned to the sling. The mid urethral area was located on the anterior vaginal wall.  Two Allis clamps were placed at the level of the midurethra. 1% lidocaine with epinephrine was injected into the vaginal mucosa. A vertical incision was made between the two clamps using a 15-blade scalpel.  Using sharp dissection, Metzenbaum scissors were used to make a periurethral tunnel from the vaginal incision towards the pubic rami bilaterally for the future sling tracts. The bladder was ensured to be empty. The trocar and attached sling were introduced into the right side of the periurethral vaginal incision, just inferior to the pubic symphysis on the right side. The trocar was guided through the endopelvic fascia and directly vertically.  While hugging the cephalad surface of the pubic bone, the trocar was guided out through the abdomen 2 fingerbreadths lateral to midline at the level of the pubic symphysis on the ipsilateral side. The trocar was placed on the left side in a similar fashion.  A 70-degree cystoscope was introduced, and 360-degree inspection revealed no trauma or trocars in the bladder, with brisk bilateral ureteral efflux.  The bladder was drained and the cystoscope was removed.  The Foley catheter was reinserted.  The sling was brought to lie beneath the mid-urethra.  A needle driver was placed behind the sling to ensure no tension.   The plastic sheath was removed from the sling and the distal ends of the sling were trimmed just below the level of the skin incisions.  Tension-free positioning of the sling was confirmed. Vaginal inspection revealed no vaginotomy or sling perforations of the mucosa.  The vaginal mucosal edges were reapproximated using 2-0 Vicryl.  The vagina was copiously irrigated.  Hemostasis was again noted. Vaginal packing not placed. The suprapubic sling incisions were closed with Dermabond. The patient  tolerated the procedure well.  She was awakened from anesthesia and transferred to the recovery room in stable condition. All needle and sponge counts were correct x 2.    [name], MD  Dr Ammie Ferrier assistance was required due to the complexity of the case and need for skilled assistant.

## 2022-01-24 NOTE — Interval H&P Note (Signed)
History and Physical Interval Note:  01/24/2022 11:13 AM  Makayla [name]  has presented today for surgery, with the diagnosis of anterior vaginal prolapse; prolapse of vaginal vault after hysterectomy; stress urinary incontinence.  The various methods of treatment have been discussed with the patient and family. After consideration of risks, benefits and other options for treatment, the patient has consented to  Procedure(s) with comments: XI ROBOTIC ASSISTED LAPAROSCOPIC SACROCOLPOPEXY (N/A)  TRANSVAGINAL TAPE (TVT) PROCEDURE (N/A) CYSTOSCOPY (N/A) as a surgical intervention.  The patient's history has been reviewed, patient examined, no change in status, stable for surgery.  I have reviewed the patient's chart and labs.  Questions were answered to the patient's satisfaction.     [name]

## 2022-01-24 NOTE — Progress Notes (Signed)
Pt up to BR w/ asst and voided 50cc clear, orange urine.  Bladder scan total 4cc.  Encouraged po fluids.  Pt walked to door and back to bed but continues to c/o "room spinning a little".

## 2022-01-25 ENCOUNTER — Encounter (HOSPITAL_BASED_OUTPATIENT_CLINIC_OR_DEPARTMENT_OTHER): Payer: Self-pay | Admitting: Obstetrics and Gynecology

## 2022-01-25 DIAGNOSIS — N393 Stress incontinence (female) (male): Secondary | ICD-10-CM | POA: Diagnosis not present

## 2022-01-25 MED ORDER — TRAMADOL HCL 50 MG PO TABS
ORAL_TABLET | ORAL | Status: AC
  Filled 2022-01-25: qty 1

## 2022-01-25 MED ORDER — ESTRADIOL 0.1 MG/GM VA CREA: TOPICAL_CREAM | VAGINAL | 1 refills | Status: DC

## 2022-01-25 MED ORDER — ACETAMINOPHEN 325 MG PO TABS
ORAL_TABLET | ORAL | Status: AC
  Filled 2022-01-25: qty 2

## 2022-01-25 NOTE — Discharge Summary (Signed)
Physician Discharge Summary  Patient ID: Makayla [name] MRN: [id number] DOB/AGE: 69-Jul-1952 69 y.o.  Admit date: 01/24/2022 Discharge date: 01/25/2022  Admission Diagnoses:  Discharge Diagnoses:  Principal Problem:   Vaginal vault prolapse after hysterectomy   Discharged Condition: good  Hospital Course: [phone] F underwent RA- sacrcolpopexy, sling and cystoscopy on 01/24/22. Post operatively, she was able to ambulate, tolerate regular diet and was voiding without difficulty.   Consults: None  Significant Diagnostic Studies: none  Treatments: surgery: see above  Discharge Exam: Blood pressure 122/75, pulse 80, temperature 98.3 F (36.8 C), resp. rate 18, height '5\' 6"'$  (1.676 m), weight 71.7 kg, SpO2 95 %. General appearance: alert and cooperative Resp: normal respirations GI: soft, non-tender; bowel sounds normal; no masses,  no organomegaly Extremities: extremities normal, atraumatic, no cyanosis or edema  Disposition: Discharge disposition: 01-Home or Self Care       Discharge Instructions     Call MD for:  persistant nausea and vomiting   Complete by: As directed    Call MD for:  persistant nausea and vomiting   Complete by: As directed    Call MD for:  redness, tenderness, or signs of infection (pain, swelling, redness, odor or green/yellow discharge around incision site)   Complete by: As directed    Call MD for:  redness, tenderness, or signs of infection (pain, swelling, redness, odor or green/yellow discharge around incision site)   Complete by: As directed    Call MD for:  severe uncontrolled pain   Complete by: As directed    Call MD for:  severe uncontrolled pain   Complete by: As directed    Call MD for:  temperature >100.4   Complete by: As directed    Call MD for:  temperature >100.4   Complete by: As directed    Diet general   Complete by: As directed    Diet general   Complete by: As directed    Increase activity slowly   Complete by: As  directed    Increase activity slowly   Complete by: As directed    May walk up steps   Complete by: As directed    May walk up steps   Complete by: As directed    No wound care   Complete by: As directed    No wound care   Complete by: As directed       Allergies as of 01/25/2022       Reactions   Crestor [rosuvastatin Calcium] Other (See Comments)   Headache        Medication List     TAKE these medications    acetaminophen 500 MG tablet Commonly known as: TYLENOL Take 500-1,000 mg by mouth every 8 (eight) hours as needed for moderate pain.   acetaminophen 500 MG tablet Commonly known as: TYLENOL Take 1 tablet (500 mg total) by mouth every 6 (six) hours as needed (pain). Notes to patient: May take next dose at 12:00 pm   atorvastatin 20 MG tablet Commonly known as: LIPITOR Take 1 tablet (20 mg total) by mouth daily.   estradiol 0.1 MG/GM vaginal cream Commonly known as: ESTRACE Place 0.5g (fingertip amount) nightly for two weeks then twice a week after. Notes to patient: Start next week   ibuprofen 200 MG tablet Commonly known as: ADVIL Take 400 mg by mouth every 8 (eight) hours as needed for moderate pain.   ibuprofen 600 MG tablet Commonly known as: ADVIL Take 1 tablet (600 mg total)  by mouth every 6 (six) hours as needed.   lisinopril 10 MG tablet Commonly known as: ZESTRIL Take 1 tablet (10 mg total) by mouth daily.   multivitamin with minerals tablet Take 1 tablet by mouth daily.   omeprazole 20 MG capsule Commonly known as: PRILOSEC TAKE 1 CAPSULE BY MOUTH EVERY DAY What changed:  when to take this reasons to take this   OVER THE COUNTER MEDICATION Vitamin b 12 daily   polyethylene glycol powder 17 GM/SCOOP powder Commonly known as: GLYCOLAX/MIRALAX Take 17 g by mouth daily. Drink 17g (1 scoop) dissolved in water per day.         Signed: [name] 01/25/2022, 8:44 AM

## 2022-01-26 ENCOUNTER — Encounter: Payer: Self-pay | Admitting: *Deleted

## 2022-01-27 ENCOUNTER — Ambulatory Visit (INDEPENDENT_AMBULATORY_CARE_PROVIDER_SITE_OTHER): Payer: BC Managed Care – PPO | Admitting: Obstetrics and Gynecology

## 2022-01-27 ENCOUNTER — Encounter: Payer: Self-pay | Admitting: Obstetrics and Gynecology

## 2022-01-27 VITALS — BP 118/74 | HR 79

## 2022-01-27 DIAGNOSIS — Z9889 Other specified postprocedural states: Secondary | ICD-10-CM

## 2022-01-27 NOTE — Progress Notes (Signed)
[facility]  Date of Visit: 01/27/2022  History of Present Illness: Makayla [name] is a 71 y.o. female scheduled today for a post-operative visit.   Surgery: s/p Robotic assisted sacrocolpopexy Erenest Blank lite Y), midurethral sling, cystoscopy on 01/24/22  She passed her postoperative void trial.   She noticed some bleeding from one of there incisions yesterday, has improved today. She is concerned that the sutures came out. Overall she is doing well. Has some soreness but pain is well controlled with medication. Had a BM today. Tolerating her diet but does not have a big appetite.  Medications: She has a current medication list which includes the following prescription(s): acetaminophen, acetaminophen, atorvastatin, estradiol, ibuprofen, ibuprofen, lisinopril, multivitamin with minerals, omeprazole, OVER THE COUNTER MEDICATION, and polyethylene glycol powder.   Allergies: Patient is allergic to crestor [rosuvastatin calcium].   Physical Exam: BP 118/74   Pulse 79   Abdomen: soft, non-tender, without masses or organomegaly Laparoscopic Incisions: healing well, incision that is to the right of midline has a small separation (<18m), with no current drainage or bleeding. Other incisions are c/d/i.  Bruising over suprapubic area. Suprapubic incisions are intact with no drainage.   POP-Q: deferred  ---------------------------------------------------------  Assessment and Plan:  1. Post-operative state     - Healing well, reassurance provided - continue with post-op restrictions.   Follow up 5 weeks for post op visit.   MJaquita Folds MD

## 2022-01-31 ENCOUNTER — Ambulatory Visit: Payer: Self-pay

## 2022-01-31 NOTE — Progress Notes (Unsigned)
Established Patient Office Visit  Name: Makayla [name]   MRN: [id number]    DOB: 1950/09/01   Date:02/01/2022  Today's Provider: Talitha Givens, MHS, PA-C Introduced myself to the patient as a PA-C and provided education on APPs in clinical practice.         Subjective  Chief Complaint  Chief Complaint  Patient presents with   Sore Throat    Just has surgery on 9/12. Seems better today. Patient would like for you to take a look and make sure throat is ok. Hurts in the morning and in evening. Pain seems to go away through out the day.    HPI  Sore throat following surgery on 01/24/22  Reports she wakes up in the AM with sore throat States it resolves a bit throughout the day but returns at night States in the AM she has some trouble swallowing Reports she has had a dry cough since surgery and feels like she has congestion in her throat Interventions: Ibuprofen and Tylenol alternating,    Patient Active Problem List   Diagnosis Date Noted   Vaginal vault prolapse after hysterectomy 01/24/2022   Senile purpura ([place]) 09/02/2021   S/P laparoscopic hysterectomy 04/04/2021   Uterine prolapse 02/28/2021   Hyperlipidemia 06/20/2017   History of tobacco abuse 06/19/2017   Chronic low back pain 03/23/2017   Benign hypertensive renal disease     Past Surgical History:  Procedure Laterality Date   BLADDER SUSPENSION N/A 01/24/2022   Procedure: TRANSVAGINAL TAPE (TVT) PROCEDURE;  Surgeon: [name], MD;  Location: [facility];  Service: Gynecology;  Laterality: N/A;   COLONOSCOPY     cologuard negative   CYSTOSCOPY N/A 03/24/2021   Procedure: CYSTOSCOPY;  Surgeon: Gae Dry, MD;  Location: [facility];  Service: Gynecology;  Laterality: N/A;   CYSTOSCOPY  2013   CYSTOSCOPY N/A 01/24/2022   Procedure: CYSTOSCOPY;  Surgeon: [name], MD;  Location: [facility];  Service: Gynecology;  Laterality: N/A;   OOPHORECTOMY      OVARIAN CYST REMOVAL Left    age 59   ROBOTIC ASSISTED LAPAROSCOPIC SACROCOLPOPEXY N/A 01/24/2022   Procedure: XI ROBOTIC ASSISTED LAPAROSCOPIC SACROCOLPOPEXY;  Surgeon: [name], MD;  Location: [facility];  Service: Gynecology;  Laterality: N/A;  total time requested is 3 hours   TOTAL LAPAROSCOPIC HYSTERECTOMY WITH BILATERAL SALPINGO OOPHORECTOMY Bilateral 03/24/2021   Procedure: TOTAL LAPAROSCOPIC HYSTERECTOMY WITH BILATERAL SALPINGO OOPHORECTOMY;  Surgeon: Gae Dry, MD;  Location: [facility];  Service: Gynecology;  Laterality: Bilateral;   TUBAL LIGATION     X 2    Family History  Problem Relation Age of Onset   Breast cancer Sister 23   Stroke Mother    Cancer Brother        Melanoma- went to liver   Melanoma Brother    Liver cancer Brother    Liver cancer Brother     Social History   Tobacco Use   Smoking status: Former    Packs/day: 0.50    Years: 15.00    Total pack years: 7.50    Types: Cigarettes    Quit date: 03/23/2002    Years since quitting: 19.8   Smokeless tobacco: Never  Substance Use Topics   Alcohol use: No     Current Outpatient Medications:    acetaminophen (TYLENOL) 500 MG tablet, Take 500-1,000 mg by mouth every 8 (eight) hours as needed  for moderate pain., Disp: , Rfl:    acetaminophen (TYLENOL) 500 MG tablet, Take 1 tablet (500 mg total) by mouth every 6 (six) hours as needed (pain)., Disp: 30 tablet, Rfl: 0   atorvastatin (LIPITOR) 20 MG tablet, Take 1 tablet (20 mg total) by mouth daily., Disp: 90 tablet, Rfl: 1   estradiol (ESTRACE) 0.1 MG/GM vaginal cream, Place 0.5g (fingertip amount) nightly for two weeks then twice a week after., Disp: 42.5 g, Rfl: 1   ibuprofen (ADVIL) 600 MG tablet, Take 1 tablet (600 mg total) by mouth every 6 (six) hours as needed., Disp: 30 tablet, Rfl: 0   ibuprofen (ADVIL,MOTRIN) 200 MG tablet, Take 400 mg by mouth every 8 (eight) hours as needed for moderate pain., Disp: , Rfl:     lisinopril (ZESTRIL) 10 MG tablet, Take 1 tablet (10 mg total) by mouth daily., Disp: 90 tablet, Rfl: 1   Multiple Vitamins-Minerals (MULTIVITAMIN WITH MINERALS) tablet, Take 1 tablet by mouth daily., Disp: , Rfl:    omeprazole (PRILOSEC) 20 MG capsule, TAKE 1 CAPSULE BY MOUTH EVERY DAY (Patient taking differently: as needed. TAKE 1 CAPSULE BY MOUTH EVERY DAY), Disp: 90 capsule, Rfl: 1   OVER THE COUNTER MEDICATION, Vitamin b 12 daily, Disp: , Rfl:    polyethylene glycol powder (GLYCOLAX/MIRALAX) 17 GM/SCOOP powder, Take 17 g by mouth daily. Drink 17g (1 scoop) dissolved in water per day., Disp: 255 g, Rfl: 0  Allergies  Allergen Reactions   Crestor [Rosuvastatin Calcium] Other (See Comments)    Headache    I personally reviewed active problem list, medication list, notes from last encounter, lab results with the patient/caregiver today.   Review of Systems  Constitutional:  Negative for chills and fever.  HENT:  Positive for congestion and sore throat. Negative for sinus pain.   Respiratory:  Positive for cough. Negative for sputum production.       Objective  Vitals:   02/01/22 0937  BP: 114/77  Pulse: 74  Temp: 98 F (36.7 C)  TempSrc: Oral  SpO2: 98%  Weight: 161 lb 8 oz (73.3 kg)  Height: 5' 5.98" (1.676 m)    Body mass index is 26.08 kg/m.  Physical Exam Vitals reviewed.  Constitutional:      Appearance: She is well-developed and normal weight.  HENT:     Head: Normocephalic and atraumatic.     Mouth/Throat:     Mouth: Mucous membranes are moist. No oral lesions.     Dentition: Normal dentition. No gum lesions.     Tongue: No lesions.     Pharynx: Oropharynx is clear. Uvula midline. No pharyngeal swelling, oropharyngeal exudate, posterior oropharyngeal erythema or uvula swelling.     Tonsils: No tonsillar exudate or tonsillar abscesses.  Eyes:     Conjunctiva/sclera: Conjunctivae normal.  Neck:     Trachea: Phonation normal.  Pulmonary:     Effort:  Pulmonary effort is normal.  Musculoskeletal:     Cervical back: Normal range of motion.  Neurological:     General: No focal deficit present.     Mental Status: She is alert and oriented to person, place, and time.  Psychiatric:        Mood and Affect: Mood normal.        Behavior: Behavior normal.      Recent Results (from the past 2160 hour(s))  POCT Urinalysis Dipstick     Status: None   Collection Time: 12/07/21  9:50 AM  Result Value Ref Range  Color, UA yellow    Clarity, UA clear    Glucose, UA Negative Negative   Bilirubin, UA neg    Ketones, UA neg    Spec Grav, UA 1.020 1.010 - 1.025   Blood, UA trace    pH, UA 6.0 5.0 - 8.0   Protein, UA Negative Negative   Urobilinogen, UA 0.2 0.2 or 1.0 E.U./dL   Nitrite, UA neg    Leukocytes, UA Negative Negative   Appearance normal    Odor none   Type and screen [facility]     Status: None   Collection Time: 01/24/22  9:40 AM  Result Value Ref Range   ABO/RH(D) A POS    Antibody Screen NEG    Sample Expiration      01/27/2022,2359 Performed at [facility], [address] [place], [place] [address]      PHQ2/9:    02/01/2022    9:40 AM 11/29/2021   10:44 AM 09/30/2021    8:38 AM 09/02/2021    8:14 AM 11/19/2020   10:32 AM  Depression screen PHQ 2/9  Decreased Interest 0 0 0 0 0  Down, Depressed, Hopeless 0 0 0 0 0  PHQ - 2 Score 0 0 0 0 0  Altered sleeping 0 0 0 0   Tired, decreased energy 0 0 0 1   Change in appetite 0 0 0 0   Feeling bad or failure about yourself  0 0 0 0   Trouble concentrating 0 0 0 0   Moving slowly or fidgety/restless 0 0 0 0   Suicidal thoughts 0 0 0 0   PHQ-9 Score 0 0 0 1   Difficult doing work/chores  Not difficult at all Not difficult at all        Fall Risk:    02/01/2022    9:40 AM 11/29/2021   10:38 AM 09/02/2021    8:14 AM 11/19/2020   10:32 AM 09/01/2020    8:07 AM  Fall Risk   Falls in the past year? 0 0 0 0 0  Number falls in past  yr: 0  0  0  Injury with Fall? 0 0 0  0  Risk for fall due to : No Fall Risks  No Fall Risks Medication side effect No Fall Risks  Follow up Falls evaluation completed Falls evaluation completed;Education provided;Falls prevention discussed Falls evaluation completed Falls evaluation completed;Education provided;Falls prevention discussed Falls evaluation completed      Functional Status Survey:      Assessment & Plan  Problem List Items Addressed This Visit   None Visit Diagnoses     Sore throat    -  Primary Acute, new concern Suspect this is likely secondary to recent intubation for surgery PE was reassuring - no evidence of swelling, exudate, erythema  Provided reassurance that sore throat and discomfort can be expected after a surgery with intubation  Recommend she use OTC antihistamines for lingering congestion and post nasal drip Follow up as needed for persistent or progressing symptoms        No follow-ups on file.   I, Modestine Scherzinger E Kooper Chriswell, PA-C, have reviewed all documentation for this visit. The documentation on 02/01/22 for the exam, diagnosis, procedures, and orders are all accurate and complete.   Talitha Givens, MHS, PA-C [facility]

## 2022-01-31 NOTE — Telephone Encounter (Signed)
  Chief Complaint: Sore throat post sx Symptoms: Pain left side of throat Frequency: 01/24/2022 Pertinent Negatives: Patient denies fever Disposition: '[]'$ ED /'[]'$ Urgent Care (no appt availability in office) / '[x]'$ Appointment(In office/virtual)/ '[]'$  [place] [facility]/ '[]'$ Home Care/ '[]'$ Refused Recommended Disposition /'[]'$ [facility]/ '[]'$  Follow-up with PCP Additional Notes: PT had Sx 01/24/2022 and continues to have left sided throat pain. Pt is currently using salt water rinses and pain medication.    Reason for Disposition  [1] Sore throat is the only symptom AND [2] present > 48 hours  Answer Assessment - Initial Assessment Questions 1. ONSET: "When did the throat start hurting?" (Hours or days ago)      01/24/2022 2. SEVERITY: "How bad is the sore throat?" (Scale 1-10; mild, moderate or severe)   - MILD (1-3):  Doesn't interfere with eating or normal activities.   - MODERATE (4-7): Interferes with eating some solids and normal activities.   - SEVERE (8-10):  Excruciating pain, interferes with most normal activities.   - SEVERE WITH DYSPHAGIA (10): Can't swallow liquids, drooling.     5/10 3. STREP EXPOSURE: "Has there been any exposure to strep within the past week?" If Yes, ask: "What type of contact occurred?"       4.  VIRAL SYMPTOMS: "Are there any symptoms of a cold, such as a runny nose, cough, hoarse voice or red eyes?"       5. FEVER: "Do you have a fever?" If Yes, ask: "What is your temperature, how was it measured, and when did it start?"     no 6. PUS ON THE TONSILS: "Is there pus on the tonsils in the back of your throat?"      7. OTHER SYMPTOMS: "Do you have any other symptoms?" (e.g., difficulty breathing, headache, rash)      8. PREGNANCY: "Is there any chance you are pregnant?" "When was your last menstrual period?"  Protocols used: Sore Throat-A-AH

## 2022-02-01 ENCOUNTER — Encounter: Payer: Self-pay | Admitting: Physician Assistant

## 2022-02-01 ENCOUNTER — Ambulatory Visit (INDEPENDENT_AMBULATORY_CARE_PROVIDER_SITE_OTHER): Payer: PPO | Admitting: Physician Assistant

## 2022-02-01 VITALS — BP 114/77 | HR 74 | Temp 98.0°F | Ht 65.98 in | Wt 161.5 lb

## 2022-02-01 DIAGNOSIS — J029 Acute pharyngitis, unspecified: Secondary | ICD-10-CM | POA: Diagnosis not present

## 2022-02-22 DIAGNOSIS — M25521 Pain in right elbow: Secondary | ICD-10-CM | POA: Diagnosis not present

## 2022-02-22 DIAGNOSIS — M7711 Lateral epicondylitis, right elbow: Secondary | ICD-10-CM | POA: Diagnosis not present

## 2022-03-06 ENCOUNTER — Encounter: Payer: Self-pay | Admitting: Family Medicine

## 2022-03-06 ENCOUNTER — Ambulatory Visit (INDEPENDENT_AMBULATORY_CARE_PROVIDER_SITE_OTHER): Payer: PPO | Admitting: Family Medicine

## 2022-03-06 VITALS — BP 103/70 | HR 57 | Temp 97.5°F | Wt 160.2 lb

## 2022-03-06 DIAGNOSIS — E782 Mixed hyperlipidemia: Secondary | ICD-10-CM | POA: Diagnosis not present

## 2022-03-06 DIAGNOSIS — I129 Hypertensive chronic kidney disease with stage 1 through stage 4 chronic kidney disease, or unspecified chronic kidney disease: Secondary | ICD-10-CM

## 2022-03-06 DIAGNOSIS — Z23 Encounter for immunization: Secondary | ICD-10-CM | POA: Diagnosis not present

## 2022-03-06 MED ORDER — LISINOPRIL 10 MG PO TABS: 10.0000 mg | ORAL_TABLET | Freq: Every day | ORAL | 1 refills | Status: DC

## 2022-03-06 MED ORDER — ATORVASTATIN CALCIUM 20 MG PO TABS: 20.0000 mg | ORAL_TABLET | Freq: Every day | ORAL | 1 refills | Status: DC

## 2022-03-06 NOTE — Progress Notes (Signed)
BP 103/70   Pulse (!) 57   Temp (!) 97.5 F (36.4 C)   Wt 160 lb 3.2 oz (72.7 kg)   SpO2 99%   BMI 25.87 kg/m    Subjective:    Patient ID: Makayla [name], female    DOB: Apr 07, 1951, 71 y.o.   MRN: [id number]  HPI: Makayla [name] is a 71 y.o. female  Chief Complaint  Patient presents with  . Hypertension  . Hyperlipidemia   HYPERTENSION / HYPERLIPIDEMIA Satisfied with current treatment? yes Duration of hypertension: chronic BP monitoring frequency: rarely BP medication side effects: no Past BP meds: lisinopril Duration of hyperlipidemia: chronic Cholesterol medication side effects: no Cholesterol supplements: none Past cholesterol medications: atorvastatin Medication compliance: excellent compliance Aspirin: no Recent stressors: no Recurrent headaches: no Visual changes: no Palpitations: no Dyspnea: no Chest pain: no Lower extremity edema: no Dizzy/lightheaded: yes  Relevant past medical, surgical, family and social history reviewed and updated as indicated. Interim medical history since our last visit reviewed. Allergies and medications reviewed and updated.  Review of Systems  Constitutional: Negative.   Respiratory: Negative.    Cardiovascular: Negative.   Gastrointestinal: Negative.   Musculoskeletal: Negative.   Neurological: Negative.   Psychiatric/Behavioral: Negative.      Per HPI unless specifically indicated above     Objective:    BP 103/70   Pulse (!) 57   Temp (!) 97.5 F (36.4 C)   Wt 160 lb 3.2 oz (72.7 kg)   SpO2 99%   BMI 25.87 kg/m   Wt Readings from Last 3 Encounters:  03/06/22 160 lb 3.2 oz (72.7 kg)  02/01/22 161 lb 8 oz (73.3 kg)  01/24/22 158 lb 1.6 oz (71.7 kg)    Physical Exam Vitals and nursing note reviewed.  Constitutional:      General: She is not in acute distress.    Appearance: Normal appearance. She is normal weight. She is not ill-appearing, toxic-appearing or diaphoretic.  HENT:     Head:  Normocephalic and atraumatic.     Right Ear: External ear normal.     Left Ear: External ear normal.     Nose: Nose normal.     Mouth/Throat:     Mouth: Mucous membranes are moist.     Pharynx: Oropharynx is clear.  Eyes:     General: No scleral icterus.       Right eye: No discharge.        Left eye: No discharge.     Extraocular Movements: Extraocular movements intact.     Conjunctiva/sclera: Conjunctivae normal.     Pupils: Pupils are equal, round, and reactive to light.  Cardiovascular:     Rate and Rhythm: Normal rate and regular rhythm.     Pulses: Normal pulses.     Heart sounds: Normal heart sounds. No murmur heard.    No friction rub. No gallop.  Pulmonary:     Effort: Pulmonary effort is normal. No respiratory distress.     Breath sounds: Normal breath sounds. No stridor. No wheezing, rhonchi or rales.  Chest:     Chest wall: No tenderness.  Musculoskeletal:        General: Normal range of motion.     Cervical back: Normal range of motion and neck supple.  Skin:    General: Skin is warm and dry.     Capillary Refill: Capillary refill takes less than 2 seconds.     Coloration: Skin is not jaundiced or pale.  Findings: No bruising, erythema, lesion or rash.  Neurological:     General: No focal deficit present.     Mental Status: She is alert and oriented to person, place, and time. Mental status is at baseline.  Psychiatric:        Mood and Affect: Mood normal.        Behavior: Behavior normal.        Thought Content: Thought content normal.        Judgment: Judgment normal.    Results for orders placed or performed during the hospital encounter of 01/24/22  Type and screen [facility]  Result Value Ref Range   ABO/RH(D) A POS    Antibody Screen NEG    Sample Expiration      01/27/2022,2359 Performed at [facility], [place] [address] [place], [place] [address]       Assessment & Plan:   Problem List Items Addressed  This Visit       Genitourinary   Benign hypertensive renal disease - Primary    Under good control on current regimen. Continue current regimen. Continue to monitor. Call with any concerns. Refills given. Labs drawn today.        Relevant Orders   Comprehensive metabolic panel     Other   Hyperlipidemia    Under good control on current regimen. Continue current regimen. Continue to monitor. Call with any concerns. Refills given. Labs drawn today.        Relevant Medications   atorvastatin (LIPITOR) 20 MG tablet   lisinopril (ZESTRIL) 10 MG tablet   Other Relevant Orders   Comprehensive metabolic panel   Lipid Panel w/o Chol/HDL Ratio   Other Visit Diagnoses     Need for influenza vaccination       Relevant Orders   Flu Vaccine QUAD High Dose(Fluad)        Follow up plan: Return in about 6 months (around 09/05/2022) for physical.

## 2022-03-06 NOTE — Assessment & Plan Note (Signed)
Under good control on current regimen. Continue current regimen. Continue to monitor. Call with any concerns. Refills given. Labs drawn today.   

## 2022-03-08 LAB — COMPREHENSIVE METABOLIC PANEL
ALT: 15 IU/L (ref 0–32)
AST: 16 IU/L (ref 0–40)
Albumin/Globulin Ratio: 2.3 — ABNORMAL HIGH (ref 1.2–2.2)
Albumin: 4.4 g/dL (ref 3.9–4.9)
Alkaline Phosphatase: 87 IU/L (ref 44–121)
BUN/Creatinine Ratio: 19 (ref 12–28)
BUN: 15 mg/dL (ref 8–27)
Bilirubin Total: 0.3 mg/dL (ref 0.0–1.2)
CO2: 23 mmol/L (ref 20–29)
Calcium: 9.5 mg/dL (ref 8.7–10.3)
Chloride: 99 mmol/L (ref 96–106)
Creatinine, Ser: 0.79 mg/dL (ref 0.57–1.00)
Globulin, Total: 1.9 g/dL (ref 1.5–4.5)
Glucose: 92 mg/dL (ref 70–99)
Potassium: 4 mmol/L (ref 3.5–5.2)
Sodium: 139 mmol/L (ref 134–144)
Total Protein: 6.3 g/dL (ref 6.0–8.5)
eGFR: 81 mL/min/{1.73_m2} (ref >59)

## 2022-03-08 LAB — LIPID PANEL W/O CHOL/HDL RATIO
Cholesterol, Total: 191 mg/dL (ref 100–199)
HDL: 78 mg/dL (ref >39)
LDL Chol Calc (NIH): 93 mg/dL (ref 0–99)
Triglycerides: 116 mg/dL (ref 0–149)
VLDL Cholesterol Cal: 20 mg/dL (ref 5–40)

## 2022-03-15 ENCOUNTER — Ambulatory Visit (INDEPENDENT_AMBULATORY_CARE_PROVIDER_SITE_OTHER): Payer: BC Managed Care – PPO | Admitting: Obstetrics and Gynecology

## 2022-03-15 ENCOUNTER — Encounter: Payer: Self-pay | Admitting: Obstetrics and Gynecology

## 2022-03-15 VITALS — BP 125/85 | HR 64

## 2022-03-15 DIAGNOSIS — Z9889 Other specified postprocedural states: Secondary | ICD-10-CM

## 2022-03-15 NOTE — Patient Instructions (Signed)

## 2022-03-15 NOTE — Progress Notes (Signed)
[facility]  Date of Visit: 03/15/2022  History of Present Illness: [name] is a 71 y.o. female scheduled today for a post-operative visit.   Surgery: s/p s/p Robotic assisted sacrocolpopexy Erenest Blank lite Y), midurethral sling, cystoscopy on 01/24/22  She passed her postoperative void trial.   Postoperative course has been  uncomplicated.   Today she reports she is doing well. She is using the estrace cream twice a week.   UTI in the last 6 weeks? No  Pain? No  She has not returned to her normal activity (except for postop restrictions) Vaginal bulge? No  Stress incontinence: No  Urgency/frequency: No  Urge incontinence: No  Voiding dysfunction: No  Bowel issues: Yes - was taking miralax then changed to dulcolax but now colace with milk of magnesia. Has BM every 2 days, stool is hard. Had similar issues after hysterectomy.   Subjective Success: Do you usually have a bulge or something falling out that you can see or feel in the vaginal area? No  Retreatment Success: Any retreatment with surgery or pessary for any compartment? No   Medications: She has a current medication list which includes the following prescription(s): atorvastatin, estradiol, lisinopril, magnesium hydroxide, multivitamin with minerals, omeprazole, OVER THE COUNTER MEDICATION, and polyethylene glycol powder.   Allergies: Patient is allergic to crestor [rosuvastatin calcium].   Physical Exam: BP 125/85   Pulse 64   Abdomen: soft, non-tender, without masses or organomegaly Laparoscopic and suprapubic Incisions: healing well.  Pelvic Examination: Vagina: Incisions healing well. Sutures are present at the cuff and there is not granulation tissue. No tenderness along the anterior or posterior vagina. No apical tenderness. No pelvic masses. No visible or palpable mesh.  POP-Q: POP-Q  -3                                            Aa   -3                                           Ba  -8                                               C   2.5                                            Gh  3.5                                            Pb  8                                            tvl   -3  Ap  -3                                            Bp                                                 D    ---------------------------------------------------------  Assessment and Plan:  1. Post-operative state    - Well healed - Can resume regular activity including exercise. Wait an additional 6 weeks if she decides to have intercourse.  - Discussed avoidance of heavy lifting and straining long term to reduce the risk of recurrence.  - For constipation, add in daily fiber supplement.   Follow up as needed  [name], MD

## 2022-06-06 DIAGNOSIS — M7711 Lateral epicondylitis, right elbow: Secondary | ICD-10-CM | POA: Diagnosis not present

## 2022-07-12 ENCOUNTER — Other Ambulatory Visit: Payer: Self-pay | Admitting: Family Medicine

## 2022-07-12 DIAGNOSIS — Z1231 Encounter for screening mammogram for malignant neoplasm of breast: Secondary | ICD-10-CM

## 2022-08-14 ENCOUNTER — Ambulatory Visit
Admission: RE | Admit: 2022-08-14 | Discharge: 2022-08-14 | Disposition: A | Discharge status: Home / Self Care | Payer: HMO | Source: Ambulatory Visit | Attending: Family Medicine | Admitting: Family Medicine

## 2022-08-14 DIAGNOSIS — Z1231 Encounter for screening mammogram for malignant neoplasm of breast: Secondary | ICD-10-CM | POA: Diagnosis not present

## 2022-09-07 ENCOUNTER — Other Ambulatory Visit: Payer: Self-pay | Admitting: Family Medicine

## 2022-09-07 NOTE — Telephone Encounter (Signed)
Requested Prescriptions  Pending Prescriptions Disp Refills   lisinopril (ZESTRIL) 10 MG tablet [Pharmacy Med Name: LISINOPRIL 10 MG TABLET] 90 tablet 0    Sig: TAKE 1 TABLET BY MOUTH EVERY DAY     Cardiovascular:  ACE Inhibitors Failed - 09/07/2022  1:50 AM      Failed - Cr in normal range and within 180 days    Creatinine, Ser  Date Value Ref Range Status  03/06/2022 0.79 0.57 - 1.00 mg/dL Final         Failed - K in normal range and within 180 days    Potassium  Date Value Ref Range Status  03/06/2022 4.0 3.5 - 5.2 mmol/L Final         Failed - Valid encounter within last 6 months    Recent Outpatient Visits           6 months ago Benign hypertensive renal disease   [place] [facility] [place], Megan P, DO   7 months ago Sore throat   [facility] [place], Oswaldo Conroy, PA-C   11 months ago Acute cystitis with hematuria   [place] [facility] [place], Megan P, DO   1 year ago Routine general medical examination at a health care facility   [facility] [place], [place] P, DO   1 year ago Mixed hyperlipidemia   [place] [facility] [place], [place], DO       Future Appointments             In 4 days Dorcas Carrow, DO [place] [facility], [facility]            Passed - Patient is not pregnant      Passed - Last BP in normal range    BP Readings from Last 1 Encounters:  03/15/22 125/85          atorvastatin (LIPITOR) 20 MG tablet [Pharmacy Med Name: ATORVASTATIN 20 MG TABLET] 90 tablet 1    Sig: TAKE 1 TABLET BY MOUTH EVERY DAY     Cardiovascular:  Antilipid - Statins Failed - 09/07/2022  1:50 AM      Failed - Lipid Panel in normal range within the last 12 months    Cholesterol, Total  Date Value Ref Range Status  03/06/2022 191 100 - 199 mg/dL Final   LDL Chol Calc (NIH)  Date Value Ref Range Status  03/06/2022 93 0 - 99 mg/dL Final   HDL  Date Value  Ref Range Status  03/06/2022 78 >39 mg/dL Final   Triglycerides  Date Value Ref Range Status  03/06/2022 116 0 - 149 mg/dL Final         Passed - Patient is not pregnant      Passed - Valid encounter within last 12 months    Recent Outpatient Visits           6 months ago Benign hypertensive renal disease   [place] [facility] [place], Megan P, DO   7 months ago Sore throat   [place] [facility] Mecum, Oswaldo Conroy, PA-C   11 months ago Acute cystitis with hematuria   [place] [facility] [place], Megan P, DO   1 year ago Routine general medical examination at a health care facility   [facility] [place], [place] P, DO   1 year ago Mixed hyperlipidemia   [place] [facility] Dorcas Carrow, DO       Future Appointments             In 4 days Dorcas Carrow, DO [place] [facility]

## 2022-09-07 NOTE — Telephone Encounter (Signed)
Called pt made follow up appt.  

## 2022-09-11 ENCOUNTER — Ambulatory Visit (INDEPENDENT_AMBULATORY_CARE_PROVIDER_SITE_OTHER): Payer: HMO | Admitting: Family Medicine

## 2022-09-11 ENCOUNTER — Encounter: Payer: Self-pay | Admitting: Family Medicine

## 2022-09-11 VITALS — BP 111/73 | HR 69 | Temp 98.2°F | Ht 65.0 in | Wt 160.8 lb

## 2022-09-11 DIAGNOSIS — D692 Other nonthrombocytopenic purpura: Secondary | ICD-10-CM

## 2022-09-11 DIAGNOSIS — G8929 Other chronic pain: Secondary | ICD-10-CM | POA: Diagnosis not present

## 2022-09-11 DIAGNOSIS — Z0000 Encounter for general adult medical examination without abnormal findings: Secondary | ICD-10-CM

## 2022-09-11 DIAGNOSIS — I129 Hypertensive chronic kidney disease with stage 1 through stage 4 chronic kidney disease, or unspecified chronic kidney disease: Secondary | ICD-10-CM | POA: Diagnosis not present

## 2022-09-11 DIAGNOSIS — R5382 Chronic fatigue, unspecified: Secondary | ICD-10-CM

## 2022-09-11 DIAGNOSIS — M5441 Lumbago with sciatica, right side: Secondary | ICD-10-CM

## 2022-09-11 DIAGNOSIS — E782 Mixed hyperlipidemia: Secondary | ICD-10-CM | POA: Diagnosis not present

## 2022-09-11 DIAGNOSIS — M542 Cervicalgia: Secondary | ICD-10-CM | POA: Diagnosis not present

## 2022-09-11 MED ORDER — KETOROLAC TROMETHAMINE 60 MG/2ML IM SOLN
60.0000 mg | Freq: Once | INTRAMUSCULAR | Status: AC
  Administered 2022-09-11: 60 mg via INTRAMUSCULAR

## 2022-09-11 MED ORDER — LISINOPRIL 10 MG PO TABS: 10.0000 mg | ORAL_TABLET | Freq: Every day | ORAL | 1 refills | Status: DC

## 2022-09-11 MED ORDER — OMEPRAZOLE 20 MG PO CPDR: 20.0000 mg | DELAYED_RELEASE_CAPSULE | Freq: Every day | ORAL | 1 refills | Status: DC | PRN

## 2022-09-11 MED ORDER — ATORVASTATIN CALCIUM 20 MG PO TABS: 20.0000 mg | ORAL_TABLET | Freq: Every day | ORAL | 1 refills | Status: DC

## 2022-09-11 NOTE — Assessment & Plan Note (Signed)
Reassured patient. Continue to monitor.  

## 2022-09-11 NOTE — Assessment & Plan Note (Signed)
Under good control on current regimen. Continue current regimen. Continue to monitor. Call with any concerns. Refills given. Labs drawn today.   

## 2022-09-11 NOTE — Progress Notes (Signed)
BP 111/73   Pulse 69   Temp 98.2 F (36.8 C) (Oral)   Ht 5\' 5"  (1.651 m)   Wt 160 lb 12.8 oz (72.9 kg)   SpO2 98%   BMI 26.76 kg/m    Subjective:    Patient ID: Makayla [name], female    DOB: 02/11/1951, 72 y.o.   MRN: [id number]  HPI: Makayla [name] is a 72 y.o. female presenting on 09/11/2022 for comprehensive medical examination. Current medical complaints include:  HYPERTENSION / HYPERLIPIDEMIA Satisfied with current treatment? yes Duration of hypertension: chronic BP monitoring frequency: not checking BP medication side effects: no Past BP meds: lisinopril Duration of hyperlipidemia: chronic Cholesterol medication side effects: no Cholesterol supplements: none Past cholesterol medications: atorvastatin Medication compliance: excellent compliance Aspirin: no Recent stressors: no Recurrent headaches: no Visual changes: no Palpitations: no Dyspnea: no Chest pain: no Lower extremity edema: no Dizzy/lightheaded: no  FATIGUE Duration:  months Severity: moderate  Onset: gradual Context when symptoms started:  stress with surgeries and her husband having dementia Symptoms improve with rest: yes  Depressive symptoms: yes Stress/anxiety: yes Insomnia: yes hard to stay asleep Snoring: no Observed apnea by bed partner: no Daytime hypersomnolence:no Wakes feeling refreshed: yes History of sleep study: no Dysnea on exertion:  no Orthopnea/PND: no Chest pain: no Chronic cough: no Lower extremity edema: no Arthralgias:yes Myalgias: yes Weakness: no Rash: no  BACK/NECK PAIN Duration:  chronic Mechanism of injury: unknown Location: bilateral and low back and bilateral neck and shoulders Onset: sudden Severity: moderate Quality: aching and sore Frequency: constant Radiation: R leg above the knee Aggravating factors: moving around Alleviating factors: biofreeze, ibuprofen Status: worse Treatments attempted: rest, ice, heat, APAP, ibuprofen, aleve, and  HEP  Relief with NSAIDs?: moderate Nighttime pain:  yes Paresthesias / decreased sensation:  yes Bowel / bladder incontinence:  no Fevers:  no Dysuria / urinary frequency:  no  Menopausal Symptoms: no  Depression Screen done today and results listed below:     02/01/2022    9:40 AM 11/29/2021   10:44 AM 09/30/2021    8:38 AM 09/02/2021    8:14 AM 11/19/2020   10:32 AM  Depression screen PHQ 2/9  Decreased Interest 0 0 0 0 0  Down, Depressed, Hopeless 0 0 0 0 0  PHQ - 2 Score 0 0 0 0 0  Altered sleeping 0 0 0 0   Tired, decreased energy 0 0 0 1   Change in appetite 0 0 0 0   Feeling bad or failure about yourself  0 0 0 0   Trouble concentrating 0 0 0 0   Moving slowly or fidgety/restless 0 0 0 0   Suicidal thoughts 0 0 0 0   PHQ-9 Score 0 0 0 1   Difficult doing work/chores  Not difficult at all Not difficult at all     Past Medical History:  Past Medical History:  Diagnosis Date   DDD (degenerative disc disease), lumbar    Family history of adverse reaction to anesthesia    grandson age 57 stopped breathing stayed in hospital for a few days,no cause found grandson is now 44   GERD (gastroesophageal reflux disease)    Hypertension    Sciatica    right    Surgical History:  Past Surgical History:  Procedure Laterality Date   BLADDER SUSPENSION N/A 01/24/2022   Procedure: TRANSVAGINAL TAPE (TVT) PROCEDURE;  Surgeon: Marguerita Beards, MD;  Location[facility];  Service: Gynecology;  Laterality: N/A;   COLONOSCOPY     cologuard negative   CYSTOSCOPY N/A 03/24/2021   Procedure: CYSTOSCOPY;  Surgeon: Nadara Mustard, MD;  Location: [facility];  Service: Gynecology;  Laterality: N/A;   CYSTOSCOPY  2013   CYSTOSCOPY N/A 01/24/2022   Procedure: CYSTOSCOPY;  Surgeon: Marguerita Beards, MD;  Location: [facility];  Service: Gynecology;  Laterality: N/A;   OOPHORECTOMY     OVARIAN CYST REMOVAL Left    age 78   ROBOTIC ASSISTED LAPAROSCOPIC  SACROCOLPOPEXY N/A 01/24/2022   Procedure: XI ROBOTIC ASSISTED LAPAROSCOPIC SACROCOLPOPEXY;  Surgeon: Marguerita Beards, MD;  Location: [facility];  Service: Gynecology;  Laterality: N/A;  total time requested is 3 hours   TOTAL LAPAROSCOPIC HYSTERECTOMY WITH BILATERAL SALPINGO OOPHORECTOMY Bilateral 03/24/2021   Procedure: TOTAL LAPAROSCOPIC HYSTERECTOMY WITH BILATERAL SALPINGO OOPHORECTOMY;  Surgeon: Nadara Mustard, MD;  Location: [facility];  Service: Gynecology;  Laterality: Bilateral;   TUBAL LIGATION     X 2    Medications:  Current Outpatient Medications on File Prior to Visit  Medication Sig   estradiol (ESTRACE) 0.1 MG/GM vaginal cream Place 0.5g (fingertip amount) nightly for two weeks then twice a week after.   Magnesium Hydroxide (MILK OF MAGNESIA PO) Take by mouth.   Multiple Vitamins-Minerals (MULTIVITAMIN WITH MINERALS) tablet Take 1 tablet by mouth daily.   OVER THE COUNTER MEDICATION Vitamin b 12 daily   polyethylene glycol powder (GLYCOLAX/MIRALAX) 17 GM/SCOOP powder Take 17 g by mouth daily. Drink 17g (1 scoop) dissolved in water per day.   No current facility-administered medications on file prior to visit.    Allergies:  Allergies  Allergen Reactions   Crestor [Rosuvastatin Calcium] Other (See Comments)    Headache    Social History:  Social History   Socioeconomic History   Marital status: Married    Spouse name: Not on file   Number of children: Not on file   Years of education: Not on file   Highest education level: High school graduate  Occupational History   Occupation: retired  Tobacco Use   Smoking status: Former    Packs/day: 0.50    Years: 15.00    Additional pack years: 0.00    Total pack years: 7.50    Types: Cigarettes    Quit date: 03/23/2002    Years since quitting: 20.4   Smokeless tobacco: Never  Vaping Use   Vaping Use: Never used  Substance and Sexual Activity   Alcohol use: No   Drug use: No   Sexual  activity: Not Currently  Other Topics Concern   Not on file  Social History Narrative   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    Social Determinants of Health   Financial Resource Strain: Low Risk  (11/19/2020)   Overall Financial Resource Strain (CARDIA)    Difficulty of Paying Living Expenses: Not hard at all  Food Insecurity: No Food Insecurity (11/29/2021)   Hunger Vital Sign    Worried About Running Out of Food in the Last Year: Never true    Ran Out of Food in the Last Year: Never true  Transportation Needs: No Transportation Needs (11/29/2021)   PRAPARE - Administrator, Civil Service (Medical): No    Lack of Transportation (Non-Medical): No  Physical Activity: Sufficiently Active (11/29/2021)   Exercise Vital Sign    Days of Exercise per Week: 5 days    Minutes of Exercise per Session: 40 min  Stress: No Stress  Concern Present (11/29/2021)   [employer] of Occupational Health - Occupational Stress Questionnaire    Feeling of Stress : Not at all  Social Connections: Socially Integrated (11/29/2021)   Social Connection and Isolation Panel [NHANES]    Frequency of Communication with Friends and Family: More than three times a week    Frequency of Social Gatherings with Friends and Family: More than three times a week    Attends Religious Services: More than 4 times per year    Active Member of [employer] or Organizations: Yes    Attends Engineer, structural: More than 4 times per year    Marital Status: Married  Catering manager Violence: Not At Risk (11/29/2021)   Humiliation, Afraid, Rape, and Kick questionnaire    Fear of Current or Ex-Partner: No    Emotionally Abused: No    Physically Abused: No    Sexually Abused: No   Social History   Tobacco Use  Smoking Status Former   Packs/day: 0.50   Years: 15.00   Additional pack years: 0.00   Total pack years: 7.50   Types: Cigarettes   Quit date: 03/23/2002   Years since quitting: 20.4  Smokeless Tobacco Never    Social History   Substance and Sexual Activity  Alcohol Use No    Family History:  Family History  Problem Relation Age of Onset   Breast cancer Sister 51   Stroke Mother    Cancer Brother        Melanoma- went to liver   Melanoma Brother    Liver cancer Brother    Liver cancer Brother     Past medical history, surgical history, medications, allergies, family history and social history reviewed with patient today and changes made to appropriate areas of the chart.   Review of Systems  Constitutional: Negative.   HENT: Negative.    Eyes: Negative.   Respiratory: Negative.    Cardiovascular: Negative.   Gastrointestinal: Negative.   Genitourinary:  Positive for dysuria. Negative for flank pain, frequency, hematuria and urgency.  Musculoskeletal:  Positive for back pain, joint pain, myalgias and neck pain. Negative for falls.  Skin: Negative.   Neurological: Negative.   Endo/Heme/Allergies:  Negative for environmental allergies and polydipsia. Bruises/bleeds easily.  Psychiatric/Behavioral: Negative.     All other ROS negative except what is listed above and in the HPI.      Objective:    BP 111/73   Pulse 69   Temp 98.2 F (36.8 C) (Oral)   Ht 5\' 5"  (1.651 m)   Wt 160 lb 12.8 oz (72.9 kg)   SpO2 98%   BMI 26.76 kg/m   Wt Readings from Last 3 Encounters:  09/11/22 160 lb 12.8 oz (72.9 kg)  03/06/22 160 lb 3.2 oz (72.7 kg)  02/01/22 161 lb 8 oz (73.3 kg)    Physical Exam Vitals and nursing note reviewed.  Constitutional:      General: She is not in acute distress.    Appearance: Normal appearance. She is normal weight. She is not ill-appearing, toxic-appearing or diaphoretic.  HENT:     Head: Normocephalic and atraumatic.     Right Ear: Tympanic membrane, ear canal and external ear normal. There is no impacted cerumen.     Left Ear: Tympanic membrane, ear canal and external ear normal. There is no impacted cerumen.     Nose: Nose normal. No congestion or  rhinorrhea.     Mouth/Throat:     Mouth: Mucous membranes  are moist.     Pharynx: Oropharynx is clear. No oropharyngeal exudate or posterior oropharyngeal erythema.  Eyes:     General: No scleral icterus.       Right eye: No discharge.        Left eye: No discharge.     Extraocular Movements: Extraocular movements intact.     Conjunctiva/sclera: Conjunctivae normal.     Pupils: Pupils are equal, round, and reactive to light.  Neck:     Vascular: No carotid bruit.  Cardiovascular:     Rate and Rhythm: Normal rate and regular rhythm.     Pulses: Normal pulses.     Heart sounds: No murmur heard.    No friction rub. No gallop.  Pulmonary:     Effort: Pulmonary effort is normal. No respiratory distress.     Breath sounds: Normal breath sounds. No stridor. No wheezing, rhonchi or rales.  Chest:     Chest wall: No tenderness.  Abdominal:     General: Abdomen is flat. Bowel sounds are normal. There is no distension.     Palpations: Abdomen is soft. There is no mass.     Tenderness: There is no abdominal tenderness. There is no right CVA tenderness, left CVA tenderness, guarding or rebound.     Hernia: No hernia is present.  Genitourinary:    Comments: Breast and pelvic exams deferred with shared decision making Musculoskeletal:        General: No swelling, tenderness, deformity or signs of injury.     Cervical back: Normal range of motion and neck supple. No rigidity. No muscular tenderness.     Right lower leg: No edema.     Left lower leg: No edema.  Lymphadenopathy:     Cervical: No cervical adenopathy.  Skin:    General: Skin is warm and dry.     Capillary Refill: Capillary refill takes less than 2 seconds.     Coloration: Skin is not jaundiced or pale.     Findings: No bruising, erythema, lesion or rash.  Neurological:     General: No focal deficit present.     Mental Status: She is alert and oriented to person, place, and time. Mental status is at baseline.     Cranial  Nerves: No cranial nerve deficit.     Sensory: No sensory deficit.     Motor: No weakness.     Coordination: Coordination normal.     Gait: Gait normal.     Deep Tendon Reflexes: Reflexes normal.  Psychiatric:        Mood and Affect: Mood normal.        Behavior: Behavior normal.        Thought Content: Thought content normal.        Judgment: Judgment normal.     Results for orders placed or performed in visit on 03/06/22  Comprehensive metabolic panel  Result Value Ref Range   Glucose 92 70 - 99 mg/dL   BUN 15 8 - 27 mg/dL   Creatinine, Ser 1.47 0.57 - 1.00 mg/dL   eGFR 81 >82 NF/AOZ/3.08   BUN/Creatinine Ratio 19 12 - 28   Sodium 139 134 - 144 mmol/L   Potassium 4.0 3.5 - 5.2 mmol/L   Chloride 99 96 - 106 mmol/L   CO2 23 20 - 29 mmol/L   Calcium 9.5 8.7 - 10.3 mg/dL   Total Protein 6.3 6.0 - 8.5 g/dL   Albumin 4.4 3.9 - 4.9 g/dL   Globulin, Total  1.9 1.5 - 4.5 g/dL   Albumin/Globulin Ratio 2.3 (H) 1.2 - 2.2   Bilirubin Total 0.3 0.0 - 1.2 mg/dL   Alkaline Phosphatase 87 44 - 121 IU/L   AST 16 0 - 40 IU/L   ALT 15 0 - 32 IU/L  Lipid Panel w/o Chol/HDL Ratio  Result Value Ref Range   Cholesterol, Total 191 100 - 199 mg/dL   Triglycerides 604 0 - 149 mg/dL   HDL 78 >54 mg/dL   VLDL Cholesterol Cal 20 5 - 40 mg/dL   LDL Chol Calc (NIH) 93 0 - 99 mg/dL      Assessment & Plan:   Problem List Items Addressed This Visit       Cardiovascular and Mediastinum   Senile purpura (HCC)    Reassured patient. Continue to monitor.       Relevant Medications   atorvastatin (LIPITOR) 20 MG tablet   lisinopril (ZESTRIL) 10 MG tablet   Other Relevant Orders   CBC with Differential/Platelet   Comprehensive metabolic panel     Genitourinary   Benign hypertensive renal disease    Under good control on current regimen. Continue current regimen. Continue to monitor. Call with any concerns. Refills given. Labs drawn today.        Relevant Orders   CBC with  Differential/Platelet   Comprehensive metabolic panel   Urinalysis, Routine w reflex microscopic   TSH   Microalbumin, Urine Waived     Other   Chronic low back pain   Relevant Medications   ketorolac (TORADOL) injection 60 mg (Start on 09/11/2022  5:00 PM)   Other Relevant Orders   Ambulatory referral to Orthopedic Surgery   Hyperlipidemia    Under good control on current regimen. Continue current regimen. Continue to monitor. Call with any concerns. Refills given. Labs drawn today.       Relevant Medications   atorvastatin (LIPITOR) 20 MG tablet   lisinopril (ZESTRIL) 10 MG tablet   Other Relevant Orders   CBC with Differential/Platelet   Comprehensive metabolic panel   Lipid Panel w/o Chol/HDL Ratio   Other Visit Diagnoses     Routine general medical examination at a health care facility    -  Primary   Vaccines up to date. Screening labs checked today. Mammo, DEXA and colonoguard up to date. Continue diet and exercise. Call with any concerns.   Chronic neck pain       Referral to ortho. Await their input. Toradol shot today.   Relevant Medications   ketorolac (TORADOL) injection 60 mg (Start on 09/11/2022  5:00 PM)   Other Relevant Orders   Ambulatory referral to Orthopedic Surgery   Chronic fatigue       Likely multifactorial. Will check labs and get her into ortho to help with neck and back pain. Call if not getting better or getting worse.        Follow up plan: Return in about 6 months (around 03/13/2023).   LABORATORY TESTING:  - Pap smear: not applicable  IMMUNIZATIONS:   - Tdap: Tetanus vaccination status reviewed: last tetanus booster within 10 years. - Influenza: Up to date - Pneumovax: Up to date - Prevnar: Up to date - COVID: Up to date - HPV: Not applicable - Shingrix vaccine: Up to date  SCREENING: -Mammogram: Up to date  - Colonoscopy: Up to date  - Bone Density: Up to date   PATIENT COUNSELING:   Advised to take 1 mg of folate supplement  per  day if capable of pregnancy.   Sexuality: Discussed sexually transmitted diseases, partner selection, use of condoms, avoidance of unintended pregnancy  and contraceptive alternatives.   Advised to avoid cigarette smoking.  I discussed with the patient that most people either abstain from alcohol or drink within safe limits (<=14/week and <=4 drinks/occasion for males, <=7/weeks and <= 3 drinks/occasion for females) and that the risk for alcohol disorders and other health effects rises proportionally with the number of drinks per week and how often a drinker exceeds daily limits.  Discussed cessation/primary prevention of drug use and availability of treatment for abuse.   Diet: Encouraged to adjust caloric intake to maintain  or achieve ideal body weight, to reduce intake of dietary saturated fat and total fat, to limit sodium intake by avoiding high sodium foods and not adding table salt, and to maintain adequate dietary potassium and calcium preferably from fresh fruits, vegetables, and low-fat dairy products.    stressed the importance of regular exercise  Injury prevention: Discussed safety belts, safety helmets, smoke detector, smoking near bedding or upholstery.   Dental health: Discussed importance of regular tooth brushing, flossing, and dental visits.    NEXT PREVENTATIVE PHYSICAL DUE IN 1 YEAR. Return in about 6 months (around 03/13/2023).

## 2022-09-12 ENCOUNTER — Telehealth: Payer: Self-pay

## 2022-09-12 LAB — CBC WITH DIFFERENTIAL/PLATELET
Basophils Absolute: 0.1 x10E3/uL (ref 0.0–0.2)
Basos: 1 %
EOS (ABSOLUTE): 0.2 x10E3/uL (ref 0.0–0.4)
Eos: 2 %
Hematocrit: 38.3 % (ref 34.0–46.6)
Hemoglobin: 12.3 g/dL (ref 11.1–15.9)
Immature Grans (Abs): 0 x10E3/uL (ref 0.0–0.1)
Immature Granulocytes: 0 %
Lymphocytes Absolute: 2.8 x10E3/uL (ref 0.7–3.1)
Lymphs: 33 %
MCH: 31.8 pg (ref 26.6–33.0)
MCHC: 32.1 g/dL (ref 31.5–35.7)
MCV: 99 fL — ABNORMAL HIGH (ref 79–97)
Monocytes Absolute: 0.7 x10E3/uL (ref 0.1–0.9)
Monocytes: 9 %
Neutrophils Absolute: 4.6 x10E3/uL (ref 1.4–7.0)
Neutrophils: 55 %
Platelets: 220 x10E3/uL (ref 150–450)
RBC: 3.87 x10E6/uL (ref 3.77–5.28)
RDW: 12.4 % (ref 11.7–15.4)
WBC: 8.4 x10E3/uL (ref 3.4–10.8)

## 2022-09-12 LAB — LIPID PANEL W/O CHOL/HDL RATIO
Cholesterol, Total: 196 mg/dL (ref 100–199)
HDL: 66 mg/dL
LDL Chol Calc (NIH): 78 mg/dL (ref 0–99)
Triglycerides: 324 mg/dL — ABNORMAL HIGH (ref 0–149)
VLDL Cholesterol Cal: 52 mg/dL — ABNORMAL HIGH (ref 5–40)

## 2022-09-12 LAB — URINALYSIS, ROUTINE W REFLEX MICROSCOPIC
Bilirubin, UA: NEGATIVE
Glucose, UA: NEGATIVE
Ketones, UA: NEGATIVE
Nitrite, UA: NEGATIVE
Protein,UA: NEGATIVE
Specific Gravity, UA: 1.025 (ref 1.005–1.030)
Urobilinogen, Ur: 0.2 mg/dL (ref 0.2–1.0)
pH, UA: 5.5 (ref 5.0–7.5)

## 2022-09-12 LAB — COMPREHENSIVE METABOLIC PANEL
ALT: 16 IU/L (ref 0–32)
AST: 17 IU/L (ref 0–40)
Albumin/Globulin Ratio: 2.4 — ABNORMAL HIGH (ref 1.2–2.2)
Albumin: 4.4 g/dL (ref 3.9–4.9)
Alkaline Phosphatase: 87 IU/L (ref 44–121)
BUN/Creatinine Ratio: 29 — ABNORMAL HIGH (ref 12–28)
BUN: 25 mg/dL (ref 8–27)
Bilirubin Total: <0.2 mg/dL (ref 0.0–1.2)
CO2: 22 mmol/L (ref 20–29)
Calcium: 9.8 mg/dL (ref 8.7–10.3)
Chloride: 105 mmol/L (ref 96–106)
Creatinine, Ser: 0.86 mg/dL (ref 0.57–1.00)
Globulin, Total: 1.8 g/dL (ref 1.5–4.5)
Glucose: 95 mg/dL (ref 70–99)
Potassium: 4.5 mmol/L (ref 3.5–5.2)
Sodium: 141 mmol/L (ref 134–144)
Total Protein: 6.2 g/dL (ref 6.0–8.5)
eGFR: 73 mL/min/{1.73_m2} (ref >59)

## 2022-09-12 LAB — MICROSCOPIC EXAMINATION: Bacteria, UA: NONE SEEN

## 2022-09-12 LAB — MICROALBUMIN, URINE WAIVED
Creatinine, Urine Waived: 200 mg/dL (ref 10–300)
Microalb, Ur Waived: 30 mg/L — ABNORMAL HIGH (ref 0–19)
Microalb/Creat Ratio: <30 mg/g (ref <30)

## 2022-09-12 LAB — TSH: TSH: 2.55 u[IU]/mL (ref 0.450–4.500)

## 2022-09-12 NOTE — Telephone Encounter (Signed)
-----   Message from Dorcas Carrow, DO sent at 09/11/2022  4:46 PM EDT ----- Shingrix at [facility] a couple of years ago

## 2022-09-12 NOTE — Telephone Encounter (Signed)
[facility] Shingrix 1st week 01/08/2015 at [facility].

## 2022-09-22 DIAGNOSIS — M542 Cervicalgia: Secondary | ICD-10-CM | POA: Diagnosis not present

## 2022-09-22 DIAGNOSIS — M5030 Other cervical disc degeneration, unspecified cervical region: Secondary | ICD-10-CM | POA: Diagnosis not present

## 2022-09-22 DIAGNOSIS — M47816 Spondylosis without myelopathy or radiculopathy, lumbar region: Secondary | ICD-10-CM | POA: Diagnosis not present

## 2022-09-22 DIAGNOSIS — M47812 Spondylosis without myelopathy or radiculopathy, cervical region: Secondary | ICD-10-CM | POA: Diagnosis not present

## 2022-09-22 DIAGNOSIS — M5136 Other intervertebral disc degeneration, lumbar region: Secondary | ICD-10-CM | POA: Diagnosis not present

## 2022-09-22 DIAGNOSIS — M5416 Radiculopathy, lumbar region: Secondary | ICD-10-CM | POA: Diagnosis not present

## 2022-09-22 DIAGNOSIS — G8929 Other chronic pain: Secondary | ICD-10-CM | POA: Diagnosis not present

## 2022-10-05 DIAGNOSIS — M47816 Spondylosis without myelopathy or radiculopathy, lumbar region: Secondary | ICD-10-CM | POA: Diagnosis not present

## 2022-10-05 DIAGNOSIS — M47812 Spondylosis without myelopathy or radiculopathy, cervical region: Secondary | ICD-10-CM | POA: Diagnosis not present

## 2022-10-05 DIAGNOSIS — M5030 Other cervical disc degeneration, unspecified cervical region: Secondary | ICD-10-CM | POA: Diagnosis not present

## 2022-10-05 DIAGNOSIS — M5136 Other intervertebral disc degeneration, lumbar region: Secondary | ICD-10-CM | POA: Diagnosis not present

## 2022-10-18 DIAGNOSIS — M47816 Spondylosis without myelopathy or radiculopathy, lumbar region: Secondary | ICD-10-CM | POA: Diagnosis not present

## 2022-11-02 DIAGNOSIS — M47816 Spondylosis without myelopathy or radiculopathy, lumbar region: Secondary | ICD-10-CM | POA: Diagnosis not present

## 2022-11-23 DIAGNOSIS — M47816 Spondylosis without myelopathy or radiculopathy, lumbar region: Secondary | ICD-10-CM | POA: Diagnosis not present

## 2022-12-05 ENCOUNTER — Ambulatory Visit (INDEPENDENT_AMBULATORY_CARE_PROVIDER_SITE_OTHER): Payer: HMO

## 2022-12-05 VITALS — Ht 65.0 in | Wt 160.0 lb

## 2022-12-05 DIAGNOSIS — Z0000 Encounter for general adult medical examination without abnormal findings: Secondary | ICD-10-CM

## 2022-12-05 NOTE — Patient Instructions (Signed)
Makayla [name] , Thank you for taking time to come for your Medicare Wellness Visit. I appreciate your ongoing commitment to your health goals. Please review the following plan we discussed and let me know if I can assist you in the future.   These are the goals we discussed:  Goals       DIET - EAT MORE FRUITS AND VEGETABLES      DIET - REDUCE SODIUM INTAKE (pt-stated)      Exercise 3x per week (30 min per time)      Patient Stated      11/19/2020, wants to get rid of back pain      Patient Stated      Continue walking         This is a list of the screening recommended for you and due dates:  Health Maintenance  Topic Date Due   Zoster (Shingles) Vaccine (2 of 2) 03/22/2015   COVID-19 Vaccine (2 - Janssen risk series) 08/22/2019   Flu Shot  12/14/2022   Mammogram  08/14/2023   Medicare Annual Wellness Visit  12/05/2023   Cologuard (Stool DNA test)  09/09/2024   DTaP/Tdap/Td vaccine (2 - Tdap) 09/02/2030   Pneumonia Vaccine  Completed   DEXA scan (bone density measurement)  Completed   Hepatitis C Screening  Completed   HPV Vaccine  Aged Out   Colon Cancer Screening  Discontinued    Advanced directives: no  Conditions/risks identified: none  Next appointment: Follow up in one year for your annual wellness visit 12/11/23 @ 10:30 am by phone   Preventive Care 65 Years and Older, Female Preventive care refers to lifestyle choices and visits with your health care provider that can promote health and wellness. What does preventive care include? A yearly physical exam. This is also called an annual well check. Dental exams once or twice a year. Routine eye exams. Ask your health care provider how often you should have your eyes checked. Personal lifestyle choices, including: Daily care of your teeth and gums. Regular physical activity. Eating a healthy diet. Avoiding tobacco and drug use. Limiting alcohol use. Practicing safe sex. Taking low-dose aspirin every day. Taking  vitamin and mineral supplements as recommended by your health care provider. What happens during an annual well check? The services and screenings done by your health care provider during your annual well check will depend on your age, overall health, lifestyle risk factors, and family history of disease. Counseling  Your health care provider may ask you questions about your: Alcohol use. Tobacco use. Drug use. Emotional well-being. Home and relationship well-being. Sexual activity. Eating habits. History of falls. Memory and ability to understand (cognition). Work and work Astronomer. Reproductive health. Screening  You may have the following tests or measurements: Height, weight, and BMI. Blood pressure. Lipid and cholesterol levels. These may be checked every 5 years, or more frequently if you are over 79 years old. Skin check. Lung cancer screening. You may have this screening every year starting at age 75 if you have a 30-pack-year history of smoking and currently smoke or have quit within the past 15 years. Fecal occult blood test (FOBT) of the stool. You may have this test every year starting at age 83. Flexible sigmoidoscopy or colonoscopy. You may have a sigmoidoscopy every 5 years or a colonoscopy every 10 years starting at age 42. Hepatitis C blood test. Hepatitis B blood test. Sexually transmitted disease (STD) testing. Diabetes screening. This is done by checking your  blood sugar (glucose) after you have not eaten for a while (fasting). You may have this done every 1-3 years. Bone density scan. This is done to screen for osteoporosis. You may have this done starting at age 50. Mammogram. This may be done every 1-2 years. Talk to your health care provider about how often you should have regular mammograms. Talk with your health care provider about your test results, treatment options, and if necessary, the need for more tests. Vaccines  Your health care provider may  recommend certain vaccines, such as: Influenza vaccine. This is recommended every year. Tetanus, diphtheria, and acellular pertussis (Tdap, Td) vaccine. You may need a Td booster every 10 years. Zoster vaccine. You may need this after age 67. Pneumococcal 13-valent conjugate (PCV13) vaccine. One dose is recommended after age 20. Pneumococcal polysaccharide (PPSV23) vaccine. One dose is recommended after age 45. Talk to your health care provider about which screenings and vaccines you need and how often you need them. This information is not intended to replace advice given to you by your health care provider. Make sure you discuss any questions you have with your health care provider. Document Released: 05/28/2015 Document Revised: 01/19/2016 Document Reviewed: 03/02/2015 Elsevier Interactive Patient Education  2017 [employer].  Fall Prevention in the Home Falls can cause injuries. They can happen to people of all ages. There are many things you can do to make your home safe and to help prevent falls. What can I do on the outside of my home? Regularly fix the edges of walkways and driveways and fix any cracks. Remove anything that might make you trip as you walk through a door, such as a raised step or threshold. Trim any bushes or trees on the path to your home. Use bright outdoor lighting. Clear any walking paths of anything that might make someone trip, such as rocks or tools. Regularly check to see if handrails are loose or broken. Make sure that both sides of any steps have handrails. Any raised decks and porches should have guardrails on the edges. Have any leaves, snow, or ice cleared regularly. Use sand or salt on walking paths during winter. Clean up any spills in your garage right away. This includes oil or grease spills. What can I do in the bathroom? Use night lights. Install grab bars by the toilet and in the tub and shower. Do not use towel bars as grab bars. Use non-skid  mats or decals in the tub or shower. If you need to sit down in the shower, use a plastic, non-slip stool. Keep the floor dry. Clean up any water that spills on the floor as soon as it happens. Remove soap buildup in the tub or shower regularly. Attach bath mats securely with double-sided non-slip rug tape. Do not have throw rugs and other things on the floor that can make you trip. What can I do in the bedroom? Use night lights. Make sure that you have a light by your bed that is easy to reach. Do not use any sheets or blankets that are too big for your bed. They should not hang down onto the floor. Have a firm chair that has side arms. You can use this for support while you get dressed. Do not have throw rugs and other things on the floor that can make you trip. What can I do in the kitchen? Clean up any spills right away. Avoid walking on wet floors. Keep items that you use a lot in  easy-to-reach places. If you need to reach something above you, use a strong step stool that has a grab bar. Keep electrical cords out of the way. Do not use floor polish or wax that makes floors slippery. If you must use wax, use non-skid floor wax. Do not have throw rugs and other things on the floor that can make you trip. What can I do with my stairs? Do not leave any items on the stairs. Make sure that there are handrails on both sides of the stairs and use them. Fix handrails that are broken or loose. Make sure that handrails are as long as the stairways. Check any carpeting to make sure that it is firmly attached to the stairs. Fix any carpet that is loose or worn. Avoid having throw rugs at the top or bottom of the stairs. If you do have throw rugs, attach them to the floor with carpet tape. Make sure that you have a light switch at the top of the stairs and the bottom of the stairs. If you do not have them, ask someone to add them for you. What else can I do to help prevent falls? Wear shoes  that: Do not have high heels. Have rubber bottoms. Are comfortable and fit you well. Are closed at the toe. Do not wear sandals. If you use a stepladder: Make sure that it is fully opened. Do not climb a closed stepladder. Make sure that both sides of the stepladder are locked into place. Ask someone to hold it for you, if possible. Clearly mark and make sure that you can see: Any grab bars or handrails. First and last steps. Where the edge of each step is. Use tools that help you move around (mobility aids) if they are needed. These include: Canes. Walkers. Scooters. Crutches. Turn on the lights when you go into a dark area. Replace any light bulbs as soon as they burn out. Set up your furniture so you have a clear path. Avoid moving your furniture around. If any of your floors are uneven, fix them. If there are any pets around you, be aware of where they are. Review your medicines with your doctor. Some medicines can make you feel dizzy. This can increase your chance of falling. Ask your doctor what other things that you can do to help prevent falls. This information is not intended to replace advice given to you by your health care provider. Make sure you discuss any questions you have with your health care provider. Document Released: 02/25/2009 Document Revised: 10/07/2015 Document Reviewed: 06/05/2014 Elsevier Interactive Patient Education  2017 [employer].

## 2022-12-05 NOTE — Progress Notes (Signed)
Subjective:   Makayla [name] is a 72 y.o. female who presents for Medicare Annual (Subsequent) preventive examination.  Per patient no change in vitals since last visit; unable to obtain new vitals due to this being a telehealth visit. 12/05/22 Patient was unable to self-report vital signs via telehealth due to a lack of equipment at home.   Visit Complete: Virtual  I connected with  Makayla [name] on 12/05/22 by a audio enabled telemedicine application and verified that I am speaking with the correct person using two identifiers.  Patient Location: Home  Provider Location: Office/Clinic  I discussed the limitations of evaluation and management by telemedicine. The patient expressed understanding and agreed to proceed.   Review of Systems     Cardiac Risk Factors include: advanced age (>66men, >60 women);hypertension;dyslipidemia     Objective:    Today's Vitals   12/05/22 1046  Weight: 160 lb (72.6 kg)  Height: 5\' 5"  (1.651 m)   Body mass index is 26.63 kg/m.     12/05/2022   10:36 AM 01/24/2022    9:29 AM 11/29/2021   10:40 AM 03/24/2021    8:42 AM 03/14/2021    3:20 PM 11/19/2020   10:31 AM 08/25/2019    9:05 AM  Advanced Directives  Does Patient Have a Medical Advance Directive? No Yes Yes Yes Yes No No  Type of [employer] Power of Sisters;Living will     Does patient want to make changes to medical advance directive?  No - Patient declined  No - Patient declined   Yes (MAU/Ambulatory/Procedural Areas - Information given)  Copy of Healthcare Power of Attorney in Chart?   No - copy requested No - copy requested     Would patient like information on creating a medical advance directive? No - Patient declined   No - Patient declined       Current Medications (verified) Outpatient Encounter Medications as of 12/05/2022  Medication Sig   atorvastatin (LIPITOR) 20 MG tablet Take 1 tablet (20 mg total) by mouth daily.    estradiol (ESTRACE) 0.1 MG/GM vaginal cream Place 0.5g (fingertip amount) nightly for two weeks then twice a week after.   lisinopril (ZESTRIL) 10 MG tablet Take 1 tablet (10 mg total) by mouth daily.   Multiple Vitamins-Minerals (MULTIVITAMIN WITH MINERALS) tablet Take 1 tablet by mouth daily.   omeprazole (PRILOSEC) 20 MG capsule Take 1 capsule (20 mg total) by mouth daily as needed. TAKE 1 CAPSULE BY MOUTH EVERY DAY   OVER THE COUNTER MEDICATION Vitamin b 12 daily   polyethylene glycol powder (GLYCOLAX/MIRALAX) 17 GM/SCOOP powder Take 17 g by mouth daily. Drink 17g (1 scoop) dissolved in water per day.   Magnesium Hydroxide (MILK OF MAGNESIA PO) Take by mouth. (Patient not taking: Reported on 12/05/2022)   No facility-administered encounter medications on file as of 12/05/2022.    Allergies (verified) Crestor [rosuvastatin calcium]   History: Past Medical History:  Diagnosis Date   DDD (degenerative disc disease), lumbar    Family history of adverse reaction to anesthesia    grandson age 52 stopped breathing stayed in hospital for a few days,no cause found grandson is now 95   GERD (gastroesophageal reflux disease)    Hypertension    Sciatica    right   Past Surgical History:  Procedure Laterality Date   BLADDER SUSPENSION N/A 01/24/2022   Procedure: TRANSVAGINAL TAPE (TVT) PROCEDURE;  Surgeon: Marguerita Beards, MD;  Location: [facility];  Service: Gynecology;  Laterality: N/A;   COLONOSCOPY     cologuard negative   CYSTOSCOPY N/A 03/24/2021   Procedure: CYSTOSCOPY;  Surgeon: Nadara Mustard, MD;  Location: [facility];  Service: Gynecology;  Laterality: N/A;   CYSTOSCOPY  2013   CYSTOSCOPY N/A 01/24/2022   Procedure: CYSTOSCOPY;  Surgeon: Marguerita Beards, MD;  Location: [facility];  Service: Gynecology;  Laterality: N/A;   OOPHORECTOMY     OVARIAN CYST REMOVAL Left    age 45   ROBOTIC ASSISTED LAPAROSCOPIC SACROCOLPOPEXY N/A 01/24/2022    Procedure: XI ROBOTIC ASSISTED LAPAROSCOPIC SACROCOLPOPEXY;  Surgeon: Marguerita Beards, MD;  Location: [facility];  Service: Gynecology;  Laterality: N/A;  total time requested is 3 hours   TOTAL LAPAROSCOPIC HYSTERECTOMY WITH BILATERAL SALPINGO OOPHORECTOMY Bilateral 03/24/2021   Procedure: TOTAL LAPAROSCOPIC HYSTERECTOMY WITH BILATERAL SALPINGO OOPHORECTOMY;  Surgeon: Nadara Mustard, MD;  Location: [facility];  Service: Gynecology;  Laterality: Bilateral;   TUBAL LIGATION     X 2   Family History  Problem Relation Age of Onset   Breast cancer Sister 76   Stroke Mother    Cancer Brother        Melanoma- went to liver   Melanoma Brother    Liver cancer Brother    Liver cancer Brother    Social History   Socioeconomic History   Marital status: Married    Spouse name: Not on file   Number of children: Not on file   Years of education: Not on file   Highest education level: High school graduate  Occupational History   Occupation: retired  Tobacco Use   Smoking status: Former    Current packs/day: 0.00    Average packs/day: 0.5 packs/day for 15.0 years (7.5 ttl pk-yrs)    Types: Cigarettes    Start date: 03/24/1987    Quit date: 03/23/2002    Years since quitting: 20.7   Smokeless tobacco: Never  Vaping Use   Vaping status: Never Used  Substance and Sexual Activity   Alcohol use: No   Drug use: No   Sexual activity: Not Currently  Other Topics Concern   Not on file  Social History Narrative   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    Social Determinants of Health   Financial Resource Strain: Low Risk  (12/05/2022)   Overall Financial Resource Strain (CARDIA)    Difficulty of Paying Living Expenses: Not hard at all  Food Insecurity: No Food Insecurity (12/05/2022)   Hunger Vital Sign    Worried About Running Out of Food in the Last Year: Never true    Ran Out of Food in the Last Year: Never true  Transportation Needs: No Transportation Needs (12/05/2022)   PRAPARE  - Administrator, Civil Service (Medical): No    Lack of Transportation (Non-Medical): No  Physical Activity: Sufficiently Active (12/05/2022)   Exercise Vital Sign    Days of Exercise per Week: 5 days    Minutes of Exercise per Session: 40 min  Stress: No Stress Concern Present (12/05/2022)   [employer] of Occupational Health - Occupational Stress Questionnaire    Feeling of Stress : Only a little  Social Connections: Moderately Integrated (12/05/2022)   Social Connection and Isolation Panel [NHANES]    Frequency of Communication with Friends and Family: More than three times a week    Frequency of Social Gatherings with Friends and Family: Twice a week  Attends Religious Services: More than 4 times per year    Active Member of Clubs or Organizations: No    Attends Banker Meetings: Never    Marital Status: Married    Tobacco Counseling Counseling given: Not Answered   Clinical Intake:  Pre-visit preparation completed: Yes  Pain : No/denies pain     Nutritional Risks: None Diabetes: No  How often do you need to have someone help you when you read instructions, pamphlets, or other written materials from your doctor or pharmacy?: 1 - Never  Interpreter Needed?: No  Information entered by :: Kennedy Bucker, LPN   Activities of Daily Living    12/05/2022   10:37 AM 01/24/2022    9:33 AM  In your present state of health, do you have any difficulty performing the following activities:  Hearing? 0 0  Vision? 0 0  Difficulty concentrating or making decisions? 0 0  Walking or climbing stairs? 0 0  Dressing or bathing? 0 0  Doing errands, shopping? 0   Preparing Food and eating ? N   Using the Toilet? N   In the past six months, have you accidently leaked urine? N   Do you have problems with loss of bowel control? N   Managing your Medications? N   Managing your Finances? N   Housekeeping or managing your Housekeeping? N     Patient  Care Team: Dorcas Carrow, DO as PCP - General (Family Medicine) Dimmig, Maisie Fus, MD as Referring Physician (Orthopedic Surgery)  Indicate any recent Medical Services you may have received from other than [facility] providers in the past year (date may be approximate).     Assessment:   This is a routine wellness examination for Jawanna.  Hearing/Vision screen Hearing Screening - Comments:: No aids Vision Screening - Comments:: Readers- Dr.Woodard   Dietary issues and exercise activities discussed:     Goals Addressed             This Visit's Progress    DIET - EAT MORE FRUITS AND VEGETABLES         Depression Screen    12/05/2022   10:34 AM 02/01/2022    9:40 AM 11/29/2021   10:44 AM 09/30/2021    8:38 AM 09/02/2021    8:14 AM 11/19/2020   10:32 AM 09/01/2020    8:07 AM  PHQ 2/9 Scores  PHQ - 2 Score 0 0 0 0 0 0 0  PHQ- 9 Score 0 0 0 0 1      Fall Risk    12/05/2022   10:37 AM 02/01/2022    9:40 AM 11/29/2021   10:38 AM 09/02/2021    8:14 AM 11/19/2020   10:32 AM  Fall Risk   Falls in the past year? 0 0 0 0 0  Number falls in past yr: 0 0  0   Injury with Fall? 0 0 0 0   Risk for fall due to : No Fall Risks No Fall Risks  No Fall Risks Medication side effect  Follow up Falls prevention discussed;Falls evaluation completed Falls evaluation completed Falls evaluation completed;Education provided;Falls prevention discussed Falls evaluation completed Falls evaluation completed;Education provided;Falls prevention discussed    MEDICARE RISK AT HOME:  Medicare Risk at Home - 12/05/22 1038     Any stairs in or around the home? No    If so, are there any without handrails? No    Home free of loose throw rugs in walkways, pet beds, electrical  cords, etc? Yes    Adequate lighting in your home to reduce risk of falls? Yes    Life alert? No    Use of a cane, walker or w/c? No    Grab bars in the bathroom? Yes    Shower chair or bench in shower? No    Elevated toilet seat or a  handicapped toilet? No             TIMED UP AND GO:  Was the test performed?  No    Cognitive Function:        12/05/2022   10:38 AM 11/29/2021   10:39 AM 11/19/2020   10:33 AM 08/25/2019    9:06 AM 07/03/2018    2:36 PM  6CIT Screen  What Year? 0 points 0 points 0 points 0 points 0 points  What month? 0 points 0 points 0 points 0 points 0 points  What time? 0 points 0 points 0 points 0 points 0 points  Count back from 20 0 points 0 points 0 points 0 points 0 points  Months in reverse 0 points 0 points 0 points 0 points 0 points  Repeat phrase 0 points 0 points 2 points 0 points 0 points  Total Score 0 points 0 points 2 points 0 points 0 points    Immunizations Immunization History  Administered Date(s) Administered   Fluad Quad(high Dose 65+) 02/23/2021, 03/06/2022   Influenza, High Dose Seasonal PF 03/15/2018   Influenza,inj,Quad PF,6+ Mos 02/18/2019   Influenza-Unspecified 03/05/2017   Janssen (J&J) SARS-COV-2 Vaccination 07/25/2019   Pneumococcal Conjugate-13 06/19/2017   Pneumococcal Polysaccharide-23 05/15/2012, 07/03/2018   Td 09/01/2020   Tetanus 10/29/2005   Zoster Recombinant(Shingrix) 01/08/2015   Zoster, Live 01/25/2015    TDAP status: Up to date  Flu Vaccine status: Up to date  Pneumococcal vaccine status: Up to date  Covid-19 vaccine status: Declined, Education has been provided regarding the importance of this vaccine but patient still declined. Advised may receive this vaccine at local pharmacy or Health Dept.or vaccine clinic. Aware to provide a copy of the vaccination record if obtained from local pharmacy or Health Dept. Verbalized acceptance and understanding.  Qualifies for Shingles Vaccine? Yes   Zostavax completed Yes   Shingrix Completed?: No.    Education has been provided regarding the importance of this vaccine. Patient has been advised to call insurance company to determine out of pocket expense if they have not yet received this  vaccine. Advised may also receive vaccine at local pharmacy or Health Dept. Verbalized acceptance and understanding.  Screening Tests Health Maintenance  Topic Date Due   Zoster Vaccines- Shingrix (2 of 2) 03/22/2015   COVID-19 Vaccine (2 - Janssen risk series) 08/22/2019   INFLUENZA VACCINE  12/14/2022   MAMMOGRAM  08/14/2023   Medicare Annual Wellness (AWV)  12/05/2023   Fecal DNA (Cologuard)  09/09/2024   DTaP/Tdap/Td (2 - Tdap) 09/02/2030   Pneumonia Vaccine 62+ Years old  Completed   DEXA SCAN  Completed   Hepatitis C Screening  Completed   HPV VACCINES  Aged Out   Colonoscopy  Discontinued    Health Maintenance  Health Maintenance Due  Topic Date Due   Zoster Vaccines- Shingrix (2 of 2) 03/22/2015   COVID-19 Vaccine (2 - Janssen risk series) 08/22/2019    Colorectal cancer screening: Type of screening: Cologuard. Completed 09/09/21. Repeat every 3 years  Mammogram status: Completed 08/14/22. Repeat every year  Bone Density status: Completed 05/10/20. Results reflect: Bone density results:  OSTEOPENIA. Repeat every 5 years.  Lung Cancer Screening: (Low Dose CT Chest recommended if Age 23-80 years, 20 pack-year currently smoking OR have quit w/in 15years.) does not qualify.    Additional Screening:  Hepatitis C Screening: does qualify; Completed 06/19/17  Vision Screening: Recommended annual ophthalmology exams for early detection of glaucoma and other disorders of the eye. Is the patient up to date with their annual eye exam?  Yes  Who is the provider or what is the name of the office in which the patient attends annual eye exams? Dr.Woodard If pt is not established with a provider, would they like to be referred to a provider to establish care? No .   Dental Screening: Recommended annual dental exams for proper oral hygiene    Community Resource Referral / Chronic Care Management: CRR required this visit?  No   CCM required this visit?  No     Plan:     I  have personally reviewed and noted the following in the patient's chart:   Medical and social history Use of alcohol, tobacco or illicit drugs  Current medications and supplements including opioid prescriptions. Patient is not currently taking opioid prescriptions. Functional ability and status Nutritional status Physical activity Advanced directives List of other physicians Hospitalizations, surgeries, and ER visits in previous 12 months Vitals Screenings to include cognitive, depression, and falls Referrals and appointments  In addition, I have reviewed and discussed with patient certain preventive protocols, quality metrics, and best practice recommendations. A written personalized care plan for preventive services as well as general preventive health recommendations were provided to patient.     Hal Hope, LPN   09/06/9561   After Visit Summary: (MyChart) Due to this being a telephonic visit, the after visit summary with patients personalized plan was offered to patient via MyChart   Nurse Notes: none

## 2023-01-01 DIAGNOSIS — M62838 Other muscle spasm: Secondary | ICD-10-CM | POA: Diagnosis not present

## 2023-01-01 DIAGNOSIS — M47816 Spondylosis without myelopathy or radiculopathy, lumbar region: Secondary | ICD-10-CM | POA: Diagnosis not present

## 2023-01-01 DIAGNOSIS — M47812 Spondylosis without myelopathy or radiculopathy, cervical region: Secondary | ICD-10-CM | POA: Diagnosis not present

## 2023-01-01 DIAGNOSIS — M5030 Other cervical disc degeneration, unspecified cervical region: Secondary | ICD-10-CM | POA: Diagnosis not present

## 2023-01-01 DIAGNOSIS — M5136 Other intervertebral disc degeneration, lumbar region: Secondary | ICD-10-CM | POA: Diagnosis not present

## 2023-01-25 DIAGNOSIS — M542 Cervicalgia: Secondary | ICD-10-CM | POA: Diagnosis not present

## 2023-02-02 DIAGNOSIS — M542 Cervicalgia: Secondary | ICD-10-CM | POA: Diagnosis not present

## 2023-02-08 ENCOUNTER — Other Ambulatory Visit: Payer: Self-pay | Admitting: Physical Medicine and Rehabilitation

## 2023-02-08 DIAGNOSIS — M5412 Radiculopathy, cervical region: Secondary | ICD-10-CM

## 2023-02-09 DIAGNOSIS — M542 Cervicalgia: Secondary | ICD-10-CM | POA: Diagnosis not present

## 2023-02-12 ENCOUNTER — Encounter: Payer: Self-pay | Admitting: Family Medicine

## 2023-02-12 ENCOUNTER — Ambulatory Visit (INDEPENDENT_AMBULATORY_CARE_PROVIDER_SITE_OTHER): Payer: HMO | Admitting: Family Medicine

## 2023-02-12 VITALS — BP 118/79 | HR 68 | Temp 98.0°F | Wt 163.6 lb

## 2023-02-12 DIAGNOSIS — G8929 Other chronic pain: Secondary | ICD-10-CM

## 2023-02-12 DIAGNOSIS — E782 Mixed hyperlipidemia: Secondary | ICD-10-CM

## 2023-02-12 DIAGNOSIS — M542 Cervicalgia: Secondary | ICD-10-CM

## 2023-02-12 DIAGNOSIS — I129 Hypertensive chronic kidney disease with stage 1 through stage 4 chronic kidney disease, or unspecified chronic kidney disease: Secondary | ICD-10-CM

## 2023-02-12 DIAGNOSIS — D692 Other nonthrombocytopenic purpura: Secondary | ICD-10-CM

## 2023-02-12 MED ORDER — LISINOPRIL 10 MG PO TABS: 10.0000 mg | ORAL_TABLET | Freq: Every day | ORAL | 1 refills | Status: DC

## 2023-02-12 MED ORDER — ATORVASTATIN CALCIUM 20 MG PO TABS: 20.0000 mg | ORAL_TABLET | Freq: Every day | ORAL | 1 refills | Status: DC

## 2023-02-12 MED ORDER — ESTRADIOL 0.1 MG/GM VA CREA: TOPICAL_CREAM | VAGINAL | 1 refills | Status: DC

## 2023-02-12 MED ORDER — OMEPRAZOLE 20 MG PO CPDR: 20.0000 mg | DELAYED_RELEASE_CAPSULE | Freq: Every day | ORAL | 1 refills | Status: DC | PRN

## 2023-02-12 NOTE — Assessment & Plan Note (Signed)
Under good control on current regimen. Continue current regimen. Continue to monitor. Call with any concerns. Refills given. Labs drawn today.   

## 2023-02-12 NOTE — Assessment & Plan Note (Signed)
Reassured patient. Continue to monitor.  

## 2023-02-12 NOTE — Progress Notes (Signed)
BP 118/79   Pulse 68   Temp 98 F (36.7 C) (Oral)   Wt 163 lb 9.6 oz (74.2 kg)   SpO2 97%   BMI 27.22 kg/m    Subjective:    Patient ID: Makayla [name], female    DOB: 12/02/1950, 72 y.o.   MRN: [id number]  HPI: Makayla [name] is a 72 y.o. female  Chief Complaint  Patient presents with   Hypertension   Hyperlipidemia   HYPERTENSION / HYPERLIPIDEMIA Satisfied with current treatment? yes Duration of hypertension: chronic BP monitoring frequency: not checking BP medication side effects: no Past BP meds: lisinopril Duration of hyperlipidemia: chronic Cholesterol medication side effects: no Cholesterol supplements: none Past cholesterol medications: atorvastatin Medication compliance: excellent compliance Aspirin: no Recent stressors: no Recurrent headaches: no Visual changes: no Palpitations: no Dyspnea: no Chest pain: no Lower extremity edema: no Dizzy/lightheaded: no  Relevant past medical, surgical, family and social history reviewed and updated as indicated. Interim medical history since our last visit reviewed. Allergies and medications reviewed and updated.  Review of Systems  Constitutional: Negative.   HENT: Negative.    Respiratory: Negative.    Cardiovascular: Negative.   Musculoskeletal:  Positive for back pain, myalgias and neck pain. Negative for arthralgias, gait problem, joint swelling and neck stiffness.  Skin: Negative.   Psychiatric/Behavioral: Negative.      Per HPI unless specifically indicated above     Objective:    BP 118/79   Pulse 68   Temp 98 F (36.7 C) (Oral)   Wt 163 lb 9.6 oz (74.2 kg)   SpO2 97%   BMI 27.22 kg/m   Wt Readings from Last 3 Encounters:  02/12/23 163 lb 9.6 oz (74.2 kg)  12/05/22 160 lb (72.6 kg)  09/11/22 160 lb 12.8 oz (72.9 kg)    Physical Exam Vitals and nursing note reviewed.  Constitutional:      General: She is not in acute distress.    Appearance: Normal appearance. She is not  ill-appearing, toxic-appearing or diaphoretic.  HENT:     Head: Normocephalic and atraumatic.     Right Ear: External ear normal.     Left Ear: External ear normal.     Nose: Nose normal.     Mouth/Throat:     Mouth: Mucous membranes are moist.     Pharynx: Oropharynx is clear.  Eyes:     General: No scleral icterus.       Right eye: No discharge.        Left eye: No discharge.     Extraocular Movements: Extraocular movements intact.     Conjunctiva/sclera: Conjunctivae normal.     Pupils: Pupils are equal, round, and reactive to light.  Cardiovascular:     Rate and Rhythm: Normal rate and regular rhythm.     Pulses: Normal pulses.     Heart sounds: Normal heart sounds. No murmur heard.    No friction rub. No gallop.  Pulmonary:     Effort: Pulmonary effort is normal. No respiratory distress.     Breath sounds: Normal breath sounds. No stridor. No wheezing, rhonchi or rales.  Chest:     Chest wall: No tenderness.  Musculoskeletal:        General: Normal range of motion.     Cervical back: Normal range of motion and neck supple.  Skin:    General: Skin is warm and dry.     Capillary Refill: Capillary refill takes less than 2 seconds.  Coloration: Skin is not jaundiced or pale.     Findings: No bruising, erythema, lesion or rash.  Neurological:     General: No focal deficit present.     Mental Status: She is alert and oriented to person, place, and time. Mental status is at baseline.  Psychiatric:        Mood and Affect: Mood normal.        Behavior: Behavior normal.        Thought Content: Thought content normal.        Judgment: Judgment normal.     Results for orders placed or performed in visit on 09/11/22  Microscopic Examination   Urine  Result Value Ref Range   WBC, UA 0-5 0 - 5 /hpf   RBC, Urine 0-2 0 - 2 /hpf   Epithelial Cells (non renal) 0-10 0 - 10 /hpf   Bacteria, UA None seen None seen/Few  CBC with Differential/Platelet  Result Value Ref Range    WBC 8.4 3.4 - 10.8 x10E3/uL   RBC 3.87 3.77 - 5.28 x10E6/uL   Hemoglobin 12.3 11.1 - 15.9 g/dL   Hematocrit 59.5 63.8 - 46.6 %   MCV 99 (H) 79 - 97 fL   MCH 31.8 26.6 - 33.0 pg   MCHC 32.1 31.5 - 35.7 g/dL   RDW 75.6 43.3 - 29.5 %   Platelets 220 150 - 450 x10E3/uL   Neutrophils 55 Not Estab. %   Lymphs 33 Not Estab. %   Monocytes 9 Not Estab. %   Eos 2 Not Estab. %   Basos 1 Not Estab. %   Neutrophils Absolute 4.6 1.4 - 7.0 x10E3/uL   Lymphocytes Absolute 2.8 0.7 - 3.1 x10E3/uL   Monocytes Absolute 0.7 0.1 - 0.9 x10E3/uL   EOS (ABSOLUTE) 0.2 0.0 - 0.4 x10E3/uL   Basophils Absolute 0.1 0.0 - 0.2 x10E3/uL   Immature Granulocytes 0 Not Estab. %   Immature Grans (Abs) 0.0 0.0 - 0.1 x10E3/uL  Comprehensive metabolic panel  Result Value Ref Range   Glucose 95 70 - 99 mg/dL   BUN 25 8 - 27 mg/dL   Creatinine, Ser 1.88 0.57 - 1.00 mg/dL   eGFR 73 >41 YS/AYT/0.16   BUN/Creatinine Ratio 29 (H) 12 - 28   Sodium 141 134 - 144 mmol/L   Potassium 4.5 3.5 - 5.2 mmol/L   Chloride 105 96 - 106 mmol/L   CO2 22 20 - 29 mmol/L   Calcium 9.8 8.7 - 10.3 mg/dL   Total Protein 6.2 6.0 - 8.5 g/dL   Albumin 4.4 3.9 - 4.9 g/dL   Globulin, Total 1.8 1.5 - 4.5 g/dL   Albumin/Globulin Ratio 2.4 (H) 1.2 - 2.2   Bilirubin Total <0.2 0.0 - 1.2 mg/dL   Alkaline Phosphatase 87 44 - 121 IU/L   AST 17 0 - 40 IU/L   ALT 16 0 - 32 IU/L  Lipid Panel w/o Chol/HDL Ratio  Result Value Ref Range   Cholesterol, Total 196 100 - 199 mg/dL   Triglycerides 010 (H) 0 - 149 mg/dL   HDL 66 >93 mg/dL   VLDL Cholesterol Cal 52 (H) 5 - 40 mg/dL   LDL Chol Calc (NIH) 78 0 - 99 mg/dL  Urinalysis, Routine w reflex microscopic  Result Value Ref Range   Specific Gravity, UA 1.025 1.005 - 1.030   pH, UA 5.5 5.0 - 7.5   Color, UA Yellow Yellow   Appearance Ur Clear Clear   Leukocytes,UA 1+ (A) Negative  Protein,UA Negative Negative/Trace   Glucose, UA Negative Negative   Ketones, UA Negative Negative   RBC, UA Trace  (A) Negative   Bilirubin, UA Negative Negative   Urobilinogen, Ur 0.2 0.2 - 1.0 mg/dL   Nitrite, UA Negative Negative   Microscopic Examination See below:   TSH  Result Value Ref Range   TSH 2.550 0.450 - 4.500 uIU/mL  Microalbumin, Urine Waived  Result Value Ref Range   Microalb, Ur Waived 30 (H) 0 - 19 mg/L   Creatinine, Urine Waived 200 10 - 300 mg/dL   Microalb/Creat Ratio <30 <30 mg/g      Assessment & Plan:   Problem List Items Addressed This Visit       Cardiovascular and Mediastinum   Senile purpura (HCC)    Reassured patient. Continue to monitor.       Relevant Medications   atorvastatin (LIPITOR) 20 MG tablet   lisinopril (ZESTRIL) 10 MG tablet   Other Relevant Orders   CBC with Differential/Platelet   Comprehensive metabolic panel     Genitourinary   Benign hypertensive renal disease    Under good control on current regimen. Continue current regimen. Continue to monitor. Call with any concerns. Refills given. Labs drawn today.        Relevant Orders   Comprehensive metabolic panel     Other   Hyperlipidemia - Primary    Under good control on current regimen. Continue current regimen. Continue to monitor. Call with any concerns. Refills given. Labs drawn today.       Relevant Medications   atorvastatin (LIPITOR) 20 MG tablet   lisinopril (ZESTRIL) 10 MG tablet   Other Relevant Orders   Comprehensive metabolic panel   Lipid Panel w/o Chol/HDL Ratio   Other Visit Diagnoses     Chronic neck pain       Following with PM&R. Not doing well right now. Scheduled for MRI. Call with any concerns.        Follow up plan: Return in about 7 months (around 09/12/2023) for physical.

## 2023-02-13 LAB — CBC WITH DIFFERENTIAL/PLATELET
Basophils Absolute: 0.1 10*3/uL (ref 0.0–0.2)
Basos: 1 %
EOS (ABSOLUTE): 0.1 10*3/uL (ref 0.0–0.4)
Eos: 1 %
Hematocrit: 40.7 % (ref 34.0–46.6)
Hemoglobin: 13.1 g/dL (ref 11.1–15.9)
Immature Grans (Abs): 0 10*3/uL (ref 0.0–0.1)
Immature Granulocytes: 0 %
Lymphocytes Absolute: 2.2 10*3/uL (ref 0.7–3.1)
Lymphs: 26 %
MCH: 32.5 pg (ref 26.6–33.0)
MCHC: 32.2 g/dL (ref 31.5–35.7)
MCV: 101 fL — ABNORMAL HIGH (ref 79–97)
Monocytes Absolute: 0.6 10*3/uL (ref 0.1–0.9)
Monocytes: 7 %
Neutrophils Absolute: 5.4 10*3/uL (ref 1.4–7.0)
Neutrophils: 65 %
Platelets: 217 10*3/uL (ref 150–450)
RBC: 4.03 x10E6/uL (ref 3.77–5.28)
RDW: 12.1 % (ref 11.7–15.4)
WBC: 8.4 10*3/uL (ref 3.4–10.8)

## 2023-02-13 LAB — COMPREHENSIVE METABOLIC PANEL
ALT: 17 [IU]/L (ref 0–32)
AST: 19 [IU]/L (ref 0–40)
Albumin: 4.4 g/dL (ref 3.9–4.9)
Alkaline Phosphatase: 91 [IU]/L (ref 44–121)
BUN/Creatinine Ratio: 23 (ref 12–28)
BUN: 16 mg/dL (ref 8–27)
Bilirubin Total: <0.2 mg/dL (ref 0.0–1.2)
CO2: 28 mmol/L (ref 20–29)
Calcium: 9.6 mg/dL (ref 8.7–10.3)
Chloride: 103 mmol/L (ref 96–106)
Creatinine, Ser: 0.69 mg/dL (ref 0.57–1.00)
Globulin, Total: 1.9 g/dL (ref 1.5–4.5)
Glucose: 92 mg/dL (ref 70–99)
Potassium: 4.8 mmol/L (ref 3.5–5.2)
Sodium: 142 mmol/L (ref 134–144)
Total Protein: 6.3 g/dL (ref 6.0–8.5)
eGFR: 93 mL/min/{1.73_m2} (ref >59)

## 2023-02-13 LAB — LIPID PANEL W/O CHOL/HDL RATIO
Cholesterol, Total: 202 mg/dL — ABNORMAL HIGH (ref 100–199)
HDL: 67 mg/dL (ref >39)
LDL Chol Calc (NIH): 95 mg/dL (ref 0–99)
Triglycerides: 240 mg/dL — ABNORMAL HIGH (ref 0–149)
VLDL Cholesterol Cal: 40 mg/dL (ref 5–40)

## 2023-02-22 ENCOUNTER — Ambulatory Visit
Admission: RE | Admit: 2023-02-22 | Discharge: 2023-02-22 | Disposition: A | Discharge status: Home / Self Care | Payer: HMO | Source: Ambulatory Visit | Attending: Physical Medicine and Rehabilitation | Admitting: Physical Medicine and Rehabilitation

## 2023-02-22 DIAGNOSIS — M5412 Radiculopathy, cervical region: Secondary | ICD-10-CM

## 2023-03-05 DIAGNOSIS — M47816 Spondylosis without myelopathy or radiculopathy, lumbar region: Secondary | ICD-10-CM | POA: Diagnosis not present

## 2023-03-05 DIAGNOSIS — M62838 Other muscle spasm: Secondary | ICD-10-CM | POA: Diagnosis not present

## 2023-03-05 DIAGNOSIS — M5412 Radiculopathy, cervical region: Secondary | ICD-10-CM | POA: Diagnosis not present

## 2023-03-05 DIAGNOSIS — M47812 Spondylosis without myelopathy or radiculopathy, cervical region: Secondary | ICD-10-CM | POA: Diagnosis not present

## 2023-04-02 ENCOUNTER — Encounter: Payer: Self-pay | Admitting: Physician Assistant

## 2023-04-02 ENCOUNTER — Ambulatory Visit: Payer: Self-pay | Admitting: *Deleted

## 2023-04-02 ENCOUNTER — Ambulatory Visit (INDEPENDENT_AMBULATORY_CARE_PROVIDER_SITE_OTHER): Payer: HMO | Admitting: Physician Assistant

## 2023-04-02 VITALS — BP 118/80 | HR 94 | Temp 97.3°F | Resp 16 | Ht 65.0 in | Wt 161.7 lb

## 2023-04-02 DIAGNOSIS — M47812 Spondylosis without myelopathy or radiculopathy, cervical region: Secondary | ICD-10-CM | POA: Diagnosis not present

## 2023-04-02 DIAGNOSIS — N390 Urinary tract infection, site not specified: Secondary | ICD-10-CM

## 2023-04-02 DIAGNOSIS — R300 Dysuria: Secondary | ICD-10-CM

## 2023-04-02 DIAGNOSIS — R319 Hematuria, unspecified: Secondary | ICD-10-CM | POA: Diagnosis not present

## 2023-04-02 LAB — POCT URINALYSIS DIPSTICK
Bilirubin, UA: NEGATIVE
Glucose, UA: NEGATIVE
Ketones, UA: NEGATIVE
Nitrite, UA: POSITIVE
Protein, UA: NEGATIVE
Spec Grav, UA: 1.01 (ref 1.010–1.025)
Urobilinogen, UA: 0.2 U/dL
pH, UA: 5 (ref 5.0–8.0)

## 2023-04-02 MED ORDER — PHENAZOPYRIDINE HCL 100 MG PO TABS
100.0000 mg | ORAL_TABLET | Freq: Three times a day (TID) | ORAL | 0 refills | Status: DC | PRN
Start: 2023-04-02 — End: 2023-09-12

## 2023-04-02 MED ORDER — SULFAMETHOXAZOLE-TRIMETHOPRIM 800-160 MG PO TABS
1.0000 | ORAL_TABLET | Freq: Two times a day (BID) | ORAL | 0 refills | Status: AC
Start: 2023-04-02 — End: 2023-04-07

## 2023-04-02 NOTE — Telephone Encounter (Signed)
Summary: UTI sx Advice   Pt is calling to report UTI sx with frequent urination, and pain with urination. No available appts at [facility]. Please advise

## 2023-04-02 NOTE — Telephone Encounter (Signed)
  Chief Complaint: pain, frequency with urination Symptoms: pain with urination, nocturia, frequency Frequency: 2-3 days Pertinent Negatives: Patient denies fever Disposition: [] ED /[] Urgent Care (no appt availability in office) / [x] Appointment(In office/virtual)/ []  [place] [facility]/ [] Home Care/ [] Refused Recommended Disposition /[] [place] [facility]/ []  Follow-up with PCP Additional Notes: Patient has been scheduled with float provider   Reason for Disposition  Age > 50 years  Answer Assessment - Initial Assessment Questions 1. SEVERITY: "How bad is the pain?"  (e.g., Scale 1-10; mild, moderate, or severe)   - MILD (1-3): complains slightly about urination hurting   - MODERATE (4-7): interferes with normal activities     - SEVERE (8-10): excruciating, unwilling or unable to urinate because of the pain      5/10 2. FREQUENCY: "How many times have you had painful urination today?"      twice 3. PATTERN: "Is pain present every time you urinate or just sometimes?"      Worse as day goes on 4. ONSET: "When did the painful urination start?"      2-3 days 5. FEVER: "Do you have a fever?" If Yes, ask: "What is your temperature, how was it measured, and when did it start?"     no 6. PAST UTI: "Have you had a urine infection before?" If Yes, ask: "When was the last time?" and "What happened that time?"      Yes 7. CAUSE: "What do you think is causing the painful urination?"  (e.g., UTI, scratch, Herpes sore)     UTI 8. OTHER SYMPTOMS: "Do you have any other symptoms?" (e.g., blood in urine, flank pain, genital sores, urgency, vaginal discharge)     Flank pain, nocturia  Protocols used: Urination Pain - Female-A-AH

## 2023-04-02 NOTE — Patient Instructions (Signed)
Based on your symptoms and results of the urinalysis I believe you have a UTI I recommend the following:  I have sent in a script for bactrim to be taken by mouth twice per day for 5 days   Please finish the entire course of the antibiotic even if you are feeling better before it is completed unless you have an allergic reaction or are instructed to stop taking it by a provider.  Stay well hydrated (at least 75 oz of water per day) and avoid holding your urine If you have any of the following please let us know: persistent symptoms, fever, trouble urinating or inability to urinate, confusion, flank pain.

## 2023-04-02 NOTE — Progress Notes (Signed)
Acute Office Visit   Patient: Makayla [name]   DOB: 24-Jan-1951   72 y.o. Female  MRN: [id number] Visit Date: 04/02/2023  Today's healthcare provider: Oswaldo Conroy Baylee Mccorkel, PA-C  Introduced myself to the patient as a Secondary school teacher and provided education on APPs in clinical practice.    Chief Complaint  Patient presents with   Dysuria   Urinary Frequency   Subjective    HPI   Concern for UTI  Onset: gradual  Duration: 3 days Associated symptoms; Dysuria, reports pain with urination, increased frequency- states she is having to get up at night which is unusual for her, urinary hesitancy  Interventions: nothing     Medications: Outpatient Medications Prior to Visit  Medication Sig   atorvastatin (LIPITOR) 20 MG tablet Take 1 tablet (20 mg total) by mouth daily.   estradiol (ESTRACE) 0.1 MG/GM vaginal cream Place 0.5g (fingertip amount) nightly for two weeks then twice a week after.   lisinopril (ZESTRIL) 10 MG tablet Take 1 tablet (10 mg total) by mouth daily.   Multiple Vitamins-Minerals (MULTIVITAMIN WITH MINERALS) tablet Take 1 tablet by mouth daily.   omeprazole (PRILOSEC) 20 MG capsule Take 1 capsule (20 mg total) by mouth daily as needed. TAKE 1 CAPSULE BY MOUTH EVERY DAY   OVER THE COUNTER MEDICATION Vitamin b 12 daily   polyethylene glycol powder (GLYCOLAX/MIRALAX) 17 GM/SCOOP powder Take 17 g by mouth daily. Drink 17g (1 scoop) dissolved in water per day.   No facility-administered medications prior to visit.    Review of Systems  Constitutional:  Negative for chills, fatigue and fever.  Gastrointestinal:  Negative for abdominal pain, diarrhea, nausea and vomiting.  Genitourinary:  Positive for dysuria, frequency and vaginal pain. Negative for decreased urine volume, difficulty urinating, hematuria, urgency, vaginal bleeding and vaginal discharge.  Neurological:  Negative for dizziness, light-headedness and headaches.        Objective    BP 118/80   Pulse 94    Temp (!) 97.3 F (36.3 C) (Temporal)   Resp 16   Ht 5\' 5"  (1.651 m)   Wt 161 lb 11.2 oz (73.3 kg)   SpO2 96%   BMI 26.91 kg/m     Physical Exam Vitals reviewed.  Constitutional:      General: She is awake.     Appearance: Normal appearance. She is well-developed and well-groomed.  HENT:     Head: Normocephalic and atraumatic.  Eyes:     General: Lids are normal. Gaze aligned appropriately.     Extraocular Movements: Extraocular movements intact.     Conjunctiva/sclera: Conjunctivae normal.  Pulmonary:     Effort: Pulmonary effort is normal.  Neurological:     General: No focal deficit present.     Mental Status: She is alert and oriented to person, place, and time.     GCS: GCS eye subscore is 4. GCS verbal subscore is 5. GCS motor subscore is 6.     Cranial Nerves: No cranial nerve deficit, dysarthria or facial asymmetry.  Psychiatric:        Attention and Perception: Attention and perception normal.        Mood and Affect: Mood and affect normal.        Speech: Speech normal.        Behavior: Behavior normal. Behavior is cooperative.       Results for orders placed or performed in visit on 04/02/23  POCT urinalysis dipstick  Result Value  Ref Range   Color, UA yellow    Clarity, UA cloudy    Glucose, UA Negative Negative   Bilirubin, UA Negative    Ketones, UA Negative    Spec Grav, UA 1.010 1.010 - 1.025   Blood, UA Large    pH, UA 5.0 5.0 - 8.0   Protein, UA Negative Negative   Urobilinogen, UA 0.2 0.2 or 1.0 E.U./dL   Nitrite, UA Positive    Leukocytes, UA Large (3+) (A) Negative   Appearance yellow    Odor foul     Assessment & Plan      No follow-ups on file.      Problem List Items Addressed This Visit   None Visit Diagnoses     Urinary tract infection with hematuria, site unspecified    -  Primary   Relevant Medications   sulfamethoxazole-trimethoprim (BACTRIM DS) 800-160 MG tablet   phenazopyridine (PYRIDIUM) 100 MG tablet   Dysuria        Relevant Medications   phenazopyridine (PYRIDIUM) 100 MG tablet   Other Relevant Orders   POCT urinalysis dipstick (Completed)   Urine Culture      Acute, new problem Patient reports symptoms comprised of the following: dysuria, pressure, incomplete voiding, increased urinary frequency for the past 3 days  Results of UA are consistent with UTI - urine sample sent for culture to determine causative organism and susceptibility- results to dictate further management  Reviewed urine dip results with her during apt. She declines cervicovaginal swab today for BV, trich and yeast (informed her that she can still have yeast and BV infections despite not being sexually active for several years but she declined testing today)  Will provide script for bactrim  bid x 5 days  discussed importance of finishing entire course of abx and staying well hydrated while recovering from UTI Will send in script for pyridium 100 mg PO TID PRN to assist with discomfort  Reviewed ED precautions with patient Follow up as needed for persistent or worsening symptoms   No follow-ups on file.   I, Kamarion Zagami E Fia Hebert, PA-C, have reviewed all documentation for this visit. The documentation on 04/02/23 for the exam, diagnosis, procedures, and orders are all accurate and complete.   Jacquelin Hawking, MHS, PA-C [facility]

## 2023-04-05 LAB — URINE CULTURE
MICRO NUMBER:: 15744736
SPECIMEN QUALITY:: ADEQUATE

## 2023-04-06 NOTE — Progress Notes (Signed)
Your urine culture results are back and demonstrates bacterial infection with Klebsiella pneumoniae  The antibiotic that you were started on was found to be effective against this bacteria. Please make sure you finish the entire course and stay well hydrated. Please call us if you have further questions or concerns.

## 2023-04-17 DIAGNOSIS — M47812 Spondylosis without myelopathy or radiculopathy, cervical region: Secondary | ICD-10-CM | POA: Diagnosis not present

## 2023-04-25 ENCOUNTER — Ambulatory Visit: Payer: Self-pay | Admitting: *Deleted

## 2023-04-25 ENCOUNTER — Ambulatory Visit (INDEPENDENT_AMBULATORY_CARE_PROVIDER_SITE_OTHER): Payer: HMO | Admitting: Pediatrics

## 2023-04-25 ENCOUNTER — Encounter: Payer: Self-pay | Admitting: Pediatrics

## 2023-04-25 ENCOUNTER — Other Ambulatory Visit: Payer: Self-pay | Admitting: Pediatrics

## 2023-04-25 VITALS — BP 113/79 | HR 81 | Temp 97.7°F | Resp 16 | Wt 165.0 lb

## 2023-04-25 DIAGNOSIS — R102 Pelvic and perineal pain: Secondary | ICD-10-CM

## 2023-04-25 DIAGNOSIS — N3000 Acute cystitis without hematuria: Secondary | ICD-10-CM

## 2023-04-25 DIAGNOSIS — Z1330 Encounter for screening examination for mental health and behavioral disorders, unspecified: Secondary | ICD-10-CM

## 2023-04-25 DIAGNOSIS — R112 Nausea with vomiting, unspecified: Secondary | ICD-10-CM

## 2023-04-25 DIAGNOSIS — K529 Noninfective gastroenteritis and colitis, unspecified: Secondary | ICD-10-CM

## 2023-04-25 DIAGNOSIS — R399 Unspecified symptoms and signs involving the genitourinary system: Secondary | ICD-10-CM

## 2023-04-25 DIAGNOSIS — N3289 Other specified disorders of bladder: Secondary | ICD-10-CM | POA: Diagnosis not present

## 2023-04-25 LAB — URINALYSIS, ROUTINE W REFLEX MICROSCOPIC
Bilirubin, UA: NEGATIVE
Glucose, UA: NEGATIVE
Ketones, UA: NEGATIVE
Nitrite, UA: POSITIVE — AB
Protein,UA: NEGATIVE
Specific Gravity, UA: 1.015 (ref 1.005–1.030)
Urobilinogen, Ur: 0.2 mg/dL (ref 0.2–1.0)
pH, UA: 6 (ref 5.0–7.5)

## 2023-04-25 LAB — WET PREP FOR TRICH, YEAST, CLUE
Clue Cell Exam: NEGATIVE
Trichomonas Exam: NEGATIVE
Yeast Exam: NEGATIVE

## 2023-04-25 LAB — MICROSCOPIC EXAMINATION

## 2023-04-25 MED ORDER — ONDANSETRON HCL 4 MG PO TABS: 4.0000 mg | ORAL_TABLET | Freq: Three times a day (TID) | ORAL | 0 refills | Status: DC | PRN

## 2023-04-25 MED ORDER — NITROFURANTOIN MONOHYD MACRO 100 MG PO CAPS: 100.0000 mg | ORAL_CAPSULE | Freq: Two times a day (BID) | ORAL | 0 refills | Status: AC

## 2023-04-25 NOTE — Progress Notes (Signed)
Office Visit  BP 113/79 (BP Location: Left Arm, Patient Position: Sitting, Cuff Size: Normal)   Pulse 81   Temp 97.7 F (36.5 C) (Oral)   Resp 16   Wt 165 lb (74.8 kg)   SpO2 96%   BMI 27.46 kg/m    Subjective:    Patient ID: Makayla [name], female    DOB: [birth year], 72 y.o.   MRN: [id number]  HPI: Makayla [name] is a 72 y.o. female  Chief Complaint  Patient presents with   Emesis    Started yesterday, every 15 mins over night. Not in the last hour.    Diarrhea    Same as emesis. Nothing but water. Can keep water down but nothing more.    Urinary Tract Infection    Pain, resolved but started again. Started again a couple days ago. Has wet pants a few times. Does have mesh and maybe needs to be tightened.    Spasms    Had resolved from previous episodes but feels it worse now.     Discussed the use of AI scribe software for clinical note transcription with the patient, who gave verbal consent to proceed.  History of Present Illness   The patient, with a history of bladder mesh placement two years ago, presented with acute onset of vomiting and diarrhea starting the previous day. The vomiting was persistent throughout the day and night, and the diarrhea was described as watery. The patient was unable to keep any food down and had not attempted to eat since the onset of symptoms.  A few days prior to the onset of these symptoms, the patient noticed pain during urination, suggestive of a possible urinary tract infection (UTI). The patient reported increased urinary frequency but denied hematuria.  The patient also reported a transient episode of severe pain in the vaginal area, described as sharp in nature. No other associated symptoms such as fever, chills, or respiratory symptoms were reported. The patient denied any recent dietary changes or exposure to sick contacts.  The patient has a history of UTIs post bladder mesh placement, with the most recent episode in  November. The patient denied any other new symptoms or changes in health.      Relevant past medical, surgical, family and social history reviewed and updated as indicated. Interim medical history since our last visit reviewed. Allergies and medications reviewed and updated.  ROS per HPI unless specifically indicated above     Objective:    BP 113/79 (BP Location: Left Arm, Patient Position: Sitting, Cuff Size: Normal)   Pulse 81   Temp 97.7 F (36.5 C) (Oral)   Resp 16   Wt 165 lb (74.8 kg)   SpO2 96%   BMI 27.46 kg/m   Wt Readings from Last 3 Encounters:  04/25/23 165 lb (74.8 kg)  04/02/23 161 lb 11.2 oz (73.3 kg)  02/12/23 163 lb 9.6 oz (74.2 kg)     Physical Exam Constitutional:      Appearance: Normal appearance.  HENT:     Head: Normocephalic and atraumatic.  Eyes:     Pupils: Pupils are equal, round, and reactive to light.  Cardiovascular:     Rate and Rhythm: Normal rate and regular rhythm.     Pulses: Normal pulses.     Heart sounds: Normal heart sounds.  Pulmonary:     Effort: Pulmonary effort is normal.     Breath sounds: Normal breath sounds.  Abdominal:     General: Abdomen  is flat.     Palpations: Abdomen is soft.  Musculoskeletal:        General: Normal range of motion.     Cervical back: Normal range of motion.  Skin:    General: Skin is warm and dry.     Capillary Refill: Capillary refill takes less than 2 seconds.  Neurological:     General: No focal deficit present.     Mental Status: She is alert. Mental status is at baseline.  Psychiatric:        Mood and Affect: Mood normal.        Behavior: Behavior normal.         04/25/2023    9:33 AM 04/02/2023   12:50 PM 02/12/2023    9:06 AM 12/05/2022   10:34 AM 02/01/2022    9:40 AM  Depression screen PHQ 2/9  Decreased Interest 0 0 0 0 0  Down, Depressed, Hopeless 0 0 0 0 0  PHQ - 2 Score 0 0 0 0 0  Altered sleeping 0 0 0 0 0  Tired, decreased energy 0 0 0 0 0  Change in appetite 0 0  0 0 0  Feeling bad or failure about yourself  0 0 0 0 0  Trouble concentrating 0 0 0 0 0  Moving slowly or fidgety/restless 0 0 0 0 0  Suicidal thoughts 0 0 0 0 0  PHQ-9 Score 0 0 0 0 0  Difficult doing work/chores Not difficult at all Not difficult at all Not difficult at all Not difficult at all        04/25/2023    9:33 AM 02/12/2023    9:06 AM 02/01/2022    9:41 AM 09/02/2021    8:14 AM  GAD 7 : Generalized Anxiety Score  Nervous, Anxious, on Edge 0 0 0 0  Control/stop worrying 0 0 0 0  Worry too much - different things 0 0 0 0  Trouble relaxing 0 0 0 0  Restless 0 0 0 0  Easily annoyed or irritable 0 0 0 0  Afraid - awful might happen 0 0 0 0  Total GAD 7 Score 0 0 0 0  Anxiety Difficulty Not difficult at all Not difficult at all Not difficult at all        Assessment & Plan:  Assessment & Plan   Gastroenteritis Nausea and vomiting, unspecified vomiting type Sudden onset of vomiting and diarrhea, no recent dietary changes or sick contacts. No associated fever, cough, or congestion. Unable to keep food down. Exam unremarkable. Suspect viral gastroenteritis. May additionally have symptoms due to untreated UTI. Pt given strict return precautions and anticipatory guidance for ED. -Start Zofran 4mg  every 6-8 hours as needed for nausea. -Encourage intake of electrolyte-rich fluids like Pedialyte or Gatorade. -If symptoms persist or worsen, patient to return to clinic by 04/27/2023. -     Ondansetron HCl; Take 1 tablet (4 mg total) by mouth every 8 (eight) hours as needed for nausea or vomiting.  Dispense: 20 tablet; Refill: 0  Urinary tract infection symptoms Dysuria and increased urinary frequency for a few days prior to onset of GI symptoms. No hematuria. History of bladder mesh placement with subsequent UTIs. Consider referral to urology if ongoing recurrence. -Collect urine sample for urinalysis and culture. -Will call patient with results and start antibiotics if  indicated. -     Urinalysis, Routine w reflex microscopic -     Urine Culture  Vaginal Pain Sudden onset of  sharp vaginal pain. No associated itching or discomfort. -Perform self-swab for yeast and other vaginal pathogens. -Will call patient with results and start treatment if indicated. -     WET PREP FOR TRICH, YEAST, CLUE  Encounter for behavioral health screening As part of their intake evaluation, the patient was screened for depression, anxiety.  PHQ9 SCORE 0, GAD7 SCORE 0. Screening results negative for tested conditions.    Follow up plan: Return if symptoms worsen or fail to improve.  Geran Haithcock Howell Pringle, MD

## 2023-04-25 NOTE — Telephone Encounter (Signed)
FYI to provider who saw the patient today.

## 2023-04-25 NOTE — Patient Instructions (Addendum)
Zofran as needed for vomiting Be sure to stay hydrated If not improving, please return

## 2023-04-25 NOTE — Telephone Encounter (Signed)
Reason for Disposition . [1] MILD or MODERATE vomiting AND [2] present > 48 hours (2 days) (Exception: Mild vomiting with associated diarrhea.)  Answer Assessment - Initial Assessment Questions 1. VOMITING SEVERITY: "How many times have you vomited in the past 24 hours?"     - MILD:  1 - 2 times/day    - MODERATE: 3 - 5 times/day, decreased oral intake without significant weight loss or symptoms of dehydration    - SEVERE: 6 or more times/day, vomits everything or nearly everything, with significant weight loss, symptoms of dehydration      Started yesterday morning with diarrhea and vomiting.   I've had fullness feeling.   I could not eat all day yesterday.    I'm drinking a lot of water.  It is staying down.    I think I have another UTI.   I'm having pain with urination.   They gave me an antibiotic for the UTI.    2-3 weeks ago I was treated for a UTI.    I had to go to [place] to be seen.   2. ONSET: "When did the vomiting begin?"      Yesterday 3. FLUIDS: "What fluids or food have you vomited up today?" "Have you been able to keep any fluids down?"     Everything except water. 4. ABDOMEN PAIN: "Are your having any abdomen pain?" If Yes : "How bad is it and what does it feel like?" (e.g., crampy, dull, intermittent, constant)      Fullness 5. DIARRHEA: "Is there any diarrhea?" If Yes, ask: "How many times today?"      Yes 6. CONTACTS: "Is there anyone else in the family with the same symptoms?"      No    7. CAUSE: "What do you think is causing your vomiting?"     I don't know 8. HYDRATION STATUS: "Any signs of dehydration?" (e.g., dry mouth [not only dry lips], too weak to stand) "When did you last urinate?"     No  I'm drinking a lot of water.   That's the only thing staying down.    9. OTHER SYMPTOMS: "Do you have any other symptoms?" (e.g., fever, headache, vertigo, vomiting blood or coffee grounds, recent head injury)     See above 10. PREGNANCY: "Is there any chance you are  pregnant?" "When was your last menstrual period?"       N/A due to age  Protocols used: Vomiting-A-AH

## 2023-04-25 NOTE — Telephone Encounter (Signed)
  Chief Complaint: Diarrhea, vomiting and UTI  Symptoms: Pain with urination.   Treated 2-3 wks ago for UTI.   Diarrhea and vomiting since yesterday morning.   Keeping water down only Frequency: Since yesterday morning Pertinent Negatives: Patient denies fever.     Disposition: [] ED /[] Urgent Care (no appt availability in office) / [x] Appointment(In office/virtual)/ []  [facility]/ [] Home Care/ [] Refused Recommended Disposition /[]  Mobile Bus/ []  Follow-up with PCP Additional Notes: Appt made for today with Dr. Evelene Croon for 9:40.    Encouraged her to continue drinking a lot of water and fluids.

## 2023-04-25 NOTE — Telephone Encounter (Signed)
  Chief Complaint: Patient was in office today- she wants provider to know she has started seeing blood in urine Symptoms: pink/red blood in urine Frequency: twice this afternoon Pertinent Negatives: Patient denies fever Disposition: [] ED /[] Urgent Care (no appt availability in office) / [] Appointment(In office/virtual)/ []  [facility]/ [] Home Care/ [] Refused Recommended Disposition /[] [facility]/ [x]  Follow-up with PCP Additional Notes: Patient has called to let provider know she has increased symptoms- encouraged increased fluids, sitz bath for comfort, take medication as directed- call back if she develops fever

## 2023-04-25 NOTE — Telephone Encounter (Signed)
Message from Alma T sent at 04/25/2023  4:18 PM EST  Summary: blood in toilet   Patient called stated when she used the urinated it was bright red blood in the toilet and when she wiped. She said she had just spoke to the nurse and she just wanted someone to know. Please f/u with patient         Reason for Disposition  Blood in urine  (Exception: Could be normal menstrual bleeding.)  Answer Assessment - Initial Assessment Questions 1. COLOR of URINE: "Describe the color of the urine."  (e.g., tea-colored, pink, red, bloody) "Do you have blood clots in your urine?" (e.g., none, pea, grape, small coin)     Pink at first, now red 2. ONSET: "When did the bleeding start?"      This afternoon- 1 hour ago 3. EPISODES: "How many times has there been blood in the urine?" or "How many times today?"     twice 4. PAIN with URINATION: "Is there any pain with passing your urine?" If Yes, ask: "How bad is the pain?"  (Scale 1-10; or mild, moderate, severe)    - MILD: Complains slightly about urination hurting.    - MODERATE: Interferes with normal activities.      - SEVERE: Excruciating, unwilling or unable to urinate because of the pain.      Yes- 10/10- patient is getting AZO for pain- feels raw burning when not urinating  5. FEVER: "Do you have a fever?" If Yes, ask: "What is your temperature, how was it measured, and when did it start?"     no 6. ASSOCIATED SYMPTOMS: "Are you passing urine more frequently than usual?"     Yes 7. OTHER SYMPTOMS: "Do you have any other symptoms?" (e.g., back/flank pain, abdomen pain, vomiting)     Vomiting has stopped  Protocols used: Urine - Blood In-A-AH

## 2023-04-27 NOTE — Telephone Encounter (Signed)
Called and spoke to patient. States she feels like symptoms are a little better than before. States she has been drinking a lot of water and using Azo.

## 2023-04-28 LAB — URINE CULTURE

## 2023-05-01 ENCOUNTER — Other Ambulatory Visit: Payer: Self-pay | Admitting: Pediatrics

## 2023-05-01 DIAGNOSIS — N3001 Acute cystitis with hematuria: Secondary | ICD-10-CM

## 2023-05-01 MED ORDER — SULFAMETHOXAZOLE-TRIMETHOPRIM 800-160 MG PO TABS: 1.0000 | ORAL_TABLET | Freq: Two times a day (BID) | ORAL | 0 refills | Status: AC

## 2023-05-01 NOTE — Telephone Encounter (Signed)
Patient notified and made aware. Patient verbalized understanding and has no further questions.

## 2023-05-01 NOTE — Telephone Encounter (Signed)
Called patient to follow up per Dr Evelene Croon. Per patient, she has completed her course of treatment yesterday, and she says she is still experiencing some burning when she urinates. Please advise?

## 2023-05-01 NOTE — Progress Notes (Signed)
Bactrim sent given culture findings and persistent symptoms.

## 2023-05-04 DIAGNOSIS — M47812 Spondylosis without myelopathy or radiculopathy, cervical region: Secondary | ICD-10-CM | POA: Diagnosis not present

## 2023-06-13 ENCOUNTER — Encounter: Payer: Self-pay | Admitting: Family Medicine

## 2023-06-13 ENCOUNTER — Ambulatory Visit: Payer: HMO | Admitting: Family Medicine

## 2023-06-13 DIAGNOSIS — Z23 Encounter for immunization: Secondary | ICD-10-CM | POA: Diagnosis not present

## 2023-06-13 NOTE — Telephone Encounter (Signed)
Nurse visit for a flu shot has been made

## 2023-06-18 DIAGNOSIS — M47812 Spondylosis without myelopathy or radiculopathy, cervical region: Secondary | ICD-10-CM | POA: Diagnosis not present

## 2023-06-29 DIAGNOSIS — H2513 Age-related nuclear cataract, bilateral: Secondary | ICD-10-CM | POA: Diagnosis not present

## 2023-06-29 DIAGNOSIS — H43391 Other vitreous opacities, right eye: Secondary | ICD-10-CM | POA: Diagnosis not present

## 2023-06-29 DIAGNOSIS — I10 Essential (primary) hypertension: Secondary | ICD-10-CM | POA: Diagnosis not present

## 2023-08-01 DIAGNOSIS — M25551 Pain in right hip: Secondary | ICD-10-CM | POA: Diagnosis not present

## 2023-08-22 ENCOUNTER — Encounter: Payer: Self-pay | Admitting: Family Medicine

## 2023-08-22 ENCOUNTER — Other Ambulatory Visit: Payer: Self-pay | Admitting: Family Medicine

## 2023-08-22 DIAGNOSIS — Z1231 Encounter for screening mammogram for malignant neoplasm of breast: Secondary | ICD-10-CM

## 2023-08-23 ENCOUNTER — Ambulatory Visit
Admission: RE | Admit: 2023-08-23 | Discharge: 2023-08-23 | Disposition: A | Discharge status: Home / Self Care | Source: Ambulatory Visit | Attending: Family Medicine | Admitting: Family Medicine

## 2023-08-23 DIAGNOSIS — Z1231 Encounter for screening mammogram for malignant neoplasm of breast: Secondary | ICD-10-CM | POA: Insufficient documentation

## 2023-08-28 ENCOUNTER — Encounter: Payer: Self-pay | Admitting: Family Medicine

## 2023-09-05 ENCOUNTER — Other Ambulatory Visit: Payer: Self-pay | Admitting: Family Medicine

## 2023-09-05 NOTE — Telephone Encounter (Signed)
 Needs refill before upcoming appointment.

## 2023-09-05 NOTE — Telephone Encounter (Signed)
 Requested medication (s) are due for refill today:   Yes  Requested medication (s) are on the active medication list:   Yes  Future visit scheduled:   Yes 4/30 with Dr. Lincoln Renshaw   LOV 06/13/2023   Last ordered: 02/12/2023 #90, 1 refill    Unable to refill because labs are due per protocol   Requested Prescriptions  Pending Prescriptions Disp Refills   lisinopril  (ZESTRIL ) 10 MG tablet [Pharmacy Med Name: LISINOPRIL  10 MG TABLET] 90 tablet 1    Sig: TAKE 1 TABLET BY MOUTH EVERY DAY     Cardiovascular:  ACE Inhibitors Failed - 09/05/2023  2:35 PM      Failed - Cr in normal range and within 180 days    Creatinine, Ser  Date Value Ref Range Status  02/12/2023 0.69 0.57 - 1.00 mg/dL Final         Failed - K in normal range and within 180 days    Potassium  Date Value Ref Range Status  02/12/2023 4.8 3.5 - 5.2 mmol/L Final         Failed - Valid encounter within last 6 months    Recent Outpatient Visits   None     Future Appointments             In 1 week Lincoln Renshaw, Megan P, DO [facility]            Passed - Patient is not pregnant      Passed - Last BP in normal range    BP Readings from Last 1 Encounters:  04/25/23 113/79

## 2023-09-12 ENCOUNTER — Ambulatory Visit (INDEPENDENT_AMBULATORY_CARE_PROVIDER_SITE_OTHER): Payer: Self-pay | Admitting: Family Medicine

## 2023-09-12 ENCOUNTER — Encounter: Payer: Self-pay | Admitting: Family Medicine

## 2023-09-12 VITALS — BP 114/79 | HR 61 | Ht 66.0 in | Wt 163.2 lb

## 2023-09-12 DIAGNOSIS — D692 Other nonthrombocytopenic purpura: Secondary | ICD-10-CM

## 2023-09-12 DIAGNOSIS — E782 Mixed hyperlipidemia: Secondary | ICD-10-CM | POA: Diagnosis not present

## 2023-09-12 DIAGNOSIS — I129 Hypertensive chronic kidney disease with stage 1 through stage 4 chronic kidney disease, or unspecified chronic kidney disease: Secondary | ICD-10-CM | POA: Diagnosis not present

## 2023-09-12 DIAGNOSIS — Z0000 Encounter for general adult medical examination without abnormal findings: Secondary | ICD-10-CM | POA: Diagnosis not present

## 2023-09-12 LAB — MICROALBUMIN, URINE WAIVED
Creatinine, Urine Waived: 50 mg/dL (ref 10–300)
Microalb, Ur Waived: 30 mg/L — ABNORMAL HIGH (ref 0–19)
Microalb/Creat Ratio: <30 mg/g (ref <30)

## 2023-09-12 MED ORDER — ESTRADIOL 0.1 MG/GM VA CREA: TOPICAL_CREAM | VAGINAL | 1 refills | Status: DC

## 2023-09-12 MED ORDER — OMEPRAZOLE 20 MG PO CPDR: 20.0000 mg | DELAYED_RELEASE_CAPSULE | Freq: Every day | ORAL | 1 refills | Status: DC | PRN

## 2023-09-12 MED ORDER — LISINOPRIL 10 MG PO TABS: 10.0000 mg | ORAL_TABLET | Freq: Every day | ORAL | 1 refills | Status: DC

## 2023-09-12 NOTE — Assessment & Plan Note (Signed)
 Has been off her medicine for about a week to see if it helps her aches and pains. Will check in in about a month to see how she's feeling. Hold on refilling right now.

## 2023-09-12 NOTE — Progress Notes (Signed)
 BP 114/79 (BP Location: Left Arm, Patient Position: Sitting, Cuff Size: Normal)   Pulse 61   Ht 5\' 6"  (1.676 m)   Wt 163 lb 3.2 oz (74 kg)   SpO2 98%   BMI 26.34 kg/m    Subjective:    Patient ID: Makayla [name], female    DOB: 06-16-1950, 73 y.o.   MRN: [id number]  HPI: Makayla [name] is a 73 y.o. female presenting on 09/12/2023 for comprehensive medical examination. Current medical complaints include:  HYPERTENSION / HYPERLIPIDEMIA Satisfied with current treatment? yes Duration of hypertension: chronic BP monitoring frequency: not checking BP medication side effects: no Past BP meds: lisinopril  Duration of hyperlipidemia: chronic Cholesterol medication side effects: unsure- has been having aches and pain Cholesterol supplements: none Past cholesterol medications: atorvastatin  Medication compliance: good compliance Aspirin: no Recent stressors: no Recurrent headaches: no Visual changes: no Palpitations: no Dyspnea: no Chest pain: no Lower extremity edema: no Dizzy/lightheaded: no  Menopausal Symptoms: no  Depression Screen done today and results listed below:     09/12/2023    8:58 AM 04/25/2023    9:33 AM 04/02/2023   12:50 PM 02/12/2023    9:06 AM 12/05/2022   10:34 AM  Depression screen PHQ 2/9  Decreased Interest 0 0 0 0 0  Down, Depressed, Hopeless 0 0 0 0 0  PHQ - 2 Score 0 0 0 0 0  Altered sleeping 0 0 0 0 0  Tired, decreased energy 1 0 0 0 0  Change in appetite 0 0 0 0 0  Feeling bad or failure about yourself  0 0 0 0 0  Trouble concentrating 0 0 0 0 0  Moving slowly or fidgety/restless 0 0 0 0 0  Suicidal thoughts 0 0 0 0 0  PHQ-9 Score 1 0 0 0 0  Difficult doing work/chores Somewhat difficult Not difficult at all Not difficult at all Not difficult at all Not difficult at all    Past Medical History:  Past Medical History:  Diagnosis Date   DDD (degenerative disc disease), lumbar    Family history of adverse reaction to anesthesia     grandson age 48 stopped breathing stayed in hospital for a few days,no cause found grandson is now 58   GERD (gastroesophageal reflux disease)    Hypertension    Sciatica    right    Surgical History:  Past Surgical History:  Procedure Laterality Date   BLADDER SUSPENSION N/A 01/24/2022   Procedure: TRANSVAGINAL TAPE (TVT) PROCEDURE;  Surgeon: Arma Lamp, MD;  Location: [facility];  Service: Gynecology;  Laterality: N/A;   COLONOSCOPY     cologuard negative   CYSTOSCOPY N/A 03/24/2021   Procedure: CYSTOSCOPY;  Surgeon: Alben Alma, MD;  Location: [facility];  Service: Gynecology;  Laterality: N/A;   CYSTOSCOPY  2013   CYSTOSCOPY N/A 01/24/2022   Procedure: CYSTOSCOPY;  Surgeon: Arma Lamp, MD;  Location: [facility];  Service: Gynecology;  Laterality: N/A;   OOPHORECTOMY     OVARIAN CYST REMOVAL Left    age 42   ROBOTIC ASSISTED LAPAROSCOPIC SACROCOLPOPEXY N/A 01/24/2022   Procedure: XI ROBOTIC ASSISTED LAPAROSCOPIC SACROCOLPOPEXY;  Surgeon: Arma Lamp, MD;  Location: [facility];  Service: Gynecology;  Laterality: N/A;  total time requested is 3 hours   TOTAL LAPAROSCOPIC HYSTERECTOMY WITH BILATERAL SALPINGO OOPHORECTOMY Bilateral 03/24/2021   Procedure: TOTAL LAPAROSCOPIC HYSTERECTOMY WITH BILATERAL SALPINGO OOPHORECTOMY;  Surgeon: Alben Alma, MD;  Location: [facility];  Service: Gynecology;  Laterality: Bilateral;   TUBAL LIGATION     X 2    Medications:  Current Outpatient Medications on File Prior to Visit  Medication Sig   Multiple Vitamins-Minerals (MULTIVITAMIN WITH MINERALS) tablet Take 1 tablet by mouth daily.   OVER THE COUNTER MEDICATION Vitamin b 12 daily   atorvastatin  (LIPITOR) 20 MG tablet Take 1 tablet (20 mg total) by mouth daily. (Patient not taking: Reported on 09/12/2023)   No current facility-administered medications on file prior to visit.    Allergies:  Allergies   Allergen Reactions   Crestor  [Rosuvastatin  Calcium ] Other (See Comments)    Headache    Social History:  Social History   Socioeconomic History   Marital status: Married    Spouse name: Not on file   Number of children: Not on file   Years of education: Not on file   Highest education level: High school graduate  Occupational History   Occupation: retired  Tobacco Use   Smoking status: Former    Current packs/day: 0.00    Average packs/day: 0.5 packs/day for 15.0 years (7.5 ttl pk-yrs)    Types: Cigarettes    Start date: 03/24/1987    Quit date: 03/23/2002    Years since quitting: 21.4   Smokeless tobacco: Never  Vaping Use   Vaping status: Never Used  Substance and Sexual Activity   Alcohol use: No   Drug use: No   Sexual activity: Not Currently  Other Topics Concern   Not on file  Social History Narrative   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    Social Drivers of Health   Financial Resource Strain: Low Risk  (12/05/2022)   Overall Financial Resource Strain (CARDIA)    Difficulty of Paying Living Expenses: Not hard at all  Food Insecurity: No Food Insecurity (12/05/2022)   Hunger Vital Sign    Worried About Running Out of Food in the Last Year: Never true    Ran Out of Food in the Last Year: Never true  Transportation Needs: No Transportation Needs (12/05/2022)   PRAPARE - Administrator, Civil Service (Medical): No    Lack of Transportation (Non-Medical): No  Physical Activity: Sufficiently Active (12/05/2022)   Exercise Vital Sign    Days of Exercise per Week: 5 days    Minutes of Exercise per Session: 40 min  Stress: No Stress Concern Present (12/05/2022)   [employer] of Occupational Health - Occupational Stress Questionnaire    Feeling of Stress : Only a little  Social Connections: Moderately Integrated (12/05/2022)   Social Connection and Isolation Panel [NHANES]    Frequency of Communication with Friends and Family: More than three times a week     Frequency of Social Gatherings with Friends and Family: Twice a week    Attends Religious Services: More than 4 times per year    Active Member of [employer] or Organizations: No    Attends Banker Meetings: Never    Marital Status: Married  Catering manager Violence: Not At Risk (12/05/2022)   Humiliation, Afraid, Rape, and Kick questionnaire    Fear of Current or Ex-Partner: No    Emotionally Abused: No    Physically Abused: No    Sexually Abused: No   Social History   Tobacco Use  Smoking Status Former   Current packs/day: 0.00   Average packs/day: 0.5 packs/day for 15.0 years (7.5 ttl pk-yrs)   Types: Cigarettes   Start date: 03/24/1987  Quit date: 03/23/2002   Years since quitting: 21.4  Smokeless Tobacco Never   Social History   Substance and Sexual Activity  Alcohol Use No    Family History:  Family History  Problem Relation Age of Onset   Breast cancer Sister 46   Stroke Mother    Cancer Brother        Melanoma- went to liver   Melanoma Brother    Liver cancer Brother    Liver cancer Brother     Past medical history, surgical history, medications, allergies, family history and social history reviewed with patient today and changes made to appropriate areas of the chart.   Review of Systems  Constitutional: Negative.   HENT: Negative.    Eyes: Negative.   Respiratory: Negative.    Cardiovascular: Negative.   Gastrointestinal:  Positive for constipation. Negative for abdominal pain, blood in stool, diarrhea, heartburn, melena, nausea and vomiting.  Genitourinary: Negative.   Musculoskeletal:  Positive for back pain, joint pain, myalgias and neck pain. Negative for falls.  Skin: Negative.   Neurological: Negative.   Endo/Heme/Allergies:  Negative for environmental allergies and polydipsia. Bruises/bleeds easily.  Psychiatric/Behavioral: Negative.     All other ROS negative except what is listed above and in the HPI.      Objective:    BP  114/79 (BP Location: Left Arm, Patient Position: Sitting, Cuff Size: Normal)   Pulse 61   Ht 5\' 6"  (1.676 m)   Wt 163 lb 3.2 oz (74 kg)   SpO2 98%   BMI 26.34 kg/m   Wt Readings from Last 3 Encounters:  09/12/23 163 lb 3.2 oz (74 kg)  04/25/23 165 lb (74.8 kg)  04/02/23 161 lb 11.2 oz (73.3 kg)    Physical Exam Vitals and nursing note reviewed.  Constitutional:      General: She is not in acute distress.    Appearance: Normal appearance. She is not ill-appearing, toxic-appearing or diaphoretic.  HENT:     Head: Normocephalic and atraumatic.     Right Ear: Tympanic membrane, ear canal and external ear normal. There is no impacted cerumen.     Left Ear: Tympanic membrane, ear canal and external ear normal. There is no impacted cerumen.     Nose: Nose normal. No congestion or rhinorrhea.     Mouth/Throat:     Mouth: Mucous membranes are moist.     Pharynx: Oropharynx is clear. No oropharyngeal exudate or posterior oropharyngeal erythema.  Eyes:     General: No scleral icterus.       Right eye: No discharge.        Left eye: No discharge.     Extraocular Movements: Extraocular movements intact.     Conjunctiva/sclera: Conjunctivae normal.     Pupils: Pupils are equal, round, and reactive to light.  Neck:     Vascular: No carotid bruit.  Cardiovascular:     Rate and Rhythm: Normal rate and regular rhythm.     Pulses: Normal pulses.     Heart sounds: No murmur heard.    No friction rub. No gallop.  Pulmonary:     Effort: Pulmonary effort is normal. No respiratory distress.     Breath sounds: Normal breath sounds. No stridor. No wheezing, rhonchi or rales.  Chest:     Chest wall: No tenderness.  Abdominal:     General: Abdomen is flat. Bowel sounds are normal. There is no distension.     Palpations: Abdomen is soft. There is no  mass.     Tenderness: There is no abdominal tenderness. There is no right CVA tenderness, left CVA tenderness, guarding or rebound.     Hernia: No  hernia is present.  Genitourinary:    Comments: Breast and pelvic exams deferred with shared decision making Musculoskeletal:        General: No swelling, tenderness, deformity or signs of injury.     Cervical back: Normal range of motion and neck supple. No rigidity. No muscular tenderness.     Right lower leg: No edema.     Left lower leg: No edema.  Lymphadenopathy:     Cervical: No cervical adenopathy.  Skin:    General: Skin is warm and dry.     Capillary Refill: Capillary refill takes less than 2 seconds.     Coloration: Skin is not jaundiced or pale.     Findings: No bruising, erythema, lesion or rash.  Neurological:     General: No focal deficit present.     Mental Status: She is alert and oriented to person, place, and time. Mental status is at baseline.     Cranial Nerves: No cranial nerve deficit.     Sensory: No sensory deficit.     Motor: No weakness.     Coordination: Coordination normal.     Gait: Gait normal.     Deep Tendon Reflexes: Reflexes normal.  Psychiatric:        Mood and Affect: Mood normal.        Behavior: Behavior normal.        Thought Content: Thought content normal.        Judgment: Judgment normal.     Results for orders placed or performed in visit on 04/25/23  Urine Culture   Collection Time: 04/25/23  9:56 AM   Specimen: Urine   UR  Result Value Ref Range   Urine Culture, Routine Final report (A)    Organism ID, Bacteria Klebsiella pneumoniae (A)    ORGANISM ID, BACTERIA Comment    Antimicrobial Susceptibility Comment   WET PREP FOR TRICH, YEAST, CLUE   Collection Time: 04/25/23  9:56 AM   Specimen: Sterile Swab   Sterile Swab  Result Value Ref Range   Trichomonas Exam Negative Negative   Yeast Exam Negative Negative   Clue Cell Exam Negative Negative  Microscopic Examination   Collection Time: 04/25/23  9:56 AM   Urine  Result Value Ref Range   WBC, UA 0-5 0 - 5 /hpf   RBC, Urine 0-2 0 - 2 /hpf   Epithelial Cells (non renal)  0-10 0 - 10 /hpf   Bacteria, UA Many (A) None seen/Few  Urinalysis, Routine w reflex microscopic   Collection Time: 04/25/23  9:56 AM  Result Value Ref Range   Specific Gravity, UA 1.015 1.005 - 1.030   pH, UA 6.0 5.0 - 7.5   Color, UA Yellow Yellow   Appearance Ur Cloudy (A) Clear   Leukocytes,UA 1+ (A) Negative   Protein,UA Negative Negative/Trace   Glucose, UA Negative Negative   Ketones, UA Negative Negative   RBC, UA Trace (A) Negative   Bilirubin, UA Negative Negative   Urobilinogen, Ur 0.2 0.2 - 1.0 mg/dL   Nitrite, UA Positive (A) Negative   Microscopic Examination See below:       Assessment & Plan:   Problem List Items Addressed This Visit       Cardiovascular and Mediastinum   Senile purpura (HCC)   Reassured patient. Continue to  monitor.       Relevant Medications   lisinopril  (ZESTRIL ) 10 MG tablet   Other Relevant Orders   CBC with Differential/Platelet   Comprehensive metabolic panel with GFR     Genitourinary   Benign hypertensive renal disease   Under good control on current regimen. Continue current regimen. Continue to monitor. Call with any concerns. Refills given.  Labs drawn today.       Relevant Orders   Microalbumin, Urine Waived   CBC with Differential/Platelet   Comprehensive metabolic panel with GFR   TSH     Other   Hyperlipidemia   Has been off her medicine for about a week to see if it helps her aches and pains. Will check in in about a month to see how she's feeling. Hold on refilling right now.       Relevant Medications   lisinopril  (ZESTRIL ) 10 MG tablet   Other Relevant Orders   CBC with Differential/Platelet   Comprehensive metabolic panel with GFR   Lipid Panel w/o Chol/HDL Ratio   Other Visit Diagnoses       Routine general medical examination at a health care facility    -  Primary   Vaccines up to date. Screening labs checked today. DEXA, mammo and cologuard up to date. Continue diet and exercise. Call with any  concerns.        Follow up plan: Return in about 6 months (around 03/13/2024).   LABORATORY TESTING:  - Pap smear: not applicable  IMMUNIZATIONS:   - Tdap: Tetanus vaccination status reviewed: last tetanus booster within 10 years. - Influenza: Up to date - Pneumovax: Up to date - Prevnar: Up to date - COVID: Refused - HPV: Not applicable - Shingrix vaccine: Up to date  SCREENING: -Mammogram: Up to date  - Colonoscopy: Up to date  - Bone Density: Up to date   PATIENT COUNSELING:   Advised to take 1 mg of folate supplement per day if capable of pregnancy.   Sexuality: Discussed sexually transmitted diseases, partner selection, use of condoms, avoidance of unintended pregnancy  and contraceptive alternatives.   Advised to avoid cigarette smoking.  I discussed with the patient that most people either abstain from alcohol or drink within safe limits (<=14/week and <=4 drinks/occasion for males, <=7/weeks and <= 3 drinks/occasion for females) and that the risk for alcohol disorders and other health effects rises proportionally with the number of drinks per week and how often a drinker exceeds daily limits.  Discussed cessation/primary prevention of drug use and availability of treatment for abuse.   Diet: Encouraged to adjust caloric intake to maintain  or achieve ideal body weight, to reduce intake of dietary saturated fat and total fat, to limit sodium intake by avoiding high sodium foods and not adding table salt, and to maintain adequate dietary potassium and calcium  preferably from fresh fruits, vegetables, and low-fat dairy products.    stressed the importance of regular exercise  Injury prevention: Discussed safety belts, safety helmets, smoke detector, smoking near bedding or upholstery.   Dental health: Discussed importance of regular tooth brushing, flossing, and dental visits.    NEXT PREVENTATIVE PHYSICAL DUE IN 1 YEAR. Return in about 6 months (around  03/13/2024).

## 2023-09-12 NOTE — Assessment & Plan Note (Signed)
 Reassured patient. Continue to monitor.

## 2023-09-12 NOTE — Assessment & Plan Note (Signed)
 Under good control on current regimen. Continue current regimen. Continue to monitor. Call with any concerns. Refills given. Labs drawn today.

## 2023-09-13 ENCOUNTER — Encounter: Payer: Self-pay | Admitting: Family Medicine

## 2023-09-13 LAB — CBC WITH DIFFERENTIAL/PLATELET
Basophils Absolute: 0.1 10*3/uL (ref 0.0–0.2)
Basos: 1 %
EOS (ABSOLUTE): 0.1 10*3/uL (ref 0.0–0.4)
Eos: 2 %
Hematocrit: 39.5 % (ref 34.0–46.6)
Hemoglobin: 12.6 g/dL (ref 11.1–15.9)
Immature Grans (Abs): 0 10*3/uL (ref 0.0–0.1)
Immature Granulocytes: 0 %
Lymphocytes Absolute: 2.6 10*3/uL (ref 0.7–3.1)
Lymphs: 34 %
MCH: 31.7 pg (ref 26.6–33.0)
MCHC: 31.9 g/dL (ref 31.5–35.7)
MCV: 99 fL — ABNORMAL HIGH (ref 79–97)
Monocytes Absolute: 0.6 10*3/uL (ref 0.1–0.9)
Monocytes: 8 %
Neutrophils Absolute: 4.2 10*3/uL (ref 1.4–7.0)
Neutrophils: 55 %
Platelets: 238 10*3/uL (ref 150–450)
RBC: 3.98 x10E6/uL (ref 3.77–5.28)
RDW: 12.7 % (ref 11.7–15.4)
WBC: 7.5 10*3/uL (ref 3.4–10.8)

## 2023-09-13 LAB — COMPREHENSIVE METABOLIC PANEL WITH GFR
ALT: 15 IU/L (ref 0–32)
AST: 17 IU/L (ref 0–40)
Albumin: 4.1 g/dL (ref 3.8–4.8)
Alkaline Phosphatase: 71 IU/L (ref 44–121)
BUN/Creatinine Ratio: 33 — ABNORMAL HIGH (ref 12–28)
BUN: 26 mg/dL (ref 8–27)
Bilirubin Total: 0.2 mg/dL (ref 0.0–1.2)
CO2: 24 mmol/L (ref 20–29)
Calcium: 9.3 mg/dL (ref 8.7–10.3)
Chloride: 103 mmol/L (ref 96–106)
Creatinine, Ser: 0.8 mg/dL (ref 0.57–1.00)
Globulin, Total: 2 g/dL (ref 1.5–4.5)
Glucose: 91 mg/dL (ref 70–99)
Potassium: 4.4 mmol/L (ref 3.5–5.2)
Sodium: 141 mmol/L (ref 134–144)
Total Protein: 6.1 g/dL (ref 6.0–8.5)
eGFR: 79 mL/min/{1.73_m2} (ref >59)

## 2023-09-13 LAB — LIPID PANEL W/O CHOL/HDL RATIO
Cholesterol, Total: 250 mg/dL — ABNORMAL HIGH (ref 100–199)
HDL: 84 mg/dL (ref >39)
LDL Chol Calc (NIH): 145 mg/dL — ABNORMAL HIGH (ref 0–99)
Triglycerides: 121 mg/dL (ref 0–149)
VLDL Cholesterol Cal: 21 mg/dL (ref 5–40)

## 2023-09-13 LAB — TSH: TSH: 1.5 u[IU]/mL (ref 0.450–4.500)

## 2023-10-02 NOTE — Progress Notes (Signed)
 Flu shot given

## 2023-10-05 ENCOUNTER — Other Ambulatory Visit: Payer: Self-pay | Admitting: Family Medicine

## 2023-11-08 DIAGNOSIS — M5136 Other intervertebral disc degeneration, lumbar region with discogenic back pain only: Secondary | ICD-10-CM | POA: Diagnosis not present

## 2023-11-08 DIAGNOSIS — M7061 Trochanteric bursitis, right hip: Secondary | ICD-10-CM | POA: Diagnosis not present

## 2023-11-08 DIAGNOSIS — M25551 Pain in right hip: Secondary | ICD-10-CM | POA: Diagnosis not present

## 2023-12-06 DIAGNOSIS — M7061 Trochanteric bursitis, right hip: Secondary | ICD-10-CM | POA: Diagnosis not present

## 2023-12-11 ENCOUNTER — Ambulatory Visit (INDEPENDENT_AMBULATORY_CARE_PROVIDER_SITE_OTHER): Payer: Self-pay | Admitting: Emergency Medicine

## 2023-12-11 VITALS — Ht 66.5 in | Wt 161.0 lb

## 2023-12-11 DIAGNOSIS — Z0000 Encounter for general adult medical examination without abnormal findings: Secondary | ICD-10-CM

## 2023-12-11 NOTE — Progress Notes (Signed)
 Subjective:   Makayla [name] is a 73 y.o. who presents for a Medicare Wellness preventive visit.  As a reminder, Annual Wellness Visits don't include a physical exam, and some assessments may be limited, especially if this visit is performed virtually. We may recommend an in-person follow-up visit with your provider if needed.  Visit Complete: Virtual I connected with  Makayla [name] on 12/11/23 by a audio enabled telemedicine application and verified that I am speaking with the correct person using two identifiers.  Patient Location: Home  Provider Location: Home Office  I discussed the limitations of evaluation and management by telemedicine. The patient expressed understanding and agreed to proceed.  Vital Signs: Because this visit was a virtual/telehealth visit, some criteria may be missing or patient reported. Any vitals not documented were not able to be obtained and vitals that have been documented are patient reported.  VideoDeclined- This patient declined Librarian, academic. Therefore the visit was completed with audio only.  Persons Participating in Visit: Patient.  AWV Questionnaire: Yes: Patient Medicare AWV questionnaire was completed by the patient on 12/04/23; I have confirmed that all information answered by patient is correct and no changes since this date.  Cardiac Risk Factors include: advanced age (>60men, >51 women);dyslipidemia;hypertension     Objective:    Today's Vitals   12/11/23 1037 12/11/23 1038  Weight: 161 lb (73 kg)   Height: 5' 6.5 (1.689 m)   PainSc:  4    Body mass index is 25.6 kg/m.     12/11/2023   10:46 AM 12/05/2022   10:36 AM 01/24/2022    9:29 AM 11/29/2021   10:40 AM 03/24/2021    8:42 AM 03/14/2021    3:20 PM 11/19/2020   10:31 AM  Advanced Directives  Does Patient Have a Medical Advance Directive? Yes No Yes Yes Yes Yes No  Type of Clinical research associate  of [employer] Power of Sylvia;Living will    Does patient want to make changes to medical advance directive? No - Patient declined  No - Patient declined  No - Patient declined    Copy of Healthcare Power of Attorney in Chart? No - copy requested   No - copy requested No - copy requested    Would patient like information on creating a medical advance directive?  No - Patient declined   No - Patient declined      Current Medications (verified) Outpatient Encounter Medications as of 12/11/2023  Medication Sig   estradiol  (ESTRACE ) 0.1 MG/GM vaginal cream Place 0.5g (fingertip amount) nightly for two weeks then twice a week after.   lisinopril  (ZESTRIL ) 10 MG tablet Take 1 tablet (10 mg total) by mouth daily.   Multiple Vitamins-Minerals (MULTIVITAMIN WITH MINERALS) tablet Take 1 tablet by mouth daily.   omeprazole  (PRILOSEC) 20 MG capsule Take 1 capsule (20 mg total) by mouth daily as needed. TAKE 1 CAPSULE BY MOUTH EVERY DAY   OVER THE COUNTER MEDICATION Vitamin b 12 daily   No facility-administered encounter medications on file as of 12/11/2023.    Allergies (verified) Atorvastatin  and Crestor  [rosuvastatin  calcium ]   History: Past Medical History:  Diagnosis Date   DDD (degenerative disc disease), lumbar    Family history of adverse reaction to anesthesia    grandson age 66 stopped breathing stayed in hospital for a few days,no cause found grandson is now 20   GERD (gastroesophageal reflux disease)    Hypertension  Sciatica    right   Past Surgical History:  Procedure Laterality Date   BLADDER SUSPENSION N/A 01/24/2022   Procedure: TRANSVAGINAL TAPE (TVT) PROCEDURE;  Surgeon: Marilynne Rosaline SAILOR, MD;  Location: [facility];  Service: Gynecology;  Laterality: N/A;   COLONOSCOPY     cologuard negative   CYSTOSCOPY N/A 03/24/2021   Procedure: CYSTOSCOPY;  Surgeon: Arloa Lamar SQUIBB, MD;  Location: [facility];  Service: Gynecology;  Laterality: N/A;    CYSTOSCOPY  2013   CYSTOSCOPY N/A 01/24/2022   Procedure: CYSTOSCOPY;  Surgeon: Marilynne Rosaline SAILOR, MD;  Location: [facility];  Service: Gynecology;  Laterality: N/A;   OOPHORECTOMY     OVARIAN CYST REMOVAL Left    age 85   ROBOTIC ASSISTED LAPAROSCOPIC SACROCOLPOPEXY N/A 01/24/2022   Procedure: XI ROBOTIC ASSISTED LAPAROSCOPIC SACROCOLPOPEXY;  Surgeon: Marilynne Rosaline SAILOR, MD;  Location: [facility];  Service: Gynecology;  Laterality: N/A;  total time requested is 3 hours   TOTAL LAPAROSCOPIC HYSTERECTOMY WITH BILATERAL SALPINGO OOPHORECTOMY Bilateral 03/24/2021   Procedure: TOTAL LAPAROSCOPIC HYSTERECTOMY WITH BILATERAL SALPINGO OOPHORECTOMY;  Surgeon: Arloa Lamar SQUIBB, MD;  Location: [facility];  Service: Gynecology;  Laterality: Bilateral;   TUBAL LIGATION     X 2   Family History  Problem Relation Age of Onset   Stroke Mother    Pneumonia Father        passed while in nursing home   Breast cancer Sister 80   Cancer Brother        Melanoma- went to liver   Melanoma Brother    Liver cancer Brother    Liver cancer Brother    Social History   Socioeconomic History   Marital status: Married    Spouse name: Dalton   Number of children: 4   Years of education: Not on file   Highest education level: Associate degree: occupational, Scientist, product/process development, or vocational program  Occupational History   Occupation: retired  Tobacco Use   Smoking status: Former    Current packs/day: 0.00    Average packs/day: 0.5 packs/day for 15.0 years (7.5 ttl pk-yrs)    Types: Cigarettes    Start date: 03/24/1987    Quit date: 03/23/2002    Years since quitting: 21.7   Smokeless tobacco: Never  Vaping Use   Vaping status: Never Used  Substance and Sexual Activity   Alcohol use: No   Drug use: No   Sexual activity: Not Currently  Other Topics Concern   Not on file  Social History Narrative   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    Social Drivers of Health   Financial Resource  Strain: Low Risk  (12/11/2023)   Overall Financial Resource Strain (CARDIA)    Difficulty of Paying Living Expenses: Not hard at all  Food Insecurity: No Food Insecurity (12/11/2023)   Hunger Vital Sign    Worried About Running Out of Food in the Last Year: Never true    Ran Out of Food in the Last Year: Never true  Transportation Needs: No Transportation Needs (12/11/2023)   PRAPARE - Administrator, Civil Service (Medical): No    Lack of Transportation (Non-Medical): No  Physical Activity: Insufficiently Active (12/11/2023)   Exercise Vital Sign    Days of Exercise per Week: 3 days    Minutes of Exercise per Session: 20 min  Stress: No Stress Concern Present (12/11/2023)   [employer] of Occupational Health - Occupational Stress Questionnaire    Feeling of  Stress: Only a little  Social Connections: Socially Integrated (12/11/2023)   Social Connection and Isolation Panel    Frequency of Communication with Friends and Family: More than three times a week    Frequency of Social Gatherings with Friends and Family: Twice a week    Attends Religious Services: More than 4 times per year    Active Member of [employer] or Organizations: Yes    Attends Engineer, structural: More than 4 times per year    Marital Status: Married    Tobacco Counseling Counseling given: Not Answered    Clinical Intake:  Pre-visit preparation completed: Yes  Pain : 0-10 Pain Score: 4  Pain Type: Chronic pain Pain Location: Hip Pain Orientation: Right Pain Descriptors / Indicators: Aching     BMI - recorded: 25.6 Nutritional Status: BMI 25 -29 Overweight Nutritional Risks: None Diabetes: No  Lab Results  Component Value Date   HGBA1C 5.3 06/19/2017     How often do you need to have someone help you when you read instructions, pamphlets, or other written materials from your doctor or pharmacy?: 1 - Never  Interpreter Needed?: No  Information entered by :: Vina Ned,  CMA   Activities of Daily Living     12/11/2023   10:39 AM 12/04/2023    9:13 AM  In your present state of health, do you have any difficulty performing the following activities:  Hearing? 0 0  Vision? 0 0  Difficulty concentrating or making decisions? 0 0  Walking or climbing stairs? 0 0  Dressing or bathing? 0 0  Doing errands, shopping? 0 0  Preparing Food and eating ? N N  Using the Toilet? N N  In the past six months, have you accidently leaked urine? Y Y  Comment wears panty liner   Do you have problems with loss of bowel control? N N  Managing your Medications? N N  Managing your Finances? N N  Housekeeping or managing your Housekeeping? N N    Patient Care Team: Vicci Duwaine SQUIBB, DO as PCP - General (Family Medicine) Dimmig, Ned, MD as Referring Physician (Orthopedic Surgery) Mevelyn Charleston, OD as Referring Physician (Optometry) Avanell Katz, MD as Referring Physician (Physical Medicine and Rehabilitation) Drake Chew, PA-C as Physician Assistant (Orthopedic Surgery)  I have updated your Care Teams any recent Medical Services you may have received from other providers in the past year.     Assessment:   This is a routine wellness examination for Makayla [name].  Hearing/Vision screen Hearing Screening - Comments:: Denies hearing loss  Vision Screening - Comments:: Gets routine eye exams, Dr Charleston Mevelyn, Arlyss Malta   Goals Addressed             This Visit's Progress    Patient Stated       Lose 10 lbs       Depression Screen     12/11/2023   10:44 AM 09/12/2023    8:58 AM 04/25/2023    9:33 AM 04/02/2023   12:50 PM 02/12/2023    9:06 AM 12/05/2022   10:34 AM 02/01/2022    9:40 AM  PHQ 2/9 Scores  PHQ - 2 Score 0 0 0 0 0 0 0  PHQ- 9 Score 1 1 0 0 0 0 0    Fall Risk     12/11/2023   10:47 AM 12/04/2023    9:13 AM 09/12/2023    8:58 AM 04/02/2023   12:50 PM 02/12/2023    9:05  AM  Fall Risk   Falls in the past year? 0 0 0 0 0  Number  falls in past yr: 0 0 0 0 0  Injury with Fall? 0 0 0 0 0  Risk for fall due to : No Fall Risks   No Fall Risks No Fall Risks  Follow up Falls evaluation completed   Falls prevention discussed;Education provided;Falls evaluation completed Falls evaluation completed    MEDICARE RISK AT HOME:  Medicare Risk at Home Any stairs in or around the home?: Yes If so, are there any without handrails?: No Home free of loose throw rugs in walkways, pet beds, electrical cords, etc?: Yes Adequate lighting in your home to reduce risk of falls?: Yes Life alert?: No Use of a cane, walker or w/c?: No Grab bars in the bathroom?: Yes Shower chair or bench in shower?: No Elevated toilet seat or a handicapped toilet?: No  TIMED UP AND GO:  Was the test performed?  No  Cognitive Function: 6CIT completed        12/11/2023   10:48 AM 09/12/2023    8:58 AM 12/05/2022   10:38 AM 11/29/2021   10:39 AM 11/19/2020   10:33 AM  6CIT Screen  What Year? 0 points 0 points 0 points 0 points 0 points  What month? 0 points 0 points 0 points 0 points 0 points  What time? 0 points 0 points 0 points 0 points 0 points  Count back from 20 0 points 0 points 0 points 0 points 0 points  Months in reverse 0 points 0 points 0 points 0 points 0 points  Repeat phrase 0 points 0 points 0 points 0 points 2 points  Total Score 0 points 0 points 0 points 0 points 2 points    Immunizations Immunization History  Administered Date(s) Administered   Fluad Quad(high Dose 65+) 02/23/2021, 03/06/2022   Fluad Trivalent(High Dose 65+) 06/13/2023   Fluzone Influenza virus vaccine,trivalent (IIV3), split virus 01/08/2015   Influenza, High Dose Seasonal PF 03/15/2018   Influenza,inj,Quad PF,6+ Mos 02/18/2019   Influenza-Unspecified 03/05/2017   Janssen (J&J) SARS-COV-2 Vaccination 07/25/2019   Pneumococcal Conjugate-13 06/19/2017   Pneumococcal Polysaccharide-23 05/15/2012, 07/03/2018   Td 09/01/2020   Tetanus 10/29/2005   Zoster  Recombinant(Shingrix) 01/08/2015   Zoster, Live 01/08/2015, 01/25/2015    Screening Tests Health Maintenance  Topic Date Due   INFLUENZA VACCINE  12/14/2023   MAMMOGRAM  08/22/2024   Fecal DNA (Cologuard)  09/09/2024   Medicare Annual Wellness (AWV)  12/10/2024   DEXA SCAN  05/10/2025   DTaP/Tdap/Td (2 - Tdap) 09/02/2030   Pneumococcal Vaccine: 50+ Years  Completed   Hepatitis C Screening  Completed   Hepatitis B Vaccines  Aged Out   HPV VACCINES  Aged Out   Meningococcal B Vaccine  Aged Out   Colonoscopy  Discontinued   COVID-19 Vaccine  Discontinued   Zoster Vaccines- Shingrix  Discontinued    Health Maintenance  There are no preventive care reminders to display for this patient.  Health Maintenance Items Addressed: See Nurse Notes at the end of this note  Additional Screening:  Vision Screening: Recommended annual ophthalmology exams for early detection of glaucoma and other disorders of the eye. Would you like a referral to an eye doctor? No    Dental Screening: Recommended annual dental exams for proper oral hygiene  Community Resource Referral / Chronic Care Management: CRR required this visit?  No   CCM required this visit?  No  Plan:    I have personally reviewed and noted the following in the patient's chart:   Medical and social history Use of alcohol, tobacco or illicit drugs  Current medications and supplements including opioid prescriptions. Patient is not currently taking opioid prescriptions. Functional ability and status Nutritional status Physical activity Advanced directives List of other physicians Hospitalizations, surgeries, and ER visits in previous 12 months Vitals Screenings to include cognitive, depression, and falls Referrals and appointments  In addition, I have reviewed and discussed with patient certain preventive protocols, quality metrics, and best practice recommendations. A written personalized care plan for preventive  services as well as general preventive health recommendations were provided to patient.   Vina Ned, CMA   12/11/2023   After Visit Summary: (MyChart) Due to this being a telephonic visit, the after visit summary with patients personalized plan was offered to patient via MyChart   Notes:  Shingrix vaccine UTD per patient

## 2023-12-11 NOTE — Patient Instructions (Signed)
 Makayla [name] , Thank you for taking time out of your busy schedule to complete your Annual Wellness Visit with me. I enjoyed our conversation and look forward to speaking with you again next year. I, as well as your care team,  appreciate your ongoing commitment to your health goals. Please review the following plan we discussed and let me know if I can assist you in the future. Your Game plan/ To Do List    Referrals: None   Follow up Visits: Next Medicare AWV with our clinical staff: 12/16/24 @ 10:40am (VIDEO VISIT)   Have you seen your provider in the last 6 months (3 months if uncontrolled diabetes)? Yes Next Office Visit with your provider: 03/13/24 @ 2:00pm with Dr. Vicci  Clinician Recommendations: Flu vaccine in the fall. Keep up the good work! Aim for 30 minutes of exercise or brisk walking, 6-8 glasses of water , and 5 servings of fruits and vegetables each day.       This is a list of the screening recommended for you and due dates:  Health Maintenance  Topic Date Due   Flu Shot  12/14/2023   Mammogram  08/22/2024   Cologuard (Stool DNA test)  09/09/2024   Medicare Annual Wellness Visit  12/10/2024   DEXA scan (bone density measurement)  05/10/2025   DTaP/Tdap/Td vaccine (2 - Tdap) 09/02/2030   Pneumococcal Vaccine for age over 23  Completed   Hepatitis C Screening  Completed   Hepatitis B Vaccine  Aged Out   HPV Vaccine  Aged Out   Meningitis B Vaccine  Aged Out   Colon Cancer Screening  Discontinued   COVID-19 Vaccine  Discontinued   Zoster (Shingles) Vaccine  Discontinued    Advanced directives: (Copy Requested) Please bring a copy of your health care power of attorney and living will to the office to be added to your chart at your convenience. You can mail to [facility] [address] [place], [place] [address] or email to [email or web address] Advance Care Planning is important because it:  [x]  Makes sure you receive the medical care that is  consistent with your values, goals, and preferences  [x]  It provides guidance to your family and loved ones and reduces their decisional burden about whether or not they are making the right decisions based on your wishes.  Follow the link provided in your after visit summary or read over the paperwork we have mailed to you to help you started getting your Advance Directives in place. If you need assistance in completing these, please reach out to us  so that we can help you!  See attachments for Preventive Care and Fall Prevention Tips.   Fall Prevention in the Home, Adult Falls can cause injuries and affect people of all ages. There are many simple things that you can do to make your home safe and to help prevent falls. If you need it, ask for help making these changes. What actions can I take to prevent falls? General information Use good lighting in all rooms. Make sure to: Replace any light bulbs that burn out. Turn on lights if it is dark and use night-lights. Keep items that you use often in easy-to-reach places. Lower the shelves around your home if needed. Move furniture so that there are clear paths around it. Do not keep throw rugs or other things on the floor that can make you trip. If any of your floors are uneven, fix them. Add color or contrast paint  or tape to clearly mark and help you see: Grab bars or handrails. First and last steps of staircases. Where the edge of each step is. If you use a ladder or stepladder: Make sure that it is fully opened. Do not climb a closed ladder. Make sure the sides of the ladder are locked in place. Have someone hold the ladder while you use it. Know where your pets are as you move through your home. What can I do in the bathroom?     Keep the floor dry. Clean up any water  that is on the floor right away. Remove soap buildup in the bathtub or shower. Buildup makes bathtubs and showers slippery. Use non-skid mats or decals on the  floor of the bathtub or shower. Attach bath mats securely with double-sided, non-slip rug tape. If you need to sit down while you are in the shower, use a non-slip stool. Install grab bars by the toilet and in the bathtub and shower. Do not use towel bars as grab bars. What can I do in the bedroom? Make sure that you have a light by your bed that is easy to reach. Do not use any sheets or blankets on your bed that hang to the floor. Have a firm bench or chair with side arms that you can use for support when you get dressed. What can I do in the kitchen? Clean up any spills right away. If you need to reach something above you, use a sturdy step stool that has a grab bar. Keep electrical cables out of the way. Do not use floor polish or wax that makes floors slippery. What can I do with my stairs? Do not leave anything on the stairs. Make sure that you have a light switch at the top and the bottom of the stairs. Have them installed if you do not have them. Make sure that there are handrails on both sides of the stairs. Fix handrails that are broken or loose. Make sure that handrails are as long as the staircases. Install non-slip stair treads on all stairs in your home if they do not have carpet. Avoid having throw rugs at the top or bottom of stairs, or secure the rugs with carpet tape to prevent them from moving. Choose a carpet design that does not hide the edge of steps on the stairs. Make sure that carpet is firmly attached to the stairs. Fix any carpet that is loose or worn. What can I do on the outside of my home? Use bright outdoor lighting. Repair the edges of walkways and driveways and fix any cracks. Clear paths of anything that can make you trip, such as tools or rocks. Add color or contrast paint or tape to clearly mark and help you see high doorway thresholds. Trim any bushes or trees on the main path into your home. Check that handrails are securely fastened and in good repair.  Both sides of all steps should have handrails. Install guardrails along the edges of any raised decks or porches. Have leaves, snow, and ice cleared regularly. Use sand, salt, or ice melt on walkways during winter months if you live where there is ice and snow. In the garage, clean up any spills right away, including grease or oil spills. What other actions can I take? Review your medicines with your health care provider. Some medicines can make you confused or feel dizzy. This can increase your chance of falling. Wear closed-toe shoes that fit well and support your  feet. Wear shoes that have rubber soles and low heels. Use a cane, walker, scooter, or crutches that help you move around if needed. Talk with your provider about other ways that you can decrease your risk of falls. This may include seeing a physical therapist to learn to do exercises to improve movement and strength. Where to find more information Centers for Disease Control and Prevention, STEADI: TonerPromos.no [employer] on Aging: BaseRingTones.pl National Institute on Aging: BaseRingTones.pl Contact a health care provider if: You are afraid of falling at home. You feel weak, drowsy, or dizzy at home. You fall at home. Get help right away if you: Lose consciousness or have trouble moving after a fall. Have a fall that causes a head injury. These symptoms may be an emergency. Get help right away. Call 911. Do not wait to see if the symptoms will go away. Do not drive yourself to the hospital. This information is not intended to replace advice given to you by your health care provider. Make sure you discuss any questions you have with your health care provider. Document Revised: 01/02/2022 Document Reviewed: 01/02/2022 Elsevier Patient Education  2024 [employer].

## 2023-12-17 DIAGNOSIS — M5416 Radiculopathy, lumbar region: Secondary | ICD-10-CM | POA: Diagnosis not present

## 2023-12-17 DIAGNOSIS — M9903 Segmental and somatic dysfunction of lumbar region: Secondary | ICD-10-CM | POA: Diagnosis not present

## 2023-12-17 DIAGNOSIS — M62830 Muscle spasm of back: Secondary | ICD-10-CM | POA: Diagnosis not present

## 2023-12-17 DIAGNOSIS — M9904 Segmental and somatic dysfunction of sacral region: Secondary | ICD-10-CM | POA: Diagnosis not present

## 2023-12-19 DIAGNOSIS — M9904 Segmental and somatic dysfunction of sacral region: Secondary | ICD-10-CM | POA: Diagnosis not present

## 2023-12-19 DIAGNOSIS — M9903 Segmental and somatic dysfunction of lumbar region: Secondary | ICD-10-CM | POA: Diagnosis not present

## 2023-12-19 DIAGNOSIS — M62830 Muscle spasm of back: Secondary | ICD-10-CM | POA: Diagnosis not present

## 2023-12-19 DIAGNOSIS — M5416 Radiculopathy, lumbar region: Secondary | ICD-10-CM | POA: Diagnosis not present

## 2023-12-21 DIAGNOSIS — M9903 Segmental and somatic dysfunction of lumbar region: Secondary | ICD-10-CM | POA: Diagnosis not present

## 2023-12-21 DIAGNOSIS — M62830 Muscle spasm of back: Secondary | ICD-10-CM | POA: Diagnosis not present

## 2023-12-21 DIAGNOSIS — M9904 Segmental and somatic dysfunction of sacral region: Secondary | ICD-10-CM | POA: Diagnosis not present

## 2023-12-21 DIAGNOSIS — M5416 Radiculopathy, lumbar region: Secondary | ICD-10-CM | POA: Diagnosis not present

## 2023-12-25 DIAGNOSIS — M9903 Segmental and somatic dysfunction of lumbar region: Secondary | ICD-10-CM | POA: Diagnosis not present

## 2023-12-25 DIAGNOSIS — M5416 Radiculopathy, lumbar region: Secondary | ICD-10-CM | POA: Diagnosis not present

## 2023-12-25 DIAGNOSIS — M9904 Segmental and somatic dysfunction of sacral region: Secondary | ICD-10-CM | POA: Diagnosis not present

## 2023-12-25 DIAGNOSIS — M62830 Muscle spasm of back: Secondary | ICD-10-CM | POA: Diagnosis not present

## 2023-12-28 DIAGNOSIS — M9903 Segmental and somatic dysfunction of lumbar region: Secondary | ICD-10-CM | POA: Diagnosis not present

## 2023-12-28 DIAGNOSIS — M9904 Segmental and somatic dysfunction of sacral region: Secondary | ICD-10-CM | POA: Diagnosis not present

## 2023-12-28 DIAGNOSIS — M5416 Radiculopathy, lumbar region: Secondary | ICD-10-CM | POA: Diagnosis not present

## 2023-12-28 DIAGNOSIS — M62830 Muscle spasm of back: Secondary | ICD-10-CM | POA: Diagnosis not present

## 2024-01-04 DIAGNOSIS — M5416 Radiculopathy, lumbar region: Secondary | ICD-10-CM | POA: Diagnosis not present

## 2024-01-04 DIAGNOSIS — M9904 Segmental and somatic dysfunction of sacral region: Secondary | ICD-10-CM | POA: Diagnosis not present

## 2024-01-04 DIAGNOSIS — M9903 Segmental and somatic dysfunction of lumbar region: Secondary | ICD-10-CM | POA: Diagnosis not present

## 2024-01-04 DIAGNOSIS — M62830 Muscle spasm of back: Secondary | ICD-10-CM | POA: Diagnosis not present

## 2024-01-11 DIAGNOSIS — M5416 Radiculopathy, lumbar region: Secondary | ICD-10-CM | POA: Diagnosis not present

## 2024-01-11 DIAGNOSIS — M9904 Segmental and somatic dysfunction of sacral region: Secondary | ICD-10-CM | POA: Diagnosis not present

## 2024-01-11 DIAGNOSIS — M62830 Muscle spasm of back: Secondary | ICD-10-CM | POA: Diagnosis not present

## 2024-01-11 DIAGNOSIS — M9903 Segmental and somatic dysfunction of lumbar region: Secondary | ICD-10-CM | POA: Diagnosis not present

## 2024-01-16 DIAGNOSIS — M5416 Radiculopathy, lumbar region: Secondary | ICD-10-CM | POA: Diagnosis not present

## 2024-01-16 DIAGNOSIS — M62830 Muscle spasm of back: Secondary | ICD-10-CM | POA: Diagnosis not present

## 2024-01-16 DIAGNOSIS — M9904 Segmental and somatic dysfunction of sacral region: Secondary | ICD-10-CM | POA: Diagnosis not present

## 2024-01-16 DIAGNOSIS — M9903 Segmental and somatic dysfunction of lumbar region: Secondary | ICD-10-CM | POA: Diagnosis not present

## 2024-03-13 ENCOUNTER — Encounter: Payer: Self-pay | Admitting: Family Medicine

## 2024-03-13 ENCOUNTER — Ambulatory Visit (INDEPENDENT_AMBULATORY_CARE_PROVIDER_SITE_OTHER): Admitting: Family Medicine

## 2024-03-13 VITALS — BP 138/88 | HR 65 | Ht 66.5 in | Wt 168.4 lb

## 2024-03-13 DIAGNOSIS — Z23 Encounter for immunization: Secondary | ICD-10-CM

## 2024-03-13 DIAGNOSIS — E782 Mixed hyperlipidemia: Secondary | ICD-10-CM

## 2024-03-13 DIAGNOSIS — I129 Hypertensive chronic kidney disease with stage 1 through stage 4 chronic kidney disease, or unspecified chronic kidney disease: Secondary | ICD-10-CM

## 2024-03-13 MED ORDER — LISINOPRIL 10 MG PO TABS: 10.0000 mg | ORAL_TABLET | Freq: Every day | ORAL | 1 refills | Status: AC

## 2024-03-13 MED ORDER — MELOXICAM 15 MG PO TABS: 15.0000 mg | ORAL_TABLET | Freq: Every day | ORAL | 1 refills | Status: AC

## 2024-03-13 MED ORDER — OMEPRAZOLE 20 MG PO CPDR: 20.0000 mg | DELAYED_RELEASE_CAPSULE | Freq: Every day | ORAL | 1 refills | Status: AC | PRN

## 2024-03-13 MED ORDER — ESTRADIOL 0.01 % VA CREA: 1.0000 | TOPICAL_CREAM | Freq: Every day | VAGINAL | 12 refills | Status: AC

## 2024-03-13 NOTE — Progress Notes (Signed)
 BP 138/88   Pulse 65   Ht 5' 6.5 (1.689 m)   Wt 168 lb 6.4 oz (76.4 kg)   SpO2 95%   BMI 26.77 kg/m    Subjective:    Patient ID: Makayla [name], female    DOB: 1950/08/26, 73 y.o.   MRN: [id number]  HPI: Makayla [name] is a 73 y.o. female  Chief Complaint  Patient presents with   Hypertension   HYPERTENSION / HYPERLIPIDEMIA Satisfied with current treatment? yes Duration of hypertension: chronic BP monitoring frequency: not checking BP medication side effects: no Past BP meds: lisinopril  Duration of hyperlipidemia: chronic Cholesterol medication side effects: N/A Cholesterol supplements: none Past cholesterol medications: none Medication compliance: excellent Aspirin: no Recent stressors: no Recurrent headaches: no Visual changes: no Palpitations: no Dyspnea: no Chest pain: no Lower extremity edema: no Dizzy/lightheaded: no  Relevant past medical, surgical, family and social history reviewed and updated as indicated. Interim medical history since our last visit reviewed. Allergies and medications reviewed and updated.  Review of Systems  Constitutional: Negative.   Respiratory: Negative.    Cardiovascular: Negative.   Musculoskeletal: Negative.   Neurological: Negative.   Psychiatric/Behavioral: Negative.      Per HPI unless specifically indicated above     Objective:    BP 138/88   Pulse 65   Ht 5' 6.5 (1.689 m)   Wt 168 lb 6.4 oz (76.4 kg)   SpO2 95%   BMI 26.77 kg/m   Wt Readings from Last 3 Encounters:  03/13/24 168 lb 6.4 oz (76.4 kg)  12/11/23 161 lb (73 kg)  09/12/23 163 lb 3.2 oz (74 kg)    Physical Exam Vitals and nursing note reviewed.  Constitutional:      General: She is not in acute distress.    Appearance: Normal appearance. She is not ill-appearing, toxic-appearing or diaphoretic.  HENT:     Head: Normocephalic and atraumatic.     Right Ear: External ear normal.     Left Ear: External ear normal.     Nose: Nose  normal.     Mouth/Throat:     Mouth: Mucous membranes are moist.     Pharynx: Oropharynx is clear.  Eyes:     General: No scleral icterus.       Right eye: No discharge.        Left eye: No discharge.     Extraocular Movements: Extraocular movements intact.     Conjunctiva/sclera: Conjunctivae normal.     Pupils: Pupils are equal, round, and reactive to light.  Cardiovascular:     Rate and Rhythm: Normal rate and regular rhythm.     Pulses: Normal pulses.     Heart sounds: Normal heart sounds. No murmur heard.    No friction rub. No gallop.  Pulmonary:     Effort: Pulmonary effort is normal. No respiratory distress.     Breath sounds: Normal breath sounds. No stridor. No wheezing, rhonchi or rales.  Chest:     Chest wall: No tenderness.  Musculoskeletal:        General: Normal range of motion.     Cervical back: Normal range of motion and neck supple.  Skin:    General: Skin is warm and dry.     Capillary Refill: Capillary refill takes less than 2 seconds.     Coloration: Skin is not jaundiced or pale.     Findings: No bruising, erythema, lesion or rash.  Neurological:     General: No  focal deficit present.     Mental Status: She is alert and oriented to person, place, and time. Mental status is at baseline.  Psychiatric:        Mood and Affect: Mood normal.        Behavior: Behavior normal.        Thought Content: Thought content normal.        Judgment: Judgment normal.     Results for orders placed or performed in visit on 09/12/23  Microalbumin, Urine Waived   Collection Time: 09/12/23  9:21 AM  Result Value Ref Range   Microalb, Ur Waived 30 (H) 0 - 19 mg/L   Creatinine, Urine Waived 50 10 - 300 mg/dL   Microalb/Creat Ratio <30 <30 mg/g  CBC with Differential/Platelet   Collection Time: 09/12/23  9:22 AM  Result Value Ref Range   WBC 7.5 3.4 - 10.8 x10E3/uL   RBC 3.98 3.77 - 5.28 x10E6/uL   Hemoglobin 12.6 11.1 - 15.9 g/dL   Hematocrit 60.4 65.9 - 46.6 %    MCV 99 (H) 79 - 97 fL   MCH 31.7 26.6 - 33.0 pg   MCHC 31.9 31.5 - 35.7 g/dL   RDW 87.2 88.2 - 84.5 %   Platelets 238 150 - 450 x10E3/uL   Neutrophils 55 Not Estab. %   Lymphs 34 Not Estab. %   Monocytes 8 Not Estab. %   Eos 2 Not Estab. %   Basos 1 Not Estab. %   Neutrophils Absolute 4.2 1.4 - 7.0 x10E3/uL   Lymphocytes Absolute 2.6 0.7 - 3.1 x10E3/uL   Monocytes Absolute 0.6 0.1 - 0.9 x10E3/uL   EOS (ABSOLUTE) 0.1 0.0 - 0.4 x10E3/uL   Basophils Absolute 0.1 0.0 - 0.2 x10E3/uL   Immature Granulocytes 0 Not Estab. %   Immature Grans (Abs) 0.0 0.0 - 0.1 x10E3/uL  Comprehensive metabolic panel with GFR   Collection Time: 09/12/23  9:22 AM  Result Value Ref Range   Glucose 91 70 - 99 mg/dL   BUN 26 8 - 27 mg/dL   Creatinine, Ser 9.19 0.57 - 1.00 mg/dL   eGFR 79 >40 fO/fpw/8.26   BUN/Creatinine Ratio 33 (H) 12 - 28   Sodium 141 134 - 144 mmol/L   Potassium 4.4 3.5 - 5.2 mmol/L   Chloride 103 96 - 106 mmol/L   CO2 24 20 - 29 mmol/L   Calcium  9.3 8.7 - 10.3 mg/dL   Total Protein 6.1 6.0 - 8.5 g/dL   Albumin 4.1 3.8 - 4.8 g/dL   Globulin, Total 2.0 1.5 - 4.5 g/dL   Bilirubin Total 0.2 0.0 - 1.2 mg/dL   Alkaline Phosphatase 71 44 - 121 IU/L   AST 17 0 - 40 IU/L   ALT 15 0 - 32 IU/L  Lipid Panel w/o Chol/HDL Ratio   Collection Time: 09/12/23  9:22 AM  Result Value Ref Range   Cholesterol, Total 250 (H) 100 - 199 mg/dL   Triglycerides 878 0 - 149 mg/dL   HDL 84 >60 mg/dL   VLDL Cholesterol Cal 21 5 - 40 mg/dL   LDL Chol Calc (NIH) 854 (H) 0 - 99 mg/dL  TSH   Collection Time: 09/12/23  9:22 AM  Result Value Ref Range   TSH 1.500 0.450 - 4.500 uIU/mL      Assessment & Plan:   Problem List Items Addressed This Visit       Genitourinary   Benign hypertensive renal disease - Primary   Under good  control on current regimen. Continue current regimen. Continue to monitor. Call with any concerns. Refills given. Labs drawn today.        Relevant Orders   Comprehensive  metabolic panel with GFR   CBC with Differential/Platelet     Other   Hyperlipidemia   Rechecking labs today. Await results. Treat as needed.       Relevant Medications   lisinopril  (ZESTRIL ) 10 MG tablet   Other Relevant Orders   Comprehensive metabolic panel with GFR   CBC with Differential/Platelet   Lipid Panel w/o Chol/HDL Ratio   Other Visit Diagnoses       Needs flu shot       Flu shot given today.   Relevant Orders   Flu vaccine HIGH DOSE PF(Fluzone Trivalent)        Follow up plan: Return in about 6 months (around 09/11/2024) for physical.

## 2024-03-13 NOTE — Assessment & Plan Note (Signed)
 Rechecking labs today. Await results. Treat as needed.

## 2024-03-13 NOTE — Assessment & Plan Note (Signed)
 Under good control on current regimen. Continue current regimen. Continue to monitor. Call with any concerns. Refills given. Labs drawn today.

## 2024-03-14 ENCOUNTER — Ambulatory Visit: Payer: Self-pay | Admitting: Family Medicine

## 2024-03-14 LAB — CBC WITH DIFFERENTIAL/PLATELET
Basophils Absolute: 0.1 x10E3/uL (ref 0.0–0.2)
Basos: 1 %
EOS (ABSOLUTE): 0.2 x10E3/uL (ref 0.0–0.4)
Eos: 2 %
Hematocrit: 38.6 % (ref 34.0–46.6)
Hemoglobin: 12.3 g/dL (ref 11.1–15.9)
Immature Grans (Abs): 0 x10E3/uL (ref 0.0–0.1)
Immature Granulocytes: 0 %
Lymphocytes Absolute: 2.4 x10E3/uL (ref 0.7–3.1)
Lymphs: 31 %
MCH: 32.6 pg (ref 26.6–33.0)
MCHC: 31.9 g/dL (ref 31.5–35.7)
MCV: 102 fL — ABNORMAL HIGH (ref 79–97)
Monocytes Absolute: 0.7 x10E3/uL (ref 0.1–0.9)
Monocytes: 9 %
Neutrophils Absolute: 4.4 x10E3/uL (ref 1.4–7.0)
Neutrophils: 57 %
Platelets: 223 x10E3/uL (ref 150–450)
RBC: 3.77 x10E6/uL (ref 3.77–5.28)
RDW: 12.5 % (ref 11.7–15.4)
WBC: 7.7 x10E3/uL (ref 3.4–10.8)

## 2024-03-14 LAB — COMPREHENSIVE METABOLIC PANEL WITH GFR
ALT: 17 IU/L (ref 0–32)
AST: 18 IU/L (ref 0–40)
Albumin: 4.1 g/dL (ref 3.8–4.8)
Alkaline Phosphatase: 78 IU/L (ref 49–135)
BUN/Creatinine Ratio: 36 — ABNORMAL HIGH (ref 12–28)
BUN: 27 mg/dL (ref 8–27)
Bilirubin Total: 0.2 mg/dL (ref 0.0–1.2)
CO2: 25 mmol/L (ref 20–29)
Calcium: 9.7 mg/dL (ref 8.7–10.3)
Chloride: 105 mmol/L (ref 96–106)
Creatinine, Ser: 0.75 mg/dL (ref 0.57–1.00)
Globulin, Total: 2.1 g/dL (ref 1.5–4.5)
Glucose: 89 mg/dL (ref 70–99)
Potassium: 4.2 mmol/L (ref 3.5–5.2)
Sodium: 142 mmol/L (ref 134–144)
Total Protein: 6.2 g/dL (ref 6.0–8.5)
eGFR: 85 mL/min/1.73 (ref >59)

## 2024-03-14 LAB — LIPID PANEL W/O CHOL/HDL RATIO
Cholesterol, Total: 243 mg/dL — ABNORMAL HIGH (ref 100–199)
HDL: 69 mg/dL (ref >39)
LDL Chol Calc (NIH): 131 mg/dL — ABNORMAL HIGH (ref 0–99)
Triglycerides: 248 mg/dL — ABNORMAL HIGH (ref 0–149)
VLDL Cholesterol Cal: 43 mg/dL — ABNORMAL HIGH (ref 5–40)

## 2024-03-18 ENCOUNTER — Other Ambulatory Visit: Payer: Self-pay | Admitting: Orthopedic Surgery

## 2024-03-18 DIAGNOSIS — M48062 Spinal stenosis, lumbar region with neurogenic claudication: Secondary | ICD-10-CM | POA: Diagnosis not present

## 2024-03-18 DIAGNOSIS — M5136 Other intervertebral disc degeneration, lumbar region with discogenic back pain only: Secondary | ICD-10-CM | POA: Diagnosis not present

## 2024-03-18 DIAGNOSIS — M4807 Spinal stenosis, lumbosacral region: Secondary | ICD-10-CM

## 2024-03-22 ENCOUNTER — Ambulatory Visit
Admission: RE | Admit: 2024-03-22 | Discharge: 2024-03-22 | Disposition: A | Discharge status: Home / Self Care | Source: Ambulatory Visit | Attending: Orthopedic Surgery | Admitting: Orthopedic Surgery

## 2024-03-22 DIAGNOSIS — M47816 Spondylosis without myelopathy or radiculopathy, lumbar region: Secondary | ICD-10-CM | POA: Diagnosis not present

## 2024-03-22 DIAGNOSIS — M48061 Spinal stenosis, lumbar region without neurogenic claudication: Secondary | ICD-10-CM | POA: Diagnosis not present

## 2024-03-22 DIAGNOSIS — M2548 Effusion, other site: Secondary | ICD-10-CM | POA: Diagnosis not present

## 2024-03-22 DIAGNOSIS — M4807 Spinal stenosis, lumbosacral region: Secondary | ICD-10-CM | POA: Diagnosis not present

## 2024-03-31 DIAGNOSIS — M48062 Spinal stenosis, lumbar region with neurogenic claudication: Secondary | ICD-10-CM | POA: Diagnosis not present

## 2024-03-31 DIAGNOSIS — M1611 Unilateral primary osteoarthritis, right hip: Secondary | ICD-10-CM | POA: Diagnosis not present

## 2024-03-31 DIAGNOSIS — M47816 Spondylosis without myelopathy or radiculopathy, lumbar region: Secondary | ICD-10-CM | POA: Diagnosis not present

## 2024-03-31 DIAGNOSIS — M5416 Radiculopathy, lumbar region: Secondary | ICD-10-CM | POA: Diagnosis not present

## 2024-04-17 DIAGNOSIS — M48062 Spinal stenosis, lumbar region with neurogenic claudication: Secondary | ICD-10-CM | POA: Diagnosis not present

## 2024-04-17 DIAGNOSIS — M5416 Radiculopathy, lumbar region: Secondary | ICD-10-CM | POA: Diagnosis not present

## 2024-09-12 ENCOUNTER — Encounter: Admitting: Family Medicine

## 2024-12-16 ENCOUNTER — Ambulatory Visit
